# Patient Record
Sex: Female | Born: 1980
Health system: Southern US, Community
[De-identification: ages and names within clinical notes are randomized; demographics above are authoritative.]

## PROBLEM LIST (undated history)

## (undated) DIAGNOSIS — R200 Anesthesia of skin: Secondary | ICD-10-CM

## (undated) DIAGNOSIS — F32A Depression, unspecified: Secondary | ICD-10-CM

## (undated) DIAGNOSIS — D219 Benign neoplasm of connective and other soft tissue, unspecified: Secondary | ICD-10-CM

## (undated) DIAGNOSIS — G35D Multiple sclerosis, unspecified: Secondary | ICD-10-CM

## (undated) DIAGNOSIS — K219 Gastro-esophageal reflux disease without esophagitis: Secondary | ICD-10-CM

## (undated) DIAGNOSIS — R5381 Other malaise: Secondary | ICD-10-CM

## (undated) DIAGNOSIS — F329 Major depressive disorder, single episode, unspecified: Secondary | ICD-10-CM

## (undated) DIAGNOSIS — F419 Anxiety disorder, unspecified: Secondary | ICD-10-CM

## (undated) DIAGNOSIS — N83209 Unspecified ovarian cyst, unspecified side: Secondary | ICD-10-CM

## (undated) DIAGNOSIS — I517 Cardiomegaly: Secondary | ICD-10-CM

## (undated) DIAGNOSIS — N921 Excessive and frequent menstruation with irregular cycle: Secondary | ICD-10-CM

## (undated) DIAGNOSIS — G35 Multiple sclerosis: Secondary | ICD-10-CM

## (undated) DIAGNOSIS — E669 Obesity, unspecified: Secondary | ICD-10-CM

## (undated) DIAGNOSIS — IMO0001 Reserved for inherently not codable concepts without codable children: Secondary | ICD-10-CM

## (undated) DIAGNOSIS — I38 Endocarditis, valve unspecified: Secondary | ICD-10-CM

## (undated) DIAGNOSIS — R7989 Other specified abnormal findings of blood chemistry: Secondary | ICD-10-CM

## (undated) DIAGNOSIS — R5383 Other fatigue: Secondary | ICD-10-CM

## (undated) DIAGNOSIS — R1110 Vomiting, unspecified: Secondary | ICD-10-CM

## (undated) HISTORY — DX: Excessive and frequent menstruation with irregular cycle: N92.1

## (undated) HISTORY — PX: LUMBAR DISC SURGERY: SHX700

## (undated) HISTORY — DX: Reserved for inherently not codable concepts without codable children: IMO0001

## (undated) HISTORY — DX: Other specified abnormal findings of blood chemistry: R79.89

## (undated) HISTORY — DX: Benign neoplasm of connective and other soft tissue, unspecified: D21.9

## (undated) HISTORY — DX: Anesthesia of skin: R20.0

## (undated) HISTORY — DX: Multiple sclerosis, unspecified: G35.D

## (undated) HISTORY — DX: Other malaise: R53.81

## (undated) HISTORY — DX: Multiple sclerosis: G35

## (undated) HISTORY — DX: Vomiting, unspecified: R11.10

## (undated) HISTORY — DX: Gastro-esophageal reflux disease without esophagitis: K21.9

## (undated) HISTORY — DX: Obesity, unspecified: E66.9

## (undated) HISTORY — DX: Unspecified ovarian cyst, unspecified side: N83.209

## (undated) HISTORY — DX: Other fatigue: R53.83

---

## 2001-10-01 ENCOUNTER — Emergency Department (HOSPITAL_COMMUNITY): Admission: EM | Admit: 2001-10-01 | Discharge: 2001-10-01 | Payer: Self-pay | Admitting: *Deleted

## 2002-03-12 ENCOUNTER — Encounter: Payer: Self-pay | Admitting: Emergency Medicine

## 2002-03-12 ENCOUNTER — Emergency Department (HOSPITAL_COMMUNITY): Admission: EM | Admit: 2002-03-12 | Discharge: 2002-03-12 | Payer: Self-pay | Admitting: Emergency Medicine

## 2002-03-27 ENCOUNTER — Encounter: Payer: Self-pay | Admitting: Obstetrics & Gynecology

## 2002-03-27 ENCOUNTER — Inpatient Hospital Stay (HOSPITAL_COMMUNITY): Admission: EM | Admit: 2002-03-27 | Discharge: 2002-03-28 | Payer: Self-pay | Admitting: *Deleted

## 2002-05-01 ENCOUNTER — Ambulatory Visit (HOSPITAL_COMMUNITY): Admission: RE | Admit: 2002-05-01 | Discharge: 2002-05-01 | Payer: Self-pay | Admitting: Neurology

## 2002-05-14 ENCOUNTER — Ambulatory Visit (HOSPITAL_COMMUNITY): Admission: RE | Admit: 2002-05-14 | Discharge: 2002-05-14 | Payer: Self-pay | Admitting: Neurology

## 2002-05-26 ENCOUNTER — Encounter: Admission: RE | Admit: 2002-05-26 | Discharge: 2002-08-24 | Payer: Self-pay | Admitting: Neurology

## 2002-06-16 ENCOUNTER — Emergency Department (HOSPITAL_COMMUNITY): Admission: EM | Admit: 2002-06-16 | Discharge: 2002-06-16 | Payer: Self-pay | Admitting: Emergency Medicine

## 2002-07-11 ENCOUNTER — Emergency Department (HOSPITAL_COMMUNITY): Admission: EM | Admit: 2002-07-11 | Discharge: 2002-07-12 | Payer: Self-pay | Admitting: Emergency Medicine

## 2002-08-25 ENCOUNTER — Encounter: Admission: RE | Admit: 2002-08-25 | Discharge: 2002-10-09 | Payer: Self-pay | Admitting: Neurology

## 2003-02-16 ENCOUNTER — Emergency Department (HOSPITAL_COMMUNITY): Admission: EM | Admit: 2003-02-16 | Discharge: 2003-02-16 | Payer: Self-pay | Admitting: Emergency Medicine

## 2003-02-16 ENCOUNTER — Encounter: Payer: Self-pay | Admitting: Emergency Medicine

## 2003-02-18 ENCOUNTER — Emergency Department (HOSPITAL_COMMUNITY): Admission: EM | Admit: 2003-02-18 | Discharge: 2003-02-18 | Payer: Self-pay | Admitting: *Deleted

## 2003-04-27 ENCOUNTER — Encounter: Payer: Self-pay | Admitting: Neurology

## 2003-04-27 ENCOUNTER — Ambulatory Visit: Admission: RE | Admit: 2003-04-27 | Discharge: 2003-04-27 | Payer: Self-pay | Admitting: Neurology

## 2004-10-23 HISTORY — PX: BACK SURGERY: SHX140

## 2004-10-23 HISTORY — PX: CYST EXCISION: SHX5701

## 2005-09-19 ENCOUNTER — Emergency Department (HOSPITAL_COMMUNITY): Admission: EM | Admit: 2005-09-19 | Discharge: 2005-09-19 | Payer: Self-pay | Admitting: Emergency Medicine

## 2005-10-10 ENCOUNTER — Ambulatory Visit (HOSPITAL_COMMUNITY): Admission: RE | Admit: 2005-10-10 | Discharge: 2005-10-10 | Payer: Self-pay | Admitting: Neurosurgery

## 2005-11-07 ENCOUNTER — Encounter (HOSPITAL_COMMUNITY): Admission: RE | Admit: 2005-11-07 | Discharge: 2005-12-07 | Payer: Self-pay | Admitting: Neurosurgery

## 2006-07-20 ENCOUNTER — Emergency Department (HOSPITAL_COMMUNITY): Admission: EM | Admit: 2006-07-20 | Discharge: 2006-07-21 | Payer: Self-pay | Admitting: Emergency Medicine

## 2007-12-26 ENCOUNTER — Ambulatory Visit (HOSPITAL_COMMUNITY): Admission: RE | Admit: 2007-12-26 | Discharge: 2007-12-26 | Payer: Self-pay | Admitting: Family Medicine

## 2008-02-25 ENCOUNTER — Ambulatory Visit (HOSPITAL_COMMUNITY): Admission: RE | Admit: 2008-02-25 | Discharge: 2008-02-25 | Payer: Self-pay | Admitting: Neurology

## 2008-07-21 ENCOUNTER — Encounter (HOSPITAL_COMMUNITY): Admission: RE | Admit: 2008-07-21 | Discharge: 2008-08-20 | Payer: Self-pay | Admitting: Family Medicine

## 2008-07-28 ENCOUNTER — Ambulatory Visit: Payer: Self-pay | Admitting: Gastroenterology

## 2008-08-05 ENCOUNTER — Ambulatory Visit (HOSPITAL_COMMUNITY): Admission: RE | Admit: 2008-08-05 | Discharge: 2008-08-05 | Payer: Self-pay | Admitting: Gastroenterology

## 2008-08-05 ENCOUNTER — Encounter: Payer: Self-pay | Admitting: Gastroenterology

## 2008-08-05 ENCOUNTER — Ambulatory Visit: Payer: Self-pay | Admitting: Gastroenterology

## 2008-08-05 HISTORY — PX: ESOPHAGOGASTRODUODENOSCOPY: SHX1529

## 2008-09-21 ENCOUNTER — Ambulatory Visit (HOSPITAL_COMMUNITY): Admission: RE | Admit: 2008-09-21 | Discharge: 2008-09-21 | Payer: Self-pay | Admitting: General Surgery

## 2008-09-21 ENCOUNTER — Encounter (INDEPENDENT_AMBULATORY_CARE_PROVIDER_SITE_OTHER): Payer: Self-pay | Admitting: General Surgery

## 2008-09-23 ENCOUNTER — Ambulatory Visit: Payer: Self-pay | Admitting: Gastroenterology

## 2008-10-23 HISTORY — PX: CHOLECYSTECTOMY: SHX55

## 2010-09-01 ENCOUNTER — Ambulatory Visit: Payer: Self-pay | Admitting: Gastroenterology

## 2010-09-01 ENCOUNTER — Encounter (INDEPENDENT_AMBULATORY_CARE_PROVIDER_SITE_OTHER): Payer: Self-pay

## 2010-09-01 DIAGNOSIS — K219 Gastro-esophageal reflux disease without esophagitis: Secondary | ICD-10-CM

## 2010-09-01 DIAGNOSIS — K5909 Other constipation: Secondary | ICD-10-CM | POA: Insufficient documentation

## 2010-09-07 ENCOUNTER — Encounter: Payer: Self-pay | Admitting: Gastroenterology

## 2010-09-14 ENCOUNTER — Ambulatory Visit (HOSPITAL_COMMUNITY): Admission: RE | Admit: 2010-09-14 | Discharge: 2010-09-14 | Payer: Self-pay | Admitting: Gastroenterology

## 2010-09-14 ENCOUNTER — Ambulatory Visit: Payer: Self-pay | Admitting: Gastroenterology

## 2010-09-14 HISTORY — PX: COLONOSCOPY: SHX174

## 2010-09-16 ENCOUNTER — Encounter: Admission: RE | Admit: 2010-09-16 | Discharge: 2010-09-16 | Payer: Self-pay | Admitting: Neurology

## 2010-11-22 NOTE — Letter (Signed)
Summary: TCS ORDER  TCS ORDER   Imported By: Ave Filter 09/07/2010 14:36:49  _____________________________________________________________________  External Attachment:    Type:   Image     Comment:   External Document

## 2010-11-22 NOTE — Miscellaneous (Signed)
Summary: procedure note and path  Clinical Lists Changes NAME:  [name], [name]             ACCOUNT NO.:  [id number]      MEDICAL RECORD NO.:  [id number]          PATIENT TYPE:  AMB      LOCATION:  DAY                           FACILITY:  [facility]      PHYSICIAN:  [name], M.D.      DATE OF BIRTH:  1981-10-17      DATE OF PROCEDURE:  08/05/2008   DATE OF DISCHARGE:                                  OPERATIVE REPORT     REFERRING PHYSICIAN:  Patrica Duel, [name]      PROCEDURE:  Esophagogastroduodenoscopy with cold forceps biopsy.      INDICATIONS FOR EXAM:  [name] is a 30 year old female who came in   with complaints of uncontrolled gastroesophageal reflux disease.  She   has intermittently been on a PPI and has been using over-the-counter [employer] brand medication to try and control her symptoms.  She complains of   having heartburn, so severe that she will vomit.  She denies any   problems with swallowing.  She only has problems with nausea and   vomiting when she is having a bad episode of reflux.  She denies any   constipation or diarrhea.  She does not use aspirin, BCs, Goody powders,   ibuprofen, or Aleve.  She uses ibuprofen less than once a month.      FINDINGS:   1. Three linear erosions seen in the distal esophagus extending from       the gastroesophageal junction proximally.  The erosions were less       than 3 mm. She did have an incomplete Schatzki ring.   2. Small hiatal hernia.   3. Patchy erythema in the antrum with two erosions.  Biopsies obtained       via cold forceps to evaluate for Helicobacter pylori gastritis.   4. Streaky erythema in the duodenal bulb.  No ulcerations.   5. Normal ampulla and second portion of the duodenum.      DIAGNOSES:   1. Small hiatal hernia.   2. Erosive esophagitis (mild).   3. Gastritis, mild.   4. Duodenitis, mild.      RECOMMENDATIONS:   1. She should follow the [facility] lifestyle recommendations.  She       is  given a handout and that the handout was explained prior to her       sedation.   2. She should follow a low-fat diet.  I did emphasize to her that she       needs to lose at least 10-20 pounds.   3. Follow up appointment in 6 weeks with [name] regarding her       reflux.   4. Will call her with the results of her biopsies.   5. She is to take Prilosec 20 mg 30 minutes prior to breakfast and       supper.   6. No aspirin or NSAIDs for 30 days.   7. No anticoagulation for 5 days.   8. She  is given information on hiatal hernia.      MEDICATIONS:   1. Demerol 100 mg IV.   2. Versed 6 mg IV.      PROCEDURE TECHNIQUE:  Physical exam was performed.  Informed consent was   obtained from the patient after explaining the benefits, risks, and   alternatives to the procedure.  The patient was connected to monitor and   placed in left lateral position.  Continuous oxygen was provided by   nasal cannula.  IV medicine administered through an indwelling cannula.   After administration of sedation, the patient's esophagus was intubated   and the scope was advanced under direct visualization to the second   portion of the duodenum.  The scope was removed slowly by carefully   examining the color, texture, anatomy, and integrity of mucosa on the   way out.  The patient was recovered in endoscopy and discharged to home   in satisfactory condition.      PATH:  Benign gastric mucosa.               [name], M.D.   Electronically Signed            SM/MEDQ  D:  08/05/2008  T:  08/06/2008  Job:  [id number]      cc:   [name], M.D.   Fax: [phone]     SP-Surgical Pathology - STATUS: Final  .                                         Perform Date: 14Oct09 00:01  Ordered By: [name],          Ordered Date: 14Oct09 13:20  Facility: [facility]                               Department: CPATH  Service Report Text  [facility]   [address] [place],  [place] [address]   [phone]    REPORT OF SURGICAL PATHOLOGY    Case #: [id number]   Patient Name: [name], [name]   Office Chart Number: N/A    MRN: [id number]   Pathologist: Jessica Server A. Delila Spence, [name]   DOB/Age [birth year] (Age: 30) Gender: F   Date Taken: 08/05/2008   Date Received: 08/05/2008    FINAL DIAGNOSIS    ***MICROSCOPIC EXAMINATION AND DIAGNOSIS***    STOMACH, BIOPSY:   - BENIGN GASTRIC MUCOSA WITH A FEW SUPERFICIAL HEMORRHAGES   IN LAMINA PROPRIA.   - NO ACTIVE MUCOSAL INFLAMMATION, INTESTINAL METAPLASIA,   DYSPLASIA, OR MALIGNANCY   IDENTIFIED.   - NO HELICOBACTER PYLORI ORGANISMS IDENTIFIED.    COMMENT   A Warthin-Starry stain is performed to determine the possibility   of the presence of Helicobacter pylori. The Warthin-Starry stain   is negative for organisms of Helicobacter pylori. The control(s)   stained appropriately. (EA:jy) 08/06/08    jy   Date Reported: 08/06/2008 Jessica Server A. Delila Spence, [name]   *** Electronically Signed Out By EAA ***    Clinical information   H pylori; gastritis ? (km)    specimen(s) obtained   Stomach, biopsy    Gross Description   Received in formalin are tan, soft tissue fragments that are   submitted in toto. Number: five   Size: 0.2 cm up  to 0.4 cm (SP:mw, 08/05/08)    mw/   Additional Information  HL7 RESULT STATUS : F  External IF Update Timestamp : 2008-08-05:13:22:00.000000

## 2010-11-22 NOTE — Letter (Signed)
Summary: EGD ORDER  EGD ORDER   Imported By: Ave Filter 09/01/2010 11:33:20  _____________________________________________________________________  External Attachment:    Type:   Image     Comment:   External Document

## 2010-11-24 NOTE — Assessment & Plan Note (Signed)
Summary: CONSULT FOR EGD/SS   Visit Type:  Initial Consult Referring Provider:  Phillips Odor Primary Care Provider:  Phillips Odor  Chief Complaint:  Consult for EGD.  History of Present Illness: [name] is a pleasant 30 y/o WM, patient of Dr. Phillips Odor, who presents for consideration of EGD. We last saw her in 2009. She had EGD at that time, see PMH. She has NO recollection of having prior EGD. She states she took Nexium chronically without good control of her symptoms and eventually she lost her Medicaid and RX coverage and had to stop the med. She has been off PPI for one year. She has been taking ranitidine 2-3 per day and TUMS without relief.   Lots of heartburn with meals, even with water. Bad nocturnal symptoms, has to sit up while sleeping. No dysphagia. When belch, some regurgitation. No abd pain. BM once every two weeks. No meds. Recently started on 1200 cal diet and stools somewhat better. No melena. Some brbpr with straining, on tissue. Just started omeprazole 3 days ago, slight improvement.    Current Medications (verified): 1)  Omeprazole 40 Mg Cpdr (Omeprazole) .... Take 1 Tablet By Mouth Two Times A Day 2)  Copaxone 20 Mg/ml Kit (Glatiramer Acetate) .... One Injection Daily  Allergies (verified): 1)  ! Darvocet  Past History:  Past Medical History: EGD, 10/09, Dr. Darrick Penna Small hiatal hernia.  Erosive esophagitis (mild). Incomplete Schatzki's ring. Gastritis, mild.     Duodenitis, mild.  MS, dx 2003, ongoing blurred vision, numbness in right leg, hands  Past Surgical History: Cholecystectomy, 11/09, biliary dyskinesia Back surgery, 12/06 C-section  Family History: Significant for grandmother, deceased, history of uterine, lung, and colon cancer.  Colon cancer diagnosed at age 75.  Her father, age 53, has diabetes and history of MI (5).      Social History: She is single.  She has a son.  She is unemployed/quit one year ago due to MS. Trying to get disability.  She  smokes about half-a-pack to one ppd cigarettes daily. She denies any alcohol use or drug use.      Review of Systems General:  Denies fever, chills, sweats, anorexia, fatigue, weakness, and weight loss. Eyes:  Denies vision loss. ENT:  Denies sore throat, hoarseness, and difficulty swallowing. CV:  Denies chest pains, angina, palpitations, dyspnea on exertion, and peripheral edema. Resp:  Denies dyspnea at rest, dyspnea with exercise, cough, sputum, and wheezing. GI:  See HPI. GU:  Denies urinary burning and blood in urine. MS:  Denies joint pain / LOM and low back pain. Derm:  Denies rash and itching. Neuro:  Complains of abnormal sensation; denies weakness, frequent headaches, memory loss, and confusion. Psych:  Denies depression and anxiety. Endo:  Denies unusual weight change. Heme:  Denies bruising and bleeding. Allergy:  Denies hives and rash.  Vital Signs:  Patient profile:   30 year old female Height:      69 inches Weight:      254 pounds BMI:     37.64 Temp:     98.5 degrees F oral Pulse rate:   76 / minute BP sitting:   100 / 80  (left arm) Cuff size:   large  Vitals Entered By: Cloria Spring LPN (September 01, 2010 10:43 AM)  Physical Exam  General:  Well developed, well nourished, no acute distress.obese.   Head:  Normocephalic and atraumatic. Eyes:  Conjunctivae pink, no scleral icterus.  Mouth:  Oropharyngeal mucosa moist, pink.  No lesions, erythema or  exudate.    Neck:  Supple; no masses or thyromegaly. Lungs:  Clear throughout to auscultation. Heart:  Regular rate and rhythm; no murmurs, rubs,  or bruits. Abdomen:  Obese. Positive BS. Soft. NT. No HSM or masses. No abd bruit or hernia.  Extremities:  No clubbing, cyanosis, edema or deformities noted. Neurologic:  Alert and  oriented x4;  grossly normal neurologically. Skin:  Intact without significant lesions or rashes. Cervical Nodes:  No significant cervical adenopathy. Psych:  Alert and cooperative.  Normal mood and affect.  Impression & Recommendations:  Problem # 1:  GERD (ICD-530.81)  Chronic GERD with h/o erosive reflux esophagitis on prior EGD. H.Pylori negataive on bx then and at PCP recently. Symptoms refractory to Nexium before, ranitidine, TUMS. She was not aware of previous EGD. We discussed options of ongoing PPI therapy and see where her symptoms are in next one month but given her previous failure to Nexium already, she desires repeat EGD at this time. EGD to be performed in near future.  Risks, alternatives, benefits including but not limited to risk of reaction to medications, bleeding, infection, and perforation addressed.  Patient voiced understanding and verbal consent obtained.   Orders: Consultation Level III (16109)  Problem # 2:  CONSTIPATION, CHRONIC (ICD-564.09)  Encourage increased dietary fiber, water consumption, daily excercise. She may begin fiber supplement and stool softner (colace or miralax). Patient wants to try and control with dietary measures only at this time. Suspect occasional brbpr on toilet tissue due to straining (benign anorecal source).   Orders: Consultation Level III (60454) I would like to thank Dr. Assunta Found for allowing [place] to take part in the care of this nice patient.   Appended Document: CONSULT FOR EGD/SS Recent EGD 2009. She stopped her PPI, gained weight, and continues to smoke. No need for EGD. Pt needs to modify her risk factors: lose weight, stop smoking, follow a low fat diet, and take her PPI 30 minutes prior to meals two times a day. Called pt's home to discuss & will call her cell-[phone].  Appended Document: CONSULT FOR EGD/SS call pt's alternative number-LVM. Would perform TCS for rectal bleeding.  Appended Document: CONSULT FOR EGD/SS I called pt to schedule tcs, per family member pt not home but she will be there around 10am.  Appended Document: CONSULT FOR EGD/SS Called pt. LVM w/ mother-call regarding her  procedure.  Appended Document: CONSULT FOR EGD/SS Spoke with pt. Explained pt does not need an EGD but should continue to work on lifestyle modification. Pt currently losing weight due to a 1200 calorie diet. Continues to smoke and is taking a PPI. Pt agreed to TCS and would like one prior to Thanksgiving. Requested the Osmoprep because she cannot drink the liquid. TCS for rectal bleeding on NOV 23. Contact pt at [phone].  Appended Document: CONSULT FOR EGD/SS Appt changed to tcs and instructions along with rx mailed to the pt..Pt contacted with the change.

## 2011-03-07 NOTE — Consult Note (Signed)
[name], [name]             ACCOUNT NO.:  [id number]   MEDICAL RECORD NO.:  [id number]          PATIENT TYPE:  AMB   LOCATION:  DAY                           FACILITY:  [facility]   PHYSICIAN:  Kassie Mends, M.D.      DATE OF BIRTH:  05/31/1981   DATE OF CONSULTATION:  DATE OF DISCHARGE:                                 CONSULTATION   PHYSICIAN REQUESTING CONSULTATION:  Patrica Duel, MD   REASON FOR CONSULTATION:  Indigestion and nausea.   HISTORY OF PRESENT ILLNESS:  Ms. Foulk is a very pleasant 30 year old  obese Caucasian female who presents today for a 9-year history of  indigestion, heartburn, and nausea.  She states ever since she was  pregnant with her son, she has been having daily heartburn, nocturnal  reflux, nausea, and intermittent vomiting.  This has been going on for  about 9 years.  She has used various over-the-counter agents.  She  states she took Nexium for about 4-5 days once and did not seemed to  help.  She also took another PPI given to her by Dr. Nobie Putnam, which she  can not remember the name.  She states that none of these have really  provided her with any relief.  She denies any abdominal pain, dysphagia,  or odynophagia.  Her bowel movements are irregular, usually twice per  week.  Denies any blood in the stool or melena.  Her weight has been  stable.  She recently had a gallbladder workup including abdominal  ultrasound, which was unremarkable.  She had a HIDA scan, which revealed  a mildly decreased gallbladder ejection fraction of 34.8%.  She,  however, did not experience any symptoms; however, the only symptom she  experiences after ingestion of having half was that of indigestion and  heartburn.  She tells me that they are planning on taking her  gallbladder out.   CURRENT MEDICATIONS:  Chantix daily and Copaxone injections daily for  MS.   ALLERGIES:  DARVOCET.   PAST MEDICAL HISTORY:  Multiple sclerosis diagnosed in 2003.   PAST SURGICAL  HISTORY:  She has had a back surgery, cesarean section,  and cyst removed.   FAMILY HISTORY:  Significant for grandmother who has had a history of  uterine, lung, and colon cancer.  Colon cancer diagnosed at age 39.  Her  father has diabetes and history of MI.   SOCIAL HISTORY:  She is single.  She has a son.  She is unemployed.  She  smokes about half-a-pack cigarettes daily, cutting back from 2 packs  daily recently.  She denies any alcohol use.   REVIEW OF SYSTEMS:  GI:  See HPI.  CONSTITUTIONAL:  Denies any weight  loss.  CARDIOPULMONARY:  Denies chest pain, shortness of breath,  palpitations, or cough.  GENITOURINARY:  Denies dysuria or hematuria.   PHYSICAL EXAMINATION:  VITAL SIGNS:  Weight 240, height 5 feet 9, temp  97.9, blood pressure 108/80, pulse 60.  GENERAL:  Pleasant, obese Caucasian female in no acute distress.  SKIN:  Warm and dry.  No jaundice.  HEENT:  Sclerae nonicteric.  Oropharyngeal mucosa moist and pink.  No  lesions, erythema, or exudate.  No lymphadenopathy or thyromegaly.  CHEST:  Lungs are clear to auscultation.  CARDIAC:  Regular rate and rhythm.  Normal S1 and S2.  No murmurs, rubs,  or gallops.  ABDOMEN:  Positive bowel sounds.  Abdomen soft, nontender, and  nondistended.  No organomegaly or masses.  No rebound or guarding.  No  abdominal bruits or hernias.  LOWER EXTREMITIES:  No edema.   IMPRESSION:  The patient is a very pleasant 30 year old lady with a 9-  year history of indigestion, heartburn, and nausea with intermittent  vomiting.  Symptoms are likely related to chronic gastroesophageal  reflux disease.  She also has mild constipation.  The patient tells me  that she will be having her gallbladder removed in the near future.  I  did advise her that I would recommend upper endoscopy prior to that.  It  may be that most of her symptoms are related to her reflux, and she may  be able to postpone cholecystectomy for now.   PLAN:  1. EGD in  the near future with Dr. Cira Servant.  2. MiraLax 17 g daily as needed.  3. Prilosec 20 mg p.o. daily, 14 samples provided.   I would like to thank Dr. Nobie Putnam for allowing [place] to take part in the  care of this patient.      Tana Coast, P.A.      Kassie Mends, M.D.  Electronically Signed    LL/MEDQ  D:  07/28/2008  T:  07/29/2008  Job:  [id number]   cc:   Dorita Fray., M.D.  Fax: [phone]   Patrica Duel, M.D.  Fax: [phone]

## 2011-03-07 NOTE — H&P (Signed)
NAMESUSSAN, METER             ACCOUNT NO.:  [id number]   MEDICAL RECORD NO.:  [id number]          PATIENT TYPE:  AMB   LOCATION:  DAY                           FACILITY:  [facility]   PHYSICIAN:  Tilford Pillar, MD      DATE OF BIRTH:  20-Aug-1981   DATE OF ADMISSION:  DATE OF DISCHARGE:  LH                              HISTORY & PHYSICAL   CHIEF COMPLAINT:  Right upper quadrant abdominal pain.   HISTORY OF PRESENT ILLNESS:  The patient is a 30 year old female with a  history of multiple sclerosis and a history of gastroesophageal reflux  disease, who presented to my office approximately 1 month ago with a  history of right upper quadrant abdominal pain.  Her symptoms increased  after eating, although she does have poor correlation with fatty greasy  foods.  There is no particular food that does seem to increase the pain.  The pain is intermittent and occasionally retrosternal in distribution.  She has had no history of fever or chills.  No nausea and vomiting.  No  history of jaundice.  The patient does state she has had a previous  upper endoscopy, which demonstrated hiatal hernia.  No evidence of any  ulcerations were noted at that time.  She was also started on Prilosec  at that time.  No significant family history.   PAST MEDICAL HISTORY:  History of TMJ, gastroesophageal reflux disease.  The patient has had a history of multiple sclerosis.   PAST SURGICAL HISTORY:  Cesarean section.  The patient has had previous  back surgery and the patient has had excision of a pilonidal cyst.   MEDICATION:  Prilosec.   ALLERGIES:  DARVOCET.   SOCIAL HISTORY:  She is a half a pack per day smoker.  No alcohol use.  No recreational drug use.   REVIEW OF SYSTEMS:  GENERAL:  Occasional headaches.  EYES:  Unremarkable.  EARS, NOSE, AND THROAT:  Occasional rhinorrhea and  sinusitis. PULMONARY:  Unremarkable.  CARDIOVASCULAR:  Unremarkable.  GASTROINTESTINAL:  As per HPI, otherwise  unremarkable.  GENITOURINARY:  Unremarkable.  MUSCULOSKELETAL:  Arthralgias of the back.  ENDOCRINE:  Unremarkable.  NEURO:  Unremarkable.  SKIN:  Unremarkable.   PHYSICAL EXAMINATION:  GENERAL:  The patient is an obese female.  She is  not in any acute distress.  She is alert and oriented x3.  HEENT:  Scalp; no deformities, no masses.  Pupils are equal, round, and  reactive.  Extraocular movements are intact.  No conjunctival pallor or  scleral icterus is noted.  Oral mucosa is pink.  Normal occlusion.  NECK:  Trachea is midline.  No cervical lymphadenopathy.  PULMONARY:  Unlabored respirations.  She is clear to auscultation  bilaterally.  No wheezes.  No crackles are apparent.  CARDIOVASCULAR:  Regular rate and rhythm.  No murmurs.  No gallops are apparent.  She has  2+ radial pulses bilaterally.  ABDOMEN:  Her abdomen is obese, soft.  No  masses.  She does have some mild epigastric abdominal pain.  No Eulah Pont  sign is elicited.  EXTREMITIES:  Warm and dry.  PERTINENT LABORATORY AND RADIOGRAPHIC STUDIES:  The patient did have a  previous right upper quadrant ultrasound, which was unremarkable.  She  had above-mentioned upper endoscopy, which demonstrated a hiatal hernia.  Additionally, the patient did have a HIDA scan demonstrating a biliary  ejection fraction of 34.8%.  With the scan, the patient did have some  nausea with the ingestion of the oral cream; however, no exacerbation of  her pain symptoms occurred at that time.   ASSESSMENT AND PLAN:  Biliary dyskinesia.  At this time, I had a long  discussion with the patient regarding her symptomatology.  I do feel  that the majority of her symptoms are likely biliary in nature, although  her HIDA scan was borderline from an ejection fracture standpoint and  did not exacerbate all of her symptomatology.  Based on that however, I  did discuss with the patient other etiologies of her pain.  She has  already had endoscopy, which did not  demonstrate any gastrointestinal  etiology.  She has been on a course of Prilosec with no improvement of  her symptoms.  From the standpoint of right upper quadrant pain, she has  had some resolution of her reflux with the Prilosec and I recommended  continuing on that.  I did discuss again with the patient the risks,  benefits, alternatives of laparoscopic possible open cholecystectomy  including, but not limited to risk of bleeding, infection, bile leak,  small-bowel injury, common bile duct injury as well as possibility of  intraoperative cardiac and pulmonary events.  At this time, the patient  will be scheduled at her earliest convenience.  I did discuss with the  patient that should her operation be scheduled after the Thanksgiving  holiday, to be extremely careful in her diet at the holiday as not to  exacerbate her symptoms.      Tilford Pillar, MD  Electronically Signed     BZ/MEDQ  D:  09/10/2008  T:  09/11/2008  Job:  [id number]   cc:   Patrica Duel, M.D.  Fax: [phone]

## 2011-03-07 NOTE — Op Note (Signed)
[name], [name]             ACCOUNT NO.:  [id number]   MEDICAL RECORD NO.:  [id number]          PATIENT TYPE:  AMB   LOCATION:  DAY                           FACILITY:  [facility]   PHYSICIAN:  Kassie Mends, M.D.      DATE OF BIRTH:  Jul 02, 1981   DATE OF PROCEDURE:  08/05/2008  DATE OF DISCHARGE:                               OPERATIVE REPORT   REFERRING PHYSICIAN:  Patrica Duel, MD   PROCEDURE:  Esophagogastroduodenoscopy with cold forceps biopsy.   INDICATIONS FOR EXAM:  [name] is a 30 year old female who came in  with complaints of uncontrolled gastroesophageal reflux disease.  She  has intermittently been on a PPI and has been using over-the-counter [employer] brand medication to try and control her symptoms.  She complains of  having heartburn, so severe that she will vomit.  She denies any  problems with swallowing.  She only has problems with nausea and  vomiting when she is having a bad episode of reflux.  She denies any  constipation or diarrhea.  She does not use aspirin, BCs, Goody powders,  ibuprofen, or Aleve.  She uses ibuprofen less than once a month.   FINDINGS:  1. Three linear erosions seen in the distal esophagus extending from      the gastroesophageal junction proximally.  The erosions were less      than 3 mm. She did have an incomplete Schatzki ring.  2. Small hiatal hernia.  3. Patchy erythema in the antrum with two erosions.  Biopsies obtained      via cold forceps to evaluate for Helicobacter pylori gastritis.  4. Streaky erythema in the duodenal bulb.  No ulcerations.  5. Normal ampulla and second portion of the duodenum.   DIAGNOSES:  1. Small hiatal hernia.  2. Erosive esophagitis (mild).  3. Gastritis, mild.  4. Duodenitis, mild.   RECOMMENDATIONS:  1. She should follow the [facility] lifestyle recommendations.  She      is given a handout and that the handout was explained prior to her      sedation.  2. She should follow a low-fat  diet.  I did emphasize to her that she      needs to lose at least 10-20 pounds.  3. Follow up appointment in 6 weeks with Dr. Cira Servant regarding her      reflux.  4. Will call her with the results of her biopsies.  5. She is to take Prilosec 20 mg 30 minutes prior to breakfast and      supper.  6. No aspirin or NSAIDs for 30 days.  7. No anticoagulation for 5 days.  8. She is given information on hiatal hernia.   MEDICATIONS:  1. Demerol 100 mg IV.  2. Versed 6 mg IV.   PROCEDURE TECHNIQUE:  Physical exam was performed.  Informed consent was  obtained from the patient after explaining the benefits, risks, and  alternatives to the procedure.  The patient was connected to monitor and  placed in left lateral position.  Continuous oxygen was provided by  nasal cannula.  IV  medicine administered through an indwelling cannula.  After administration of sedation, the patient's esophagus was intubated  and the scope was advanced under direct visualization to the second  portion of the duodenum.  The scope was removed slowly by carefully  examining the color, texture, anatomy, and integrity of mucosa on the  way out.  The patient was recovered in endoscopy and discharged to home  in satisfactory condition.   PATH:  Benign gastric mucosa.      Kassie Mends, M.D.  Electronically Signed     SM/MEDQ  D:  08/05/2008  T:  08/06/2008  Job:  [id number]   cc:   Patrica Duel, M.D.  Fax: [phone]

## 2011-03-07 NOTE — Assessment & Plan Note (Signed)
[name], [name]              CHART#:  [id number]   DATE:  09/23/2008                       DOB:  1981-10-10   PROBLEM LIST:  1. Uncontrolled gastroesophageal reflux disease.  2. Tobacco abuse.  3. Severe obesity.  4. Multiple sclerosis.   SUBJECTIVE:  The patient is a 30 year old female, who presents to return  patient visit.  She was initially seen in October 2009 complaining of 9-  year history of indigestion, heartburn, nausea, and intermittent  vomiting.  She was started on Prilosec once daily.  Her symptoms were  not controlled when she was seen and evaluated by upper endoscopy.  Also  of note, she was 234 pounds in September 2009.  Because of her abdominal  pain and her nausea a HIDA scan was performed in September 2009, which  showed a decreased gallbladder ejection fraction at 34.8%.  Her symptoms  were not reproduced with half-and-half.  She had an upper endoscopy in  October 2009, which showed erosive esophagitis and mild gastritis as  well as duodenitis.  She had a small hiatal hernia.  Biopsies of the  gastric mucosa showed no evidence of H. pylori infection.  The Prilosec  was increased to twice daily.  She reports that the Prilosec were for  about a week and then it stopped working.  She reports having epigastric  discomfort on a daily basis.  It usually happens in the evening.  It  does not appear to be related to eating and the pain is not worse with  food.  She did have her gallbladder taken out on Monday and the  pathology showed mild cholecystitis.  She continues to smoke a pack a  day, but is on Chantix and trying to quit.  She can definitely commit to  following a low-fat diet.  She denies any problems swallowing, nausea,  or vomiting.   MEDICATIONS:  1. Chantix.  2. Copaxone.  3. Prilosec b.i.d.   OBJECTIVE:  VITAL SIGNS:  Weight 247 pounds (up 13 pounds since  September 2009), height 5 feet 9 inches, BMI 36.5 (severely obese),  temperature 98,  blood pressure 104/72, and pulse 88.GENERAL:  She is in  no apparent distress, but moves slowly when getting on the exam table  due to her recent surgery.  She is alert and oriented x4.  HEENT:  Atraumatic and normocephalic.  Pupils equal and reactive to  light.  Mouth demonstrates a pierced tongue with a tongue ring in place.  Posterior pharynx without erythema or exudate.NECK:  Full range of  motion.  No lymphadenopathy.LUNGS:  Clear to auscultation  bilaterally.CARDIOVASCULAR:  Regular rhythm.  No murmur.  Normal S1 and  S2.ABDOMEN:  Bowel sounds are present, soft, and nondistended.  Incisions on her abdomen with Steri-Strips in place.  No evidence of  erythema.  She has mild tenderness to palpation in the right upper  quadrant and right lower quadrant without rebound or  guarding.NEUROLOGIC:  She has no focal neurologic deficits.   ASSESSMENT:  The patient is a 30 year old female with severe obesity,  tobacco abuse, and symptoms of reflux that are uncontrolled on b.i.d.  Prilosec.  She has multiple lifestyle factors that are exacerbating her  symptoms to include tobacco abuse, obesity, and likely  a high fat diet.  Thank you for allowing me to see the patient  in consultation.  My  recommendations as follow.   RECOMMENDATIONS:  1. She is given the [facility] lifestyle recommendations and handout      and asked to follow them.  She is also given a handout on low-fat      diet and asked to follow it.  I did emphasize to her that it is a      good thing that she should stop smoking and it will help her reflux      and that she should lose weight.  2. I have given her samples of Nexium #10 and a prescription for      Nexium.  She is to take 1 p.o. 30 minutes before breakfast and      supper.  She was given #60 with 5 refills.  3. Will add Carafate slurry 1 g.  She is to take 1 g p.o. every 6      hours as needed for heartburn and indigestion, #30 dose, refill x5.  4. Follow up  appointment in 2 months.       Kassie Mends, M.D.  Electronically Signed     SM/MEDQ  D:  09/23/2008  T:  09/23/2008  Job:  [id number]   cc:   Patrica Duel, M.D.

## 2011-03-07 NOTE — Op Note (Signed)
NAMEMARETA, CHESNUT             ACCOUNT NO.:  [id number]   MEDICAL RECORD NO.:  [id number]          PATIENT TYPE:  AMB   LOCATION:  DAY                           FACILITY:  [facility]   PHYSICIAN:  Tilford Pillar, MD      DATE OF BIRTH:  [birth year]   DATE OF PROCEDURE:  09/21/2008  DATE OF DISCHARGE:                               OPERATIVE REPORT   PREOPERATIVE DIAGNOSIS:  Biliary dyskinesia.   POSTOPERATIVE DIAGNOSIS:  Biliary dyskinesia.   PROCEDURE:  Laparoscopic cholecystectomy.   SURGEON:  Tilford Pillar, MD   ANESTHESIA:  General endotracheal, local anesthetic, 0.5% Sensorcaine  plain.   SPECIMEN:  Gallbladder.   ESTIMATED BLOOD LOSS:  Minimal.   INDICATIONS:  The patient is a 30 year old female who presented to my  office with a history of epigastric and right upper quadrant abdominal  pain.  Not all of her symptomatology did consist of symptoms consistent  with biliary origin.  She had been on a regimen of Prilosec with no real  improvement of all of her symptomatology as well as had upper endoscopy.  She also had a HIDA scan, which demonstrated a 34% ejection fracture,  while did not exacerbate all were symptoms, she did have a enough  symptomatology with her exam that I am suspicious that not necessarily  100% of her symptoms are related, I do feel great majority of her  symptoms are related to biliary etiology.  The risks, benefits, and  alternatives of a laparoscopic possible open cholecystectomy were  discussed at length with the patient including but not limited to risk  of bleeding, infection, bile leak, small bowel injury, bile duct injury,  as well as a possibility of intraoperative cardiac or pulmonary events.  The patient's questions and concerns were dressed.  The patient was  consented for planned procedure.   OPERATION:  The patient was taken to the operating room and was placed  in the supine position on the operating room table, at which time, the  general anesthetic was administered.  Once the patient was asleep, she  was endotracheally intubated by the Anesthesia.  At this point, her  abdomen was prepped with DuraPrep solution and draped in standard  fashion.  A stab incision was created supraumbilically with an 11-blade  scalpel.  Additional dissection down through the subcuticular tissue was  carried out using a Kocher clamp.  It was utilized to grasp the anterior  abdominal wall fascia which is anteriorly.  A Veress needle was  inserted.  Saline drop test was utilized to confirm intraperitoneal  placement and then pneumoperitoneum was initiated.  Once sufficient  pneumoperitoneum was obtained, an 11-mm trocar was inserted over the  laparoscope allowing visualization of the trocar entering into the  peritoneal cavity.  At this point, the inner cannula was removed.  The  laparoscope was reinserted.  There was no evidence of any trocar or  Veress needle placement injury.  At this time, the patient was placed in  a reverse Trendelenburg left lateral decubitus position.  The remaining  trocars were placed in a similar fashion using a stab incision  and  placement of the trocars under visualization.  An 11-mm trocar was  placed in the epigastrium.  A 5-mm trocar was placed in the midline  between two 5-mm trocars, and a 5-mm trocar was placed in the right  lateral abdominal wall.  At this point, the fundus of the gallbladder  was lifted up and over the right lobe of the liver.  The peritoneal  reflection onto the infundibulum was bluntly stripped using a Recruitment consultant.  This exposed the cystic duct.  The cystic duct could clearly  be seen entering into the infundibulum.  A window was created behind the  cystic duct.  Similarly, a window was created behind the cystic artery  which was also visualized at this time.  The remainder of the clips were  placed proximally on the cystic duct, 1 distally and the cystic duct was  divided  between 2 most distal clips.  Similarly, the cystic artery had 2  clips placed proximally and 1 distally and the cystic artery was divided  between 2 most distal clips.  At this time, electrocautery was utilized  to dissect the gallbladder free from the gallbladder fossa.  When it was  free, it was placed in EndoCatch bag and it was placed up and over the  right lobe of the liver.  Hemostasis was obtained in the gallbladder  fossa using the electrocautery.  At this time, a piece of Surgicel was  placed into the gallbladder fossa and the EndoClips were evaluated with  no evidence of any bleeding or bile leak from any of the EndoClips.   At this time, attention was turned to closure.  Using an Endoclose  suture passing device, a 2-0 Vicryl suture was passed above the 11-mm  trocar sites with these Vicryl sutures in place.  The gallbladder was  grasped and was removed through the epigastric trocar site intact in the  EndoCatch bag.  This was placed on the back-table and was sent as a  permanent specimen to pathology.  At this time, pneumoperitoneum was  evacuated.  The trocars were removed.  The Vicryl sutures were secured.  The local anesthetic was instilled.  A 4-0 Monocryl was utilized to  reapproximate the skin edges at all 4 trocar sites.  The skin was washed  and dried with moist and dry towel.  Benzoin was applied around the  incision.  Half-inch Steri-Strips were placed.  The drapes were removed.  The patient was allowed to come out of general anesthetic and was  transferred back to the regular hospital bed.  She was transferred to  the postanesthetic care unit in stable condition.  At the conclusion of  the procedure, all instrument, sponge, and needle counts were correct.  The patient tolerated the procedure well.      Tilford Pillar, MD  Electronically Signed     BZ/MEDQ  D:  09/21/2008  T:  09/22/2008  Job:  [id number]   cc:   Patrica Duel, M.D.  Fax: [phone]

## 2011-03-10 NOTE — Op Note (Signed)
NAMEJERYL, [name]             ACCOUNT NO.:  [id number]   MEDICAL RECORD NO.:  [id number]          PATIENT TYPE:  OIB   LOCATION:  3020                         FACILITY:  [facility]   PHYSICIAN:  Reinaldo Meeker, M.D. DATE OF BIRTH:  Oct 27, 1980   DATE OF PROCEDURE:  10/10/2005  DATE OF DISCHARGE:  10/10/2005                                 OPERATIVE REPORT   PREOPERATIVE DIAGNOSIS:  Herniated disk, L4-5 right.   POSTOPERATIVE DIAGNOSIS:  Herniated disk, L4-5 right.   PROCEDURES:  1.  Right L4-5 intralaminar laminotomy for excision of herniated disk with      the operating microscope.  2.  Microdissection of L4-5 disk and L5 nerve root.   SURGEON:  Reinaldo Meeker, M.D.   ASSISTANT:  Kathaleen Maser. Pool, M.D.   PROCEDURE IN DETAIL:  After being placed in the prone position, the  patient's back was prepped and draped in the usual sterile fashion.  A  localizing x-ray was taken prior to incision to identify the appropriate  level.  A midline incision was made above the spinous processes of L4 and  L5.  Using the Bovie cutting current, the incision was carried down to the  status spinous processes.  Subperiosteal dissection was then carried out on  the right-sided spinous processes and laminae.  A self-retaining retractor  was placed for exposure.  X-ray showed approach to the appropriate level.  Using the high-speed drill, the inferior one-half of the L4 lamina and the  medial one-third of the facet joint were removed.  A drill was then used to  remove the superior one-half of the L5 lamina.  Residual bone and ligamentum  flavum were removed in a piecemeal fashion.  The microscope was draped,  brought into the field, and used for the remainder of the case.  Using  microdissection technique, the lateral aspect of the thecal sac and L5 nerve  root were identified.  Further coagulation was carried down to the floor of  the canal to identify the L4-5 disk, which was found to be markedly  herniated.  After coagulating down the annulus, the annulus was incised with  a 15 blade.  Using pituitary rongeurs and curettes, a thorough disk space  clean-out was carried out, while at the same time great care was taken to  avoid injury to the neural elements, which was successfully done.  At this  point inspection was carried out in all directions for any evidence of  residual compression, and none could be identified.  Large amounts of  irrigation were carried out.  Any bleeding was controlled with bipolar  coagulation and Gelfoam.  The wound was then closed in multiple layers of  Vicryl on the muscle, fascia, subcutaneous and subcuticular tissues and  staples on the skin.  A sterile dressing was then applied.  The patient was  then extubated and taken to the recovery room in stable condition.           ______________________________  Reinaldo Meeker, M.D.     ROK/MEDQ  D:  10/10/2005  T:  10/12/2005  Job:  [id number]

## 2011-03-10 NOTE — Discharge Summary (Signed)
[facility]  Patient:    [name], TALAMANTE Visit Number: [id number] MRN: [id number]          Service Type: MED Location: [id number] Attending Physician:  Lazaro Arms Dictated by:   Duane Lope, M.D. Admit Date:  03/26/2002 Discharge Date: 03/28/2002                             Discharge Summary  DISCHARGE DIAGNOSES: 1. Vestibular neuritis. 2. Nausea and vomiting. 3. Ruptured hemorrhagic corpus luteum cyst of right ovary.  PROCEDURES: 1. Admission management on March 27, 2002. 2. Discharge management on March 28, 2002.  HISTORY OF PRESENT ILLNESS:  Please ______ history and physical for details of admission to the hospital.  HOSPITAL COURSE:  The patient was admitted and placed on Levaquin IV for possible PID picture.  She was ruled out for appendicitis.  I obtained an ultrasound, which revealed a probable ruptured hemorrhagic corpus luteum on the right ovary.  She had some free fluid in the pelvis and a small complex cyst on that side.  Her main complaints were dizziness and nausea and vomiting.  She was given Antivert 25 mg q.6h. and prednisone 80 mg p.o. as well as the Levaquin.  By the morning of March 28, 2002, the patient was feeling much better, much less dizziness, no nausea and vomiting, and able to keep her supper and breakfast down.  As a result, she was discharged to home.  MEDICATIONS: 1. Antivert as needed. 2. Prednisone Dosepak 12-day, 5-mg taper. 3. Phenergan tablets as needed. 4. Levaquin 500 mg a day for 10 days.  FOLLOW-UP:  I will see her back in the office in one week.  DISCHARGE INSTRUCTIONS:  She was insured that it will take about a week, at least, for all of her dizziness to go away.  She was also given a note to excuse her from work for the past three days.  If she has any other questions or complaints, she will give me a call prior to being seen next week. Dictated by:   Duane Lope, M.D. Attending Physician:   Lazaro Arms DD:  03/28/02 TD:  03/31/02 Job: [id number]

## 2011-03-10 NOTE — H&P (Signed)
[facility]  Patient:    [name], [name] Visit Number: [id number] MRN: [id number]          Service Type: MED Location: 3A [phone] Attending Physician:  Corlis Leak. Dictated by:   Duane Lope, M.D. Admit Date:  03/26/2002                           History and Physical  HISTORY OF PRESENT ILLNESS:  The patient is a 30 year old, white female, G1, P1 with last menstrual period last week and no form of birth control with a negative HCG who was seen in the ER complaining about a 12-hour history of nausea, vomiting and head spinning.  The patient had been in normal good health and has not run a fever.  She states at about 12:30 p.m. on June 4, in she started having head spinning, nausea and vomiting.  She has thrown up several times.  The patient states she has never had pelvic inflammatory disease before and has no urinary symptoms.  She denies any type of vaginal discharge.  She was worked up through the ER and had a white count of 17,000 with a relatively unremarkable shift.  She had 73 segs which was within normal limits.  She had a CT scan that showed a right ovarian cyst and some free fluid in the pelvis, but no evidence of an appendicitis.  As a result, she is admitted with presumptive diagnoses of PID, possible pelvic abscess, vestibular neuritis.  PAST MEDICAL HISTORY:  Negative.  PAST SURGICAL HISTORY: 1. Pilonidal cyst. 2. C-section. 3. Wisdom teeth.  PAST OBSTETRIC HISTORY:  Cesarean section two years ago.  ALLERGY:  DARVOCET.  MEDICATIONS:  None.  PHYSICAL EXAMINATION:  HEENT:  Unremarkable.  Thyroid normal.  LUNGS:  Clear.  HEART:  Regular rate and rhythm without murmurs, rubs or gallops.  BREASTS:  Deferred.  ABDOMEN:  Relatively benign, minimal tenderness in the lower quadrants, no rebound.  PELVIC:  Not performed, although in the ER, she was stated to have positive cervical motion tenderness.  EXTREMITIES:   Warm with no edema.  NEUROLOGIC:  Grossly intact.  IMPRESSION: 1. Vestibular neuritis. 2. Possible pelvic inflammatory disease versus a right ovarian cyst.  PLAN: 1. The patient will be admitted. 2. She will be given IV antibiotics and clear liquids. 3. Will probably give her some Antivert and prednisone as well for vestibular    neuritis. 4. Will do serial exams and observation. Dictated by:   Duane Lope, M.D. Attending Physician:  Corlis Leak DD:  03/27/02 TD:  03/27/02 Job: [id number]

## 2011-03-10 NOTE — Consult Note (Signed)
NAME:  [name], [name]                       ACCOUNT NO.:  [id number]   MEDICAL RECORD NO.:  [id number]                   PATIENT TYPE:  EMS   LOCATION:  [facility]                                 FACILITY:  [facility]   PHYSICIAN:  Marlan Palau, M.D.               DATE OF BIRTH:  [birth year]   DATE OF CONSULTATION:  DATE OF DISCHARGE:  06/16/2002                              NEUROLOGY CONSULTATION   HISTORY OF PRESENT ILLNESS:  The patient is a 30 year old right-handed white  female born on [birth year] with a history of multiple sclerosis. This  patient was diagnosed by Dr. Vickey Huger in July 2003, after a two month  history of symptomatology of numbness on the right face, right hand and leg.  This patient had a normal MRI scan of the brain. She has had lumbar puncture  with positive oligoclonal bandings confirming the diagnosis of  MS. The  patient was  just recently started on Rebif and has been on this  for about  three weeks. The patient has had good resolution of right facial numbness  symptoms. Dr. Morrie Sheldon had recrudescence of her symptoms involving the right  face, unassociated with any other new findings such as weakness, speech  changes, swallowing problems, visual changes, gait disturbance. The patient  was  told to go to the emergency room tonight for an evaluation.   PAST MEDICAL HISTORY:  1. History of multiple sclerosis with recrudescence of symptoms.  2. Status post pilonidal cyst resection.  3. History of C-section.  4. History of wisdom teeth resection.  5. History of obesity.   MEDICATIONS:  1. Rebif injection subcu q.o.d.  2. Ambien if need for sleep.  3. Lexapro 10 mg q.d.  4. Prevacid 30 mg q.d.   SOCIAL HISTORY:  The patient smokes a pack of cigarettes  a day. Does not  drink alcohol. Denies any use of drugs.  The patient lives in the  [place] area. She works at [employer]. She is divorced. She has one child who is alive and well.   REVIEW OF  SYMPTOMS:  Notable for no fever or chills. She does note some  headache today. Denies visual field changes, swallowing problems, shortness  of breath, chest pain. Has had some slight  nausea and vomiting early  yesterday and the patient noted right facial numbness shortly after a bout  of nausea and vomiting. The patient denies any problems controlling the  bowels or bladder.   PHYSICAL EXAMINATION:  VITAL SIGNS:  Blood pressure 102/68, heart rate 80,  respirations 20, temperature afebrile.  GENERAL:  This patient is a moderately obese white female who is alert and  cooperative at the time of examination.  HEENT: Head is atraumatic. Eyes pupils equal, round and reactive to light.  Disks are flat bilaterally.  NECK: Supple, no carotid bruits.  RESPIRATORY:  Examination is clear to auscultation and percussion.  CARDIOVASCULAR:  Examination  revealed regular rate and rhythm without  obvious murmurs or rubs noted.  EXTREMITIES:  Without significant edema.  NEUROLOGIC:  Examination cranial nerves revealed facial symmetry is present.  The patient had some slight decrease in pinprick, especially on the right.  The face appeared left splits midline __________  sensation. The patient has  full extraocular movements, visual fields are full. Speech is well  enunciated. She had a motor test which revealed 5/5 strength in all fours.  Good symmetric motor tone is noted throughout. Sensory testing is notable  for some slight decrease in pinprick  sensation on the right leg compared to  the  left, otherwise Doppler sensation slightly depressed on the right foot  as  compared to the left and symmetric in the upper extremities.  Pinprick  sensation is symmetric in the upper extremities. The patient has good finger-  nose-finger, heel-to-shin, gait normal, tandem gait normal, Romberg negative  and no evidence of pronator drift is seen. Deep tendon reflexes remain  symmetric and normal with the exception of  4 to 5 beats of ankle clonus  noted on the right, not present on the left. Toes neutral  bilaterally.   IMPRESSION:  History of multiple sclerosis with recent exacerbation. This  patient really has subjective symptoms at this point. We will discharge the  patient home with prednisone dose pack 5 mg, 6 day pack.  The patient will  followup with Dr. Vickey Huger on an as needed basis. The patient will continue  the Rebif.                                               Marlan Palau, M.D.    CKW/MEDQ  D:  06/16/2002  T:  06/18/2002  Job:  [id number]   cc:   [facility]  [address]

## 2011-05-04 ENCOUNTER — Emergency Department (HOSPITAL_COMMUNITY): Payer: Self-pay

## 2011-05-04 ENCOUNTER — Encounter: Payer: Self-pay | Admitting: *Deleted

## 2011-05-04 ENCOUNTER — Emergency Department (HOSPITAL_COMMUNITY)
Admission: EM | Admit: 2011-05-04 | Discharge: 2011-05-04 | Disposition: A | Discharge status: Home / Self Care | Payer: Self-pay | Attending: Emergency Medicine | Admitting: Emergency Medicine

## 2011-05-04 ENCOUNTER — Other Ambulatory Visit: Payer: Self-pay

## 2011-05-04 DIAGNOSIS — R071 Chest pain on breathing: Secondary | ICD-10-CM | POA: Insufficient documentation

## 2011-05-04 DIAGNOSIS — F17200 Nicotine dependence, unspecified, uncomplicated: Secondary | ICD-10-CM | POA: Insufficient documentation

## 2011-05-04 DIAGNOSIS — R0789 Other chest pain: Secondary | ICD-10-CM

## 2011-05-04 DIAGNOSIS — G35 Multiple sclerosis: Secondary | ICD-10-CM | POA: Insufficient documentation

## 2011-05-04 DIAGNOSIS — I498 Other specified cardiac arrhythmias: Secondary | ICD-10-CM | POA: Insufficient documentation

## 2011-05-04 DIAGNOSIS — I10 Essential (primary) hypertension: Secondary | ICD-10-CM | POA: Insufficient documentation

## 2011-05-04 HISTORY — DX: Multiple sclerosis, unspecified: G35.D

## 2011-05-04 HISTORY — DX: Multiple sclerosis: G35

## 2011-05-04 LAB — DIFFERENTIAL
Basophils Absolute: 0 10*3/uL (ref 0.0–0.1)
Basophils Relative: 0 % (ref 0–1)
Eosinophils Relative: 5 % (ref 0–5)
Monocytes Absolute: 0.7 10*3/uL (ref 0.1–1.0)
Neutro Abs: 9.3 10*3/uL — ABNORMAL HIGH (ref 1.7–7.7)

## 2011-05-04 LAB — CBC
HCT: 36.2 % (ref 36.0–46.0)
MCHC: 33.4 g/dL (ref 30.0–36.0)
MCV: 76.5 fL — ABNORMAL LOW (ref 78.0–100.0)
RDW: 15.8 % — ABNORMAL HIGH (ref 11.5–15.5)

## 2011-05-04 LAB — BASIC METABOLIC PANEL
Calcium: 9.7 mg/dL (ref 8.4–10.5)
Creatinine, Ser: 0.63 mg/dL (ref 0.50–1.10)
GFR calc Af Amer: >60 mL/min (ref >60)

## 2011-05-04 LAB — CARDIAC PANEL(CRET KIN+CKTOT+MB+TROPI): Total CK: 68 U/L (ref 7–177)

## 2011-05-04 MED ORDER — OXYCODONE-ACETAMINOPHEN 5-325 MG PO TABS
1.0000 | ORAL_TABLET | ORAL | Status: AC | PRN
Start: 2011-05-04 — End: 2011-05-14

## 2011-05-04 MED ORDER — KETOROLAC TROMETHAMINE 30 MG/ML IJ SOLN
30.0000 mg | Freq: Once | INTRAMUSCULAR | Status: AC
  Administered 2011-05-04: 30 mg via INTRAVENOUS
  Filled 2011-05-04: qty 1

## 2011-05-04 MED ORDER — OXYCODONE-ACETAMINOPHEN 5-325 MG PO TABS
2.0000 | ORAL_TABLET | Freq: Once | ORAL | Status: AC
  Administered 2011-05-04: 2 via ORAL
  Filled 2011-05-04: qty 2

## 2011-05-04 MED ORDER — KETOROLAC TROMETHAMINE 30 MG/ML IJ SOLN
INTRAMUSCULAR | Status: AC
  Filled 2011-05-04: qty 1

## 2011-05-04 MED ORDER — IBUPROFEN 800 MG PO TABS: 800.0000 mg | ORAL_TABLET | Freq: Three times a day (TID) | ORAL | Status: AC

## 2011-05-04 NOTE — ED Notes (Signed)
Into room to see pt. Pain level is unchanged. Remains at 8/10. Denies any needs. Family at bs. Call bell at bs. MD made aware.

## 2011-05-04 NOTE — ED Notes (Signed)
Pt to radiology via stretcher.  

## 2011-05-04 NOTE — ED Notes (Signed)
MD at bs to evaluate.

## 2011-05-04 NOTE — ED Provider Notes (Addendum)
History     Chief Complaint  Patient presents with  . Chest Pain   HPI Comments: Pt complains of sharp CP that started at 9:30 while she was sitting on the couch - it radiates through to the back and is constant, wosre with deep breahting and not associated with fever or coughing.  She does have some nasuea and SOB and noted that it was difficulty to walk up stairs.  She has hx of MS but no DVT, OCP's, trauma, immobilization.    Patient is a 30 y.o. female presenting with chest pain.  Chest Pain Primary symptoms include shortness of breath. Pertinent negatives for primary symptoms include no fever, no cough, no wheezing, no palpitations and no dizziness.  Pertinent negatives for associated symptoms include no numbness and no weakness.     Past Medical History  Diagnosis Date  . Hypertension   . Multiple sclerosis     Past Surgical History  Procedure Date  . Cholecystectomy   . Back surgery     Family History  Problem Relation Age of Onset  . Heart attack Mother   . Heart attack Father     History  Substance Use Topics  . Smoking status: Current Everyday Smoker -- 1.0 packs/day  . Smokeless tobacco: Not on file  . Alcohol Use: No    OB History    Grav Para Term Preterm Abortions TAB SAB Ect Mult Living                  Review of Systems  Constitutional: Negative for fever and chills.  HENT: Negative for neck pain.   Respiratory: Positive for chest tightness and shortness of breath. Negative for cough and wheezing.   Cardiovascular: Positive for chest pain. Negative for palpitations and leg swelling.  Gastrointestinal: Negative for abdominal distention.  Genitourinary: Negative for difficulty urinating.  Musculoskeletal: Positive for back pain.  Neurological: Negative for dizziness, weakness and numbness.  Hematological: Negative for adenopathy.  All other systems reviewed and are negative.    Physical Exam  BP 124/84  Pulse 104  Temp(Src) 98.1 F (36.7  C) (Oral)  Resp 24  Ht 5\' 9"  (1.753 m)  Wt 250 lb (113.399 kg)  BMI 36.92 kg/m2  SpO2 98%  LMP 04/18/2011  Physical Exam  Constitutional: She appears well-developed and well-nourished. No distress.  HENT:  Head: Normocephalic and atraumatic.  Mouth/Throat: Oropharynx is clear and moist. No oropharyngeal exudate.  Eyes: Conjunctivae and EOM are normal. Pupils are equal, round, and reactive to light. Right eye exhibits no discharge. Left eye exhibits no discharge. No scleral icterus.  Neck: Normal range of motion. Neck supple. No JVD present. No thyromegaly present.  Cardiovascular: Normal rate, regular rhythm, normal heart sounds and intact distal pulses.  Exam reveals no gallop and no friction rub.   No murmur heard. Pulmonary/Chest: Effort normal and breath sounds normal. No respiratory distress. She has no wheezes. She has no rales.       Lungs clear, pulse 93 on my exam, has reproducible ttp in the R parasternal and L parasternal chest wall  Abdominal: Soft. Bowel sounds are normal. She exhibits no distension and no mass. There is no tenderness.  Musculoskeletal: Normal range of motion. She exhibits no edema and no tenderness.  Lymphadenopathy:    She has no cervical adenopathy.  Neurological: She is alert. Coordination normal.  Skin: Skin is warm and dry. No rash noted. She is not diaphoretic. No erythema.  Psychiatric: She has a  normal mood and affect. Her behavior is normal.    ED Course  Procedures  ED ECG REPORT   Date: 05/04/2011   Rate: 103  Rhythm: sinus tachycardia  QRS Axis: normal  Intervals: normal  ST/T Wave abnormalities: normal  Conduction Disutrbances:none  Narrative Interpretation:   Old EKG Reviewed: none available  No evidence of ischemia or pericarditis on ECG  MDM Pt has ongoing CP in the ED, VS normalized, ECG without findings and is young with only TOb use as rf for CAD.  Will get labs, dimer and toradol.  Chest x-ray reveals no focal  infiltrates pneumothoraces or soft tissue abnormalities.  I have discussed the findings with the patient including her blood work and chest x-ray. She has expressed her understanding of the results the treatment plan and the need for followup should her symptoms progress or worsen. At the time of discharge I have a very low suspicion that this is related to a cardiac source, pulmonary source, or blood clot.  Vida Roller, MD 05/04/11 [phone]  Patient tolerated Percocet in the emergency department without any side effects.  Vida Roller, MD 05/04/11 [phone]

## 2011-05-04 NOTE — ED Notes (Signed)
Started having sharp substernal chest pain that radiates up chest and through to back. Described as a constant pain. Has some shortness of breath. Breathing approx 22x\minute, regular. Placed on 2l o2 [place], ccm.

## 2011-05-04 NOTE — ED Notes (Signed)
Pt back to room from radiology

## 2011-05-04 NOTE — ED Notes (Signed)
States she has substernal chest pain that radiates up and radiates through to back. Sharp in nature and constant. Breathing in and palpation makes pain worse. Tender on palpation to middle chest. Nothing makes pain better. S1 and S2 present with no extra heart sounds. Breathing 24x\minute, regular, slightly labored. Placed on 2l o2 [place], ccm. Denies vomiting, diarrhea. States she is nauseated. No abd pain or tenderness.

## 2011-05-04 NOTE — ED Notes (Signed)
Into room to see pt. States pain level is 4/10. Denies any needs at this time. Nad. Call bell and family at bs. No sob. Equal and regular chest rise and fall; nonlabored. Will cont to monitor.

## 2011-07-25 LAB — BASIC METABOLIC PANEL
BUN: 9
Chloride: 105
Glucose, Bld: 85
Potassium: 4.3

## 2011-07-25 LAB — CBC
HCT: 37.3
MCV: 80.9
Platelets: 358
RDW: 15.6 — ABNORMAL HIGH
WBC: 11.3 — ABNORMAL HIGH

## 2011-07-25 LAB — HCG, QUANTITATIVE, PREGNANCY: hCG, Beta Chain, Quant, S: <2

## 2012-02-02 LAB — CBC WITH DIFFERENTIAL/PLATELET
Rhuematoid fact SerPl-aCnc: <10
TSH: 2.232
WBC: 12.7

## 2012-02-02 LAB — HEPATIC FUNCTION PANEL: ALT: 12 U/L (ref 7–35)

## 2012-02-02 LAB — BASIC METABOLIC PANEL
BUN: 11 mg/dL (ref 4–21)
Sodium: 139 mmol/L (ref 137–147)

## 2012-02-06 LAB — ANEMIA PANEL
%SAT: 4
Ferritin: 9 ng/mL (ref 9.0–150.0)
Folate: 3.6
Iron: 14

## 2012-02-22 ENCOUNTER — Ambulatory Visit (INDEPENDENT_AMBULATORY_CARE_PROVIDER_SITE_OTHER): Payer: Medicaid Other | Admitting: Urgent Care

## 2012-02-22 ENCOUNTER — Encounter: Payer: Self-pay | Admitting: Urgent Care

## 2012-02-22 VITALS — BP 104/68 | HR 71 | Temp 97.9°F | Ht 69.0 in | Wt 259.6 lb

## 2012-02-22 DIAGNOSIS — K5909 Other constipation: Secondary | ICD-10-CM

## 2012-02-22 DIAGNOSIS — F458 Other somatoform disorders: Secondary | ICD-10-CM

## 2012-02-22 DIAGNOSIS — K219 Gastro-esophageal reflux disease without esophagitis: Secondary | ICD-10-CM

## 2012-02-22 DIAGNOSIS — K921 Melena: Secondary | ICD-10-CM | POA: Insufficient documentation

## 2012-02-22 DIAGNOSIS — D649 Anemia, unspecified: Secondary | ICD-10-CM | POA: Insufficient documentation

## 2012-02-22 DIAGNOSIS — R0989 Other specified symptoms and signs involving the circulatory and respiratory systems: Secondary | ICD-10-CM | POA: Insufficient documentation

## 2012-02-22 DIAGNOSIS — R1310 Dysphagia, unspecified: Secondary | ICD-10-CM | POA: Insufficient documentation

## 2012-02-22 MED ORDER — ESOMEPRAZOLE MAGNESIUM 40 MG PO CPDR: 40.0000 mg | DELAYED_RELEASE_CAPSULE | Freq: Every day | ORAL | Status: DC

## 2012-02-22 MED ORDER — HYDROCORTISONE ACETATE 25 MG RE SUPP: 25.0000 mg | Freq: Two times a day (BID) | RECTAL | Status: DC

## 2012-02-22 NOTE — Assessment & Plan Note (Addendum)
[name] is a pleasant 31 y.o. female with globus sensation since recent skin rash ?possible allergic reaction to unknown source.  She also c/o GERD & solid food dysphagia.  I am not convinced that her globus sensation is secondary to her GI tract although it could be secondary to poorly controlled GERD & she may have dysphagia secondary to Schatzki's ring.  Underlying MS could be contributing factor, as could pharyngeal source.  Stop Zantac Begin Nexium 40mg  daily EGD w/ possible esophageal dilation Schatzki's ring by Dr Darrick Penna.  I have discussed risks & benefits which include, but are not limited to, bleeding, infection, perforation & drug reaction.  The patient agrees with this plan & written consent will be obtained.   Consider ENT evaluation if nothing to explain symptoms found on EGD

## 2012-02-22 NOTE — Progress Notes (Signed)
Referring Provider: Colette Ribas, MD Primary Care Physician:  Colette Ribas, MD, MD Primary Gastroenterologist:  Dr. Darrick Penna  Chief Complaint  Patient presents with  . lump in throat  . Rectal Bleeding    HPI:  [name] is a 31 y.o. female here for follow up for  "lump in her throat".  She tells me she was recently seen at [facility] ED for an allergic rxn of unknown source.  She broke out in maculopapular rash by her description to her anterior upper thighs & also felt like she could not swallow.  The pruritic rash was red.  She was started on prednisone, then rash spread to hands & feet.  Since that time, she has had globus sensation.  She also c/o a sensation of a "lump in her upper esophagus" when she tries to swallow.  C/o daily heartburn & indigestion unless she takes OTC medication.  She has not been taking prilosec as ordered by [employer], [facility].  Instead, she has been taking ranitidine OTC BID.  This seems to help.  She feels like solid foods get stuck in her upper esophagus.  No problems w/ liquids.  No new medications.  Has been on copaxone for 4 years for MS.  She tells me she started iron Saturday after being diagnosed w/ anemia.  C/o 2 wk hx bright red blood in stools & w/ wiping. Now dark since she started iron.  C/o menstrual cramps as she started period yesterday.  Last EGD by Dr Darrick Penna 07/2008 w/ incomplete Schatzki's ring and mild erosive esophagitis.  She has noticed scant amounts of bright red rectal bleeding on toilet tissue.  Takes stool softeners for constipation which seems to help.  After her office visit, I received labs from 02/02/12:  Hgb 11.9, Iron 14 & Ferritin 9, B12 211(low normal), folate 3.6 (indeterminate), TIBC & UIBC normal.  WBC 12.7.  CMP normal, ESR normal, ANA neg, RF normal, TSH normal, uric acid normal. Past Medical History  Diagnosis Date  . Hypertension   . Multiple sclerosis     Dr Wynonia Hazard  . GERD (gastroesophageal reflux  disease)     Past Surgical History  Procedure Date  . Cholecystectomy 2009  . Back surgery   . Cesarean section 2000  . Esophagogastroduodenoscopy 08/05/2008    Fields-incomplete Schatzki's ring, distal esophageal erosions/esophagitis(mild), small HH, NEGATIVE h pylori  . Colonoscopy 09/14/2010    Fields-hyperplastic polyps, int hemorrhoids    Current Outpatient Prescriptions  Medication Sig Dispense Refill  . docusate sodium (COLACE) 100 MG capsule Take 100 mg by mouth 2 (two) times daily.      . ferrous sulfate 325 (65 FE) MG tablet Take 325 mg by mouth daily with breakfast.      . glatiramer (COPAXONE) 20 MG/ML injection Inject 20 mg into the skin daily.        . ranitidine (ZANTAC) 75 MG tablet Take 75 mg by mouth 2 (two) times daily.      . varenicline (CHANTIX) 1 MG tablet Take 1 mg by mouth 2 (two) times daily.      Marland Kitchen esomeprazole (NEXIUM) 40 MG capsule Take 1 capsule (40 mg total) by mouth daily before breakfast.  31 capsule  5  . hydrocortisone (ANUSOL-HC) 25 MG suppository Place 1 suppository (25 mg total) rectally every 12 (twelve) hours.  20 suppository  0    Allergies as of 02/22/2012 - Review Complete 02/22/2012  Allergen Reaction Noted  . Propoxyphene-acetaminophen  Family History  Problem Relation Age of Onset  . Heart attack Mother   . Heart attack Father   . Colon cancer Paternal Uncle 61  . Colon cancer Paternal Grandmother 26  . Coronary artery disease Father    History   Social History  . Marital Status: Divorced    Spouse Name: N/A    Number of Children: 1  . Years of Education: N/A   Occupational History  . disabled    Social History Main Topics  . Smoking status: Former Smoker -- 1.0 packs/day for 20 years    Types: Cigarettes    Quit date: 02/15/2012  . Smokeless tobacco: Not on file  . Alcohol Use: No  . Drug Use: No  . Sexually Active: No   Other Topics Concern  . Not on file   Social History Narrative   Lives w/grandparents &  son  Review of Systems: Gen:c/o chronic fatigue & malaise CV: Denies chest pain, angina, palpitations, syncope, orthopnea, PND, peripheral edema, and claudication. Resp: Denies dyspnea at rest, dyspnea with exercise, cough, sputum, wheezing, coughing up blood, and pleurisy. GI: Denies vomiting blood, jaundice, and fecal incontinence.  Derm: Denies rash, itching, dry skin, hives, moles, warts, or unhealing ulcers.  Psych: Denies depression, anxiety, memory loss, suicidal ideation, hallucinations, paranoia, and confusion. Heme: Denies bruising, bleeding, and enlarged lymph nodes.  Physical Exam: BP 104/68  Pulse 71  Temp(Src) 97.9 F (36.6 C) (Temporal)  Ht 5\' 9"  (1.753 m)  Wt 259 lb 9.6 oz (117.754 kg)  BMI 38.34 kg/m2  LMP 02/21/2012 General:   Alert,  Well-developed, obese, pleasant and cooperative in NAD Eyes:  Sclera clear, no icterus.   Conjunctiva pink. Mouth:  No deformity or lesions, oropharynx pink and moist. Neck:  Supple; no masses or thyromegaly. Heart:  Regular rate and rhythm; no murmurs, clicks, rubs,  or gallops. Lungs:  CTA bilaterally.  NAD.  Abdomen:  Normal bowel sounds.  No bruits.  Soft, non-tender and non-distended without masses, hepatosplenomegaly or hernias noted.  No guarding or rebound tenderness.   Rectal:  Small external hemorrhoid 6 o'clock, non-bleeding.   Msk:  Symmetrical without gross deformities.  Pulses:  Normal pulses noted. Extremities:  No clubbing or edema. Neurologic:  Alert and oriented x4;  grossly normal neurologically. Skin:  +hirsute.  Intact without significant lesions or rashes.

## 2012-02-22 NOTE — Patient Instructions (Addendum)
We have requested your recent labwork from [place] Once I have reviewed your blood work, I will make further recommendations regarding your anemia Begin Nexium 40 mg daily Begin Anusol-HC suppositories 1 twice daily for 10 days

## 2012-02-22 NOTE — Assessment & Plan Note (Addendum)
Recently diagnosed Iron Deficiency anemia & started on po iron.  Possibly related to copaxone.  EGD pending.  Colonoscopy 2011 internal hemorrhoids.

## 2012-02-23 NOTE — Assessment & Plan Note (Addendum)
Small external hemorrhoid may be culprit of small volume hematochezia, however I am not convinced that this is source of her IDA.  Colonoscopy up to date November 2011.   Anusol HC supp 1 PR BID x 10 days

## 2012-02-23 NOTE — Assessment & Plan Note (Signed)
Begin PPI See Globus

## 2012-02-23 NOTE — Assessment & Plan Note (Signed)
See globus

## 2012-02-23 NOTE — Assessment & Plan Note (Addendum)
Continue colace since this seems to work well.  No evidence of hypothyroidism on TSH.  MS could be contributing to this.

## 2012-02-26 NOTE — Progress Notes (Signed)
Faxed to PCP

## 2012-02-28 LAB — COMPREHENSIVE METABOLIC PANEL
ALT: 12 U/L (ref 7–35)
AST: 14 U/L
Albumin: 3.9
CO2: 23 mmol/L
Calcium: 9.5 mg/dL
Chloride: 107 mmol/L
Creat: 0.73
Potassium: 4.2 mmol/L

## 2012-03-06 ENCOUNTER — Telehealth: Payer: Self-pay | Admitting: Urgent Care

## 2012-03-06 MED ORDER — PANTOPRAZOLE SODIUM 40 MG PO TBEC: 40.0000 mg | DELAYED_RELEASE_TABLET | Freq: Every day | ORAL | Status: DC

## 2012-03-06 NOTE — Telephone Encounter (Signed)
LMOM to call.

## 2012-03-06 NOTE — Telephone Encounter (Signed)
Per Medicaid, patient must try lansoprazole or pantoprazole prior to Nexium. Rx sent for pantoprazole to her pharmacy. Please let patient know to stop Nexium and start this. thanks

## 2012-03-06 NOTE — Telephone Encounter (Signed)
Pt returned call and was informed.  

## 2012-03-19 HISTORY — PX: US ECHOCARDIOGRAPHY: HXRAD669

## 2012-05-01 NOTE — Progress Notes (Signed)
REVIEWED.  LOW FERRITIN ? DUE TO RECTAL BLEEDING/?HEAVY MENSTRUAL BLEEDING.   CONTACT PT TO SCHEDULE EGD/?DIL FOR DYSPEPSIA/DYSPHAGIA.

## 2012-05-06 ENCOUNTER — Telehealth: Payer: Self-pay | Admitting: Urgent Care

## 2012-05-06 NOTE — Telephone Encounter (Signed)
Patient has an OPV w/AS for Consult EGD/ED w/[facility] on Monday 07/22 at 8:30 and patient is aware

## 2012-05-06 NOTE — Telephone Encounter (Signed)
LMOM for return call to set up EGD/ED w/ Dr Darrick Penna re: dysphagia, globus & dyspepsia.

## 2012-05-13 ENCOUNTER — Ambulatory Visit: Payer: Medicaid Other | Admitting: Gastroenterology

## 2012-05-16 ENCOUNTER — Encounter: Payer: Self-pay | Admitting: Gastroenterology

## 2012-05-16 ENCOUNTER — Ambulatory Visit (INDEPENDENT_AMBULATORY_CARE_PROVIDER_SITE_OTHER): Payer: Medicaid Other | Admitting: Gastroenterology

## 2012-05-16 ENCOUNTER — Other Ambulatory Visit: Payer: Self-pay | Admitting: Gastroenterology

## 2012-05-16 VITALS — BP 104/72 | HR 66 | Temp 98.3°F | Ht 69.0 in | Wt 261.0 lb

## 2012-05-16 DIAGNOSIS — R1310 Dysphagia, unspecified: Secondary | ICD-10-CM

## 2012-05-16 DIAGNOSIS — K5909 Other constipation: Secondary | ICD-10-CM

## 2012-05-16 DIAGNOSIS — K921 Melena: Secondary | ICD-10-CM

## 2012-05-16 DIAGNOSIS — K219 Gastro-esophageal reflux disease without esophagitis: Secondary | ICD-10-CM

## 2012-05-16 DIAGNOSIS — D649 Anemia, unspecified: Secondary | ICD-10-CM

## 2012-05-16 NOTE — Progress Notes (Signed)
Referring Provider: Colette Ribas, MD Primary Care Physician:  Colette Ribas, MD Primary Gastroenterologist: Dr. Darrick Penna   Chief Complaint  Patient presents with  . EGD  . Dysphagia    HPI:   Presents today to set up EGD/ED with Dr. Darrick Penna. States globus sensation since April. No further GERD since smoking cessation. Quit smoking April 25th. No PPI. Notes solid food dysphagia, no pill dysphagia. Not taking iron. No further bright red blood per rectum, has known hemorrhoids. Has occasional heavy menses. Last EGD by Dr Darrick Penna 07/2008 w/ incomplete Schatzki's ring and mild erosive esophagitis. No issues with constipation currently since moving out of grandparents' home, which was stressful. Anemia noted, low iron and ferritin. No abdominal pain.   02/02/12: Hgb 11.9, Iron 14 & Ferritin 9, B12 211(low normal), folate 3.6 (indeterminate), TIBC & UIBC normal.    Past Medical History  Diagnosis Date  . Hypertension   . Multiple sclerosis     Dr Wynonia Hazard  . GERD (gastroesophageal reflux disease)     Past Surgical History  Procedure Date  . Cholecystectomy 2009  . Back surgery   . Cesarean section 2000  . Esophagogastroduodenoscopy 08/05/2008    Fields-incomplete Schatzki's ring, distal esophageal erosions/esophagitis(mild), small HH, NEGATIVE h pylori  . Colonoscopy 09/14/2010    Fields-hyperplastic polyps, int hemorrhoids    Current Outpatient Prescriptions  Medication Sig Dispense Refill  . ALPRAZolam (XANAX) 0.5 MG tablet Take 0.5 mg by mouth 2 (two) times daily.        Allergies as of 05/16/2012 - Review Complete 05/16/2012  Allergen Reaction Noted  . Propoxyphene-acetaminophen      Family History  Problem Relation Age of Onset  . Heart attack Mother   . Heart attack Father   . Colon cancer Paternal Uncle 57  . Colon cancer Paternal Grandmother 61  . Coronary artery disease Father     History   Social History  . Marital Status: Divorced    Spouse  Name: N/A    Number of Children: 1  . Years of Education: N/A   Occupational History  . disabled    Social History Main Topics  . Smoking status: Former Smoker -- 1.0 packs/day for 20 years    Types: Cigarettes    Quit date: 02/15/2012  . Smokeless tobacco: None  . Alcohol Use: No  . Drug Use: No  . Sexually Active: No   Other Topics Concern  . None   Social History Narrative   Lives w/grandparents & son    Review of Systems: Gen: Denies fever, chills, anorexia. Denies fatigue, weakness, weight loss.  CV: Denies chest pain, palpitations, syncope, peripheral edema, and claudication. Resp: Denies dyspnea at rest, cough, wheezing, coughing up blood, and pleurisy. GI: SEE HPI Derm: Denies rash, itching, dry skin Psych: Denies depression, anxiety, memory loss, confusion. No homicidal or suicidal ideation.  Heme: Denies bruising, bleeding, and enlarged lymph nodes.  Physical Exam: BP 104/72  Pulse 66  Temp 98.3 F (36.8 C) (Temporal)  Ht 5\' 9"  (1.753 m)  Wt 261 lb (118.389 kg)  BMI 38.54 kg/m2  LMP 05/07/2012 General:   Alert and oriented. No distress noted. Pleasant and cooperative.  Head:  Normocephalic and atraumatic. Eyes:  Conjuctiva clear without scleral icterus. Mouth:  Oral mucosa pink and moist. Good dentition. No lesions. Neck:  Supple, without mass or thyromegaly. Heart:  S1, S2 present without murmurs, rubs, or gallops. Regular rate and rhythm. Abdomen:  +BS, soft, non-tender and non-distended. No rebound or  guarding. No HSM or masses noted. Msk:  Symmetrical without gross deformities. Normal posture. Extremities:  Without edema. Neurologic:  Alert and  oriented x4;  grossly normal neurologically. Skin:  Intact without significant lesions or rashes. Cervical Nodes:  No significant cervical adenopathy. Psych:  Alert and cooperative. Normal mood and affect.

## 2012-05-16 NOTE — Patient Instructions (Addendum)
We have set you up for an upper endoscopy and possible dilation with Dr. Darrick Penna in the near future.  Further recommendations to follow.

## 2012-05-21 NOTE — Assessment & Plan Note (Signed)
31 year old with recurrent dysphagia, globus sensation. +solid food dysphagia. 07/2008 w/ incomplete Schatzki's ring and mild erosive esophagitis. GERD resolved since smoking cessation. Currently no PPI.  Proceed with upper endoscopy/dilation in the near future with Dr. Darrick Penna. The risks, benefits, and alternatives have been discussed in detail with patient. They have stated understanding and desire to proceed.

## 2012-05-21 NOTE — Assessment & Plan Note (Signed)
Resolved

## 2012-05-21 NOTE — Assessment & Plan Note (Signed)
Resolved since smoking cessation. EGD as scheduled.

## 2012-05-21 NOTE — Progress Notes (Signed)
Faxed to PCP

## 2012-05-21 NOTE — Assessment & Plan Note (Signed)
Improved since moving out of stressful living environment.

## 2012-05-21 NOTE — Assessment & Plan Note (Signed)
IDA. Question secondary to heavy menses, recent rectal bleeding. However, rectal bleeding resolved, known hemorrhoids. TCS up-to-date. EGD as planned.

## 2012-05-24 ENCOUNTER — Encounter (HOSPITAL_COMMUNITY): Payer: Self-pay | Admitting: Pharmacy Technician

## 2012-06-07 MED ORDER — SODIUM CHLORIDE 0.45 % IV SOLN
Freq: Once | INTRAVENOUS | Status: AC
  Administered 2012-06-10: 10:00:00 via INTRAVENOUS

## 2012-06-10 ENCOUNTER — Ambulatory Visit (HOSPITAL_COMMUNITY)
Admission: RE | Admit: 2012-06-10 | Discharge: 2012-06-10 | Disposition: A | Discharge status: Home / Self Care | Payer: Medicaid Other | Source: Ambulatory Visit | Attending: Gastroenterology | Admitting: Gastroenterology

## 2012-06-10 ENCOUNTER — Encounter (HOSPITAL_COMMUNITY)
Admission: RE | Disposition: A | Discharge status: Home / Self Care | Payer: Self-pay | Source: Ambulatory Visit | Attending: Gastroenterology

## 2012-06-10 ENCOUNTER — Encounter (HOSPITAL_COMMUNITY): Payer: Self-pay | Admitting: *Deleted

## 2012-06-10 DIAGNOSIS — K449 Diaphragmatic hernia without obstruction or gangrene: Secondary | ICD-10-CM | POA: Insufficient documentation

## 2012-06-10 DIAGNOSIS — K2970 Gastritis, unspecified, without bleeding: Secondary | ICD-10-CM

## 2012-06-10 DIAGNOSIS — K222 Esophageal obstruction: Secondary | ICD-10-CM

## 2012-06-10 DIAGNOSIS — K2990 Gastroduodenitis, unspecified, without bleeding: Secondary | ICD-10-CM

## 2012-06-10 DIAGNOSIS — K209 Esophagitis, unspecified without bleeding: Secondary | ICD-10-CM | POA: Insufficient documentation

## 2012-06-10 DIAGNOSIS — K2940 Chronic atrophic gastritis without bleeding: Secondary | ICD-10-CM | POA: Insufficient documentation

## 2012-06-10 DIAGNOSIS — R1310 Dysphagia, unspecified: Secondary | ICD-10-CM

## 2012-06-10 SURGERY — ESOPHAGOGASTRODUODENOSCOPY (EGD) WITH ESOPHAGEAL DILATION
Anesthesia: Moderate Sedation

## 2012-06-10 MED ORDER — MIDAZOLAM HCL 5 MG/5ML IJ SOLN
INTRAMUSCULAR | Status: AC
  Filled 2012-06-10: qty 10

## 2012-06-10 MED ORDER — OMEPRAZOLE 20 MG PO CPDR: DELAYED_RELEASE_CAPSULE | ORAL | Status: AC

## 2012-06-10 MED ORDER — MINERAL OIL PO OIL
TOPICAL_OIL | ORAL | Status: AC
  Filled 2012-06-10: qty 30

## 2012-06-10 MED ORDER — MIDAZOLAM HCL 5 MG/5ML IJ SOLN
INTRAMUSCULAR | Status: DC | PRN
  Administered 2012-06-10 (×3): 2 mg via INTRAVENOUS

## 2012-06-10 MED ORDER — BUTAMBEN-TETRACAINE-BENZOCAINE 2-2-14 % EX AERO
INHALATION_SPRAY | CUTANEOUS | Status: DC | PRN
  Administered 2012-06-10: 2 via TOPICAL

## 2012-06-10 MED ORDER — STERILE WATER FOR IRRIGATION IR SOLN
Status: DC | PRN
  Administered 2012-06-10: 10:00:00

## 2012-06-10 MED ORDER — MEPERIDINE HCL 100 MG/ML IJ SOLN
INTRAMUSCULAR | Status: DC | PRN
  Administered 2012-06-10 (×2): 50 mg via INTRAVENOUS

## 2012-06-10 MED ORDER — MEPERIDINE HCL 100 MG/ML IJ SOLN
INTRAMUSCULAR | Status: AC
  Filled 2012-06-10: qty 1

## 2012-06-10 NOTE — Op Note (Signed)
[facility] [address] [place], [address]   ENDOSCOPY PROCEDURE REPORT  PATIENT: [name], [name]  MR#: [id number] BIRTHDATE: 1981-04-01 , 31  yrs. old GENDER: Female ENDOSCOPIST: Jonette Eva, MD REFFERED OZ:DGUY Phillips Odor, M.D. PROCEDURE DATE:  06/10/2012 PROCEDURE:   EGD with biopsy and EGD with dilatation over guidewire  ASA CLASS: INDICATIONS:1.  DYSPHAGIA, ANEMIA HB 11.2, FERRITIN 9-LAST EGD/TCS 2009/2011 ESOPHAGITIS/IH. MEDICATIONS: Demerol 100 mg IV and Versed 6 mg IV TOPICAL ANESTHETIC: Cetacaine Spray  DESCRIPTION OF PROCEDURE:   After the risks benefits and alternatives of the procedure were thoroughly explained, informed consent was obtained.  The EG-[id number] ([id number])  endoscope was introduced through the mouth and advanced to the second portion of the duodenum.  The instrument was slowly withdrawn as the mucosa was carefully examined.  Prior to withdrawal of the scope, the guidwire was placed.  The esophagus was dilated successfully.  The patient was recovered in endoscopy and discharged home in satisfactory condition.    MILD Esophagitis was found.  A SMALL hiatal hernia was found.  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   Mild gastritis was found.  A COLD FORCEPS biopsy for H.  pylori was taken.   The duodenal bulb was normal in appearance, as was the postbulbar duodenum.  Multiple biopsies were obtained and sent to pathology TO EVALUATE FOR CELIAC SPRUE.   Dilation was then performed at the distal esophagus  Dilator: Savary guidewire 14-16 MM  COMPLICATIONS: NONE   ENDOSCOPIC IMPRESSION: 1.   MILD Esophagitis was found 2.   A SMALL hiatal hernia was found 3.   A stricture was found in the distal esophagus NARROWING THE LUMEN TO 12-13 MM. 4.   Mild gastritis   RECOMMENDATIONS: 1.  Prilosec 20 mg po bid 30 MINUTES PRIO TO MEALS BID THEN ONCE DAILY FOREVER 2.  follow-up: office 3 month(s) 3. LOW FAT DIET. 4. LOSE  WEIGHT 5. CONTINUE SMOKING CESSSATION      _______________________________ Rosalie DoctorJonette Eva, MD 06/10/2012 11:29 AM      PATIENT NAME:  [name], [name] MR#: [id number]

## 2012-06-10 NOTE — OR Nursing (Signed)
Vital signs stable throughout procedure. Device cut off before VS printed to be recorded.

## 2012-06-10 NOTE — H&P (Signed)
  Primary Care Physician:  Colette Ribas, MD Primary Gastroenterologist:  Dr. Darrick Penna  Pre-Procedure History & Physical: HPI:  [name] is a 31 y.o. female here for DYSPHAGIA.   Past Medical History  Diagnosis Date  . Hypertension   . Multiple sclerosis     Dr Wynonia Hazard  . GERD (gastroesophageal reflux disease)     Past Surgical History  Procedure Date  . Cholecystectomy 2009  . Back surgery   . Cesarean section 2000  . Esophagogastroduodenoscopy 08/05/2008    Win Guajardo-incomplete Schatzki's ring, distal esophageal erosions/esophagitis(mild), small HH, NEGATIVE h pylori  . Colonoscopy 09/14/2010    Dymon Summerhill-hyperplastic polyps, int hemorrhoids    Prior to Admission medications   Medication Sig Start Date End Date Taking? Authorizing Provider  ALPRAZolam Prudy Feeler) 1 MG tablet Take 0.5 mg by mouth 2 (two) times daily.   Yes Historical Provider, MD    Allergies as of 05/16/2012 - Review Complete 05/16/2012  Allergen Reaction Noted  . Propoxyphene-acetaminophen      Family History  Problem Relation Age of Onset  . Heart attack Mother   . Heart attack Father   . Colon cancer Paternal Uncle 28  . Colon cancer Paternal Grandmother 90  . Coronary artery disease Father     History   Social History  . Marital Status: Divorced    Spouse Name: N/A    Number of Children: 1  . Years of Education: N/A   Occupational History  . disabled    Social History Main Topics  . Smoking status: Former Smoker -- 1.0 packs/day for 20 years    Types: Cigarettes    Quit date: 02/15/2012  . Smokeless tobacco: Not on file  . Alcohol Use: No  . Drug Use: No  . Sexually Active: No   Other Topics Concern  . Not on file   Social History Narrative   Lives w/grandparents & son    Review of Systems: See HPI, otherwise negative ROS   Physical Exam: BP 158/85  Pulse 74  Temp 97.9 F (36.6 C) (Oral)  Resp 24  Ht 5\' 9"  (1.753 m)  Wt 261 lb (118.389 kg)  BMI 38.54  kg/m2  SpO2 98%  LMP 05/14/2012 General:   Alert,  pleasant and cooperative in NAD Head:  Normocephalic and atraumatic. Neck:  Supple;  Lungs:  Clear throughout to auscultation.    Heart:  Regular rate and rhythm. Abdomen:  Soft, nontender and nondistended. Normal bowel sounds, without guarding, and without rebound.   Neurologic:  Alert and  oriented x4;  grossly normal neurologically.  Impression/Plan:     DYSPHAGIA  PLAN:  EGD/DIL TODAY

## 2012-06-13 ENCOUNTER — Other Ambulatory Visit: Payer: Self-pay

## 2012-06-13 ENCOUNTER — Telehealth: Payer: Self-pay | Admitting: Gastroenterology

## 2012-06-13 DIAGNOSIS — K219 Gastro-esophageal reflux disease without esophagitis: Secondary | ICD-10-CM

## 2012-06-13 NOTE — Telephone Encounter (Signed)
Recall made 

## 2012-06-13 NOTE — Progress Notes (Signed)
EGD AUG 2013 GASTRITIS  REVIEWED.

## 2012-06-13 NOTE — Telephone Encounter (Signed)
Pt is aware of results and I put in her order for Nov. Darl Pikes can you NIC for 3 months(NOV)

## 2012-06-13 NOTE — Telephone Encounter (Signed)
Please call pt. HER stomach Bx shows gastritis.    CONTINUE YOUR WEIGHT LOSS EFFORTS.  PRILOSEC 30 MINUTES PRIOR TO YOUR FIRST MEAL TWICE DAILY FOR 3 MOS THEN ONCE DAILY FOREVER UNTIL YOUR BODY MASS INDEX IS LESS THAN 30.   FOLLOW A LOW FAT DIET.   FOLLOW UP IN 3 MOS E30. YOU WILL NEED A CBC AND FERRITIN ONE WEEK PRIOR TO YOUR NEXT VISIT.

## 2012-07-10 DIAGNOSIS — G35 Multiple sclerosis: Secondary | ICD-10-CM | POA: Diagnosis not present

## 2012-07-10 DIAGNOSIS — R269 Unspecified abnormalities of gait and mobility: Secondary | ICD-10-CM | POA: Diagnosis not present

## 2012-07-10 DIAGNOSIS — R404 Transient alteration of awareness: Secondary | ICD-10-CM | POA: Diagnosis not present

## 2012-07-10 DIAGNOSIS — G3281 Cerebellar ataxia in diseases classified elsewhere: Secondary | ICD-10-CM | POA: Diagnosis not present

## 2012-08-04 DIAGNOSIS — IMO0002 Reserved for concepts with insufficient information to code with codable children: Secondary | ICD-10-CM | POA: Diagnosis not present

## 2012-08-04 DIAGNOSIS — G4710 Hypersomnia, unspecified: Secondary | ICD-10-CM | POA: Diagnosis not present

## 2012-08-07 ENCOUNTER — Other Ambulatory Visit: Payer: Self-pay

## 2012-08-07 DIAGNOSIS — K219 Gastro-esophageal reflux disease without esophagitis: Secondary | ICD-10-CM

## 2012-08-09 DIAGNOSIS — G35 Multiple sclerosis: Secondary | ICD-10-CM | POA: Diagnosis not present

## 2012-08-09 DIAGNOSIS — F411 Generalized anxiety disorder: Secondary | ICD-10-CM | POA: Diagnosis not present

## 2012-08-09 DIAGNOSIS — I428 Other cardiomyopathies: Secondary | ICD-10-CM | POA: Diagnosis not present

## 2012-08-23 ENCOUNTER — Encounter: Payer: Self-pay | Admitting: Gastroenterology

## 2012-09-05 ENCOUNTER — Other Ambulatory Visit (HOSPITAL_COMMUNITY): Payer: Self-pay

## 2012-09-05 DIAGNOSIS — I059 Rheumatic mitral valve disease, unspecified: Secondary | ICD-10-CM

## 2013-01-08 ENCOUNTER — Telehealth: Payer: Self-pay | Admitting: *Deleted

## 2013-01-08 NOTE — Telephone Encounter (Signed)
Called patient to reschedule appointment from 02/12/13, no answer, lt voicemail message to call back.

## 2013-01-23 ENCOUNTER — Encounter: Payer: Self-pay | Admitting: Neurology

## 2013-01-23 ENCOUNTER — Other Ambulatory Visit: Payer: Self-pay | Admitting: Neurology

## 2013-01-23 ENCOUNTER — Ambulatory Visit (INDEPENDENT_AMBULATORY_CARE_PROVIDER_SITE_OTHER): Payer: Medicare Other | Admitting: Neurology

## 2013-01-23 VITALS — BP 129/87 | HR 69 | Temp 97.9°F | Ht 68.5 in | Wt 279.0 lb

## 2013-01-23 DIAGNOSIS — G35 Multiple sclerosis: Secondary | ICD-10-CM

## 2013-01-23 DIAGNOSIS — F418 Other specified anxiety disorders: Secondary | ICD-10-CM

## 2013-01-23 DIAGNOSIS — R269 Unspecified abnormalities of gait and mobility: Secondary | ICD-10-CM | POA: Diagnosis not present

## 2013-01-23 DIAGNOSIS — F341 Dysthymic disorder: Secondary | ICD-10-CM

## 2013-01-23 DIAGNOSIS — IMO0001 Reserved for inherently not codable concepts without codable children: Secondary | ICD-10-CM

## 2013-01-23 MED ORDER — PREDNISOLONE 5 MG PO KIT: 5.0000 mg | PACK | Freq: Every morning | ORAL | Status: DC

## 2013-01-23 NOTE — Patient Instructions (Signed)
Multiple Sclerosis Multiple sclerosis (MS) is a disease of the central nervous system. Its cause is unknown. It is more common in the [place] states than in the [place] states. There is a higher incidence of MS in women. There is a wide variation in the symptoms (problems) of MS. This is because of the many different ways it affects the central nervous system. It often comes on in episodes or attacks. These attacks may last weeks to months. There may be long periods of nearly no problems between attacks. The main symptoms include visual problems (associated with eye pain), numbness, weakness, and paralysis in extremities (arms/hands and legs/feet). There may also be tremors and problems with balance and walking. The age when MS starts is variable. Advances in medicine continue to improve the treatment of this illness. There is no known cure for MS but there are medications that help. MS is not an inherited illness, although your risk of getting this disease is higher if you have a relative with MS. The best radiologic (x-ray) study for MS is an MRI (magnetic resonance imaging). There are medications available to decrease the number and frequency of attacks. SYMPTOMS  The symptoms of MS are caused by loss of insulation (myelin) of the nerves of the brain. When this happens, brain signals do not get transmitted properly or may not get transmitted at all. Some of the problems caused by this include:   Numbness.  Weakness.  Paralysis in extremities.  Visual problems, eye pain.  Balance problems.  Tremors. DIAGNOSIS  Your caregiver can do studies on you to make this diagnosis. This may include specialized X-rays and spinal fluid studies. HOME CARE INSTRUCTIONS   Take medications as directed by your caregiver. Baclofen is a drug commonly used to reduce muscle spasticity. Steroids are often used for short term relief.  Exercise as directed.  Use physical and occupational therapy as directed by  your caregiver. Careful attention to this medical care can help avoid depression.  See your caregiver if you begin to have problems with depression. This is a common problem in MS. Patients often continue to work many years after the diagnosis of MS. Document Released: 10/06/2000 Document Revised: 01/01/2012 Document Reviewed: 05/15/2007 [facility] Patient Information 2013 [place], [place]. Exercise to Lose Weight Exercise and a healthy diet may help you lose weight. Your doctor may suggest specific exercises. EXERCISE IDEAS AND TIPS  Choose low-cost things you enjoy doing, such as walking, bicycling, or exercising to workout videos.  Take stairs instead of the elevator.  Walk during your lunch break.  Park your car further away from work or school.  Go to a gym or an exercise class.  Start with 5 to 10 minutes of exercise each day. Build up to 30 minutes of exercise 4 to 6 days a week.  Wear shoes with good support and comfortable clothes.  Stretch before and after working out.  Work out until you breathe harder and your heart beats faster.  Drink extra water when you exercise.  Do not do so much that you hurt yourself, feel dizzy, or get very short of breath. Exercises that burn about 150 calories:  Running 1  miles in 15 minutes.  Playing volleyball for 45 to 60 minutes.  Washing and waxing a car for 45 to 60 minutes.  Playing touch football for 45 minutes.  Walking 1  miles in 35 minutes.  Pushing a stroller 1  miles in 30 minutes.  Playing basketball for 30 minutes.  Raking  leaves for 30 minutes.  Bicycling 5 miles in 30 minutes.  Walking 2 miles in 30 minutes.  Dancing for 30 minutes.  Shoveling snow for 15 minutes.  Swimming laps for 20 minutes.  Walking up stairs for 15 minutes.  Bicycling 4 miles in 15 minutes.  Gardening for 30 to 45 minutes.  Jumping rope for 15 minutes.  Washing windows or floors for 45 to 60 minutes. Document Released:  11/11/2010 Document Revised: 01/01/2012 Document Reviewed: 11/11/2010 [facility] Patient Information 2013 [place], [place]. 1200 Calorie Diabetic Diet The 1200 calorie diabetic diet limits calories to 1200 each day. Following this diet and making healthy meal choices can help improve overall health. It controls blood glucose (sugar) levels and can also help lower blood pressure and cholesterol.  SERVING SIZES Measuring foods and serving sizes helps to make sure you are getting the right amount of food. The list below tells how big or small some common serving sizes are.   1 oz.........4 stacked dice.  3 oz........Marland KitchenDeck of cards.  1 tsp.......Marland KitchenTip of little finger.  1 tbs......Marland KitchenMarland KitchenThumb.  2 tbs.......Marland KitchenGolf ball.   cup......Marland KitchenHalf of a fist.  1 cup.......Marland KitchenA fist. GUIDELINES FOR CHOOSING FOODS The goal of this diet is to eat a variety of foods and limit calories to 1200 each day. This can be done by choosing foods that are low in calories and fat. The diet also suggests eating small amounts of food frequently. Doing this helps control your blood glucose levels, so they do not get too high or too low. Each meal or snack may include a protein food source to help you feel more satisfied. Try to eat about the same amount of food around the same time each day. This includes weekend days, travel days, and days off work. Space your meals about 4 to 5 hours apart, and add a snack between them, if you wish.  For example, a daily food plan could include breakfast, a morning snack, lunch, dinner, and an evening snack. Healthy meals and snacks have different types of foods, including whole grains, vegetables, fruits, lean meats, poultry, fish, and dairy products. As you plan your meals, select a variety of foods. Choose from the bread and starch, vegetable, fruit, dairy, and meat/protein groups. Examples of foods from each group are listed below, with their suggested serving sizes. Use measuring cups and spoons to  become familiar with what a healthy portion looks like. Bread and Starch Each serving equals 15 grams of carbohydrate.  1 slice bread.   bagel.   cup cold cereal (unsweetened).   cup hot cereal or mashed potatoes.  1 small potato (size of a computer mouse).   cup cooked pasta or rice.   English muffin.  1 cup broth-based soup.  3 cups of popcorn.  4 to 6 whole-wheat crackers.   cup cooked beans, peas, or corn. Vegetables Each serving equals 5 grams of carbohydrate.   cup cooked vegetables.  1 cup raw vegetables.   cup tomato or vegetable juice. Fruit Each serving equals 15 grams of carbohydrate.  1 small apple or orange.  1  cup watermelon or strawberries.   cup applesauce (no sugar added).  2 tbs raisins.   banana.   cup canned fruit, packed in water or in its own juice.   cup unsweetened fruit juice. Dairy Each serving equals 12 to 15 grams of carbohydrate.  1 cup fat-free milk.  6 oz artificially sweetened yogurt or plain yogurt.  1 cup low-fat buttermilk.  1 cup  soy milk.  1 cup almond milk. Meat/Protein  1 large egg.  2 to 3 oz meat, poultry, or fish.   cup low-fat cottage cheese.  1 tbs peanut butter.  1 oz low-fat cheese.   cup tuna, packed in water.   cup tofu. Fat  1 tsp oil.  1 tsp trans-fat-free margarine.  1 tsp butter.  1 tsp mayonnaise.  2 tbs avocado.  1 tbs salad dressing.  1 tbs cream cheese.  2 tbs sour cream. SAMPLE 1200 CALORIE DIET PLAN Breakfast  1 cup fat-free milk (1 carb serving).  1 small orange (1 carb serving).  1 scrambled egg.  1 slice whole-wheat toast (1 carb serving). Lunch  Sandwich  2 slices whole-wheat bread (2 carb servings).  2 oz lean meat.  2 tsp reduced fat mayonnaise.  1 lettuce leaf.  2 slices tomato.  1 cup carrot sticks. Afternoon Snack  1 small apple (1 carb serving).  1 string cheese. Dinner  2 oz meat.  1 small baked potato (1  carb serving).  1 tsp trans-fat-free margarine.  1 cup steamed broccoli.  1 cup fat-free milk (1 carb serving). Evening Snack   small banana (1 carb serving).  6 vanilla wafers (1 carb serving). MEAL PLAN You can use this worksheet to help you make a daily meal plan based on the 1200 calorie diabetic diet suggestions. If you are using this plan to help you control your blood glucose, you may interchange carbohydrate containing foods (dairy, starches, and fruits). Select a variety of fresh foods of varying colors and flavors. The total amount of carbohydrate in your meals or snacks is more important than making sure you include all of the food groups every time you eat. You can choose from approximately this many of the following foods to build your day's meals:  6 Starches.  3 Vegetables.  2 Fruits.  2 Dairy.  4 to 6 oz Meat/Protein.  Up to 3 Fats. Your dietician can use this worksheet to help you decide how many servings and which types of foods are right for you. BREAKFAST Food Group and Servings / Food Choice Starch _________________________________________________________ Dairy __________________________________________________________ Fruit ___________________________________________________________ Meat/Protein____________________________________________________ Fat ____________________________________________________________ LUNCH Food Group and Servings / Food Choice  Starch _________________________________________________________ Meat/Protein ___________________________________________________ Vegetables _____________________________________________________ Fruit __________________________________________________________ Dairy __________________________________________________________ Fat ____________________________________________________________ Aura Fey Food Group and Servings / Food Choice Dairy  __________________________________________________________ Fruit ___________________________________________________________ Starch __________________________________________________________ Meat/Protein____________________________________________________ Laural Golden Food Group and Servings / Food Choice Starch _________________________________________________________ Meat/Protein ___________________________________________________ Dairy __________________________________________________________ Vegetables _____________________________________________________ Fruit __________________________________________________________ Fat ____________________________________________________________ Lollie Sails Food Group and Servings / Food Choice Fruit ___________________________________________________________ Meat/Protein ____________________________________________________ Dairy __________________________________________________________ Starch __________________________________________________________ DAILY TOTALS Starches _________________________ Vegetables _______________________ Fruits ____________________________ Dairy ____________________________ Meat/Protein______________________ Fats _____________________________ Document Released: 05/01/2005 Document Revised: 01/01/2012 Document Reviewed: 08/26/2009 [employer] Patient Information 2013 [place], [place].

## 2013-01-23 NOTE — Progress Notes (Signed)
[facility]  Provider:  Dr Aislin [name] Referring Provider: Corrie Mckusick, MD Primary Care Physician:  Jessica Ribas, MD  Chief Complaint  [name] presents with  . Follow-up    multiple [name]    HPI:  Jessica [name] is a 32 y.o. female here seen in follow up was originally seen  as a referral from Jessica. Phillips Odor . Jessica [name] was diagnosed in 2003 Jessica [name] Jessica [name] is a right-handed Caucasian female single mother and she is treated with Copaxone injections for chemotherapy. Jessica [name] developed progressive gait impairment over Jessica last 3 years has a history of vision impairment as optic neuritis and has complained about memory difficulties and concentration difficulties in Jessica past an MRI obtained in 2013 of Jessica brain short neural frontal demyelinating lesions which Jessica [name] Jessica [name] was on Copaxone. CBC and metabolic panels at that time were normal Jessica [name] on considered a change good Tysabri origin apnea she was also followed up by a physical therapist for gait and ROM improvement.  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 She is now accepted as disabled.  Jessica [name] has over Jessica last 4 years progressively developed a slowing and her ambulation and was prone to  Falling. Fall risk remains elevated  at 9 points-    She had undergone JC virus testing when I am initially intended to place her on Tysabri origin apnea this returned negative she also had in September 2030 and a first dorsal evaluation for possible change Jessica [name] and was found to have a cardiac valve insufficiency Jessica. L[name]. [facility] mentioned after his evaluation that Jessica [name] had a high degree of daytime sleepiness. In response to this rehabilitation center for a split study showed a normal PSG no significant AHI no PLM  arousals nor RDI and obvious spontaneous arousals.  She has considered taking stimulant medications but couldn't use these with her heart history. Her  Use of Seroquel allowed for deep sleep att a 25 mg  daily dose and she feels better.    Until than she still  took naps 2 hours in Jessica morning after her son leaves for school , slept from 23.00 hours to 6.30.  Worth sleepiness score is not endorsed at 6 point.  Review of Systems: Out of a complete 14 system review, Jessica [name] complains of only Jessica following symptoms, and all other reviewed systems are negative. Daytime fatigue and sleepiness, high fall risk, difficulties concentrating and memorizing. Numbness in Jessica left hand as Jessica most recent development attributed to MS.  History   Social History  . Marital Status: Divorced    Spouse Name: N/A    Number of Children: 1  . Years of Education: N/A   Occupational History  . disabled    Social History Main Topics  . Smoking status: Former Smoker -- 1.00 packs/day for 20 years    Types: Cigarettes    Quit date: 02/15/2012  . Smokeless tobacco: Not on file  . Alcohol Use: No  . Drug Use: No  . Sexually Active: No   Other Topics Concern  . Not on file   Social History Narrative   Lives w/grandparents & son    Family History  Problem Relation Age of Onset  . Heart attack Mother   . Heart attack Father   .  Coronary artery disease Father   . Cancer Father     lung  . Diabetes Father   . Colon cancer Paternal Uncle 52  . Colon cancer Paternal Grandmother 50  . Diabetes Paternal Grandmother   . Multiple [name] Maternal Aunt   . Cancer Maternal Grandmother 72    kind unknown  . Myasthenia gravis Maternal Grandfather     Past Medical History  Diagnosis Date  . Hypertension   . Multiple [name]     Jessica Wynonia Hazard  . GERD (gastroesophageal reflux disease)   . Regurgitation     cardiac valve. echo 2013 for first eval GILENYA  . Numbness in right leg 02/13,12/12  .  Multiple [name] exacerbation   . Obesity (BMI 35.0-39.9 without comorbidity)     Past Surgical History  Procedure Laterality Date  . Cholecystectomy  2010  . Back surgery  2006  . Cesarean section  2000  . Esophagogastroduodenoscopy  08/05/2008    Fields-incomplete Schatzki's ring, distal esophageal erosions/esophagitis(mild), small HH, NEGATIVE h pylori  . Colonoscopy  09/14/2010    Fields-hyperplastic polyps, int hemorrhoids  . Cyst excision  2006    pilonoidal    Current Outpatient Prescriptions  Medication Sig Dispense Refill  . ALPRAZolam (XANAX) 1 MG tablet Take 0.5 mg by mouth 2 (two) times daily. 1/2 tablet twice daily      . glatiramer (COPAXONE) 20 MG/ML injection Inject 20 mg into Jessica skin daily. Take as directed      . omeprazole (PRILOSEC) 20 MG capsule 1 po bid 30 minutes before meals for 3 mos then once daily FOREVER  62 capsule  11  . PREDNISOLONE PO Take by mouth. 10 mg, 7 day dose pack      . QUEtiapine (SEROQUEL) 25 MG tablet Take 25 mg by mouth at bedtime. If too sedated, take 1/2 tablet       No current facility-administered medications for this visit.    Allergies as of 01/23/2013 - Review Complete 01/23/2013  Allergen Reaction Noted  . Propoxyphene-acetaminophen Rash     Vitals: BP 129/87  Pulse 69  Temp(Src) 97.9 F (36.6 C)  Ht 5' 8.5" (1.74 m)  Wt 279 lb (126.554 kg)  BMI 41.8 kg/m2 Last Weight:  Wt Readings from Last 1 Encounters:  01/23/13 279 lb (126.554 kg)   Last Height:   Ht Readings from Last 1 Encounters:  01/23/13 5' 8.5" (1.74 m)    Physical Exam: General:   [name], alert & oriented to person, place & time.  Head:  Normocephalic. Ears, Nose, Throat:  [place]:3 Neck:  Circumference : 17 inches , thick . Can breath through either nasion.  Respiratory:  Lungs clear throughout to auscultation.    Cardiovascular:  No carotid artery bruits.  Heart is regular rate and rhythm with no murmurs.  Skin:  No bruising, no  rash.  Neurologic Exam: Mental Status: Alert, oriented, thought content appropriate.  Speech fluent without evidence of aphasia. Able to follow 3 step commands without difficulty. Cranial Nerves: Vision with Jessica right eye remains alert since above as optic neuritis. Unclear disc margins. It left eye disc clear of full visual fields no facial asymmetry tongue and uvula in midline.  motor examination: shoulder right hand weakness  With loss of grip strength as well as  dysmetria  , but no drift.   Sensory :  new onset of left hand numbness in Jessica past numbness in Jessica right hand affecting all 5 fingers and Jessica palm  it had been reported Jessica loss in Jessica left hand is new but Jessica [name] also has a history of right leg loss of proprioception right-sided weakness with limp and Jessica achiness is myalgia in both legs she has describes soreness more than cramping. Coordination finger-to-nose dysmetria was noted on Jessica right heel-to-shin on Jessica right was not able to perform gait and station [name] walks with a cane and was of assistive device equal deep tendon reflexes bilaterally reflexes were attenuated left poor of cooling on exam. V/VII-Smile symmetricVIII-grossly intact IX/X-normal gag XI-bilateral shoulder shrug XII-midline tongue extension Deep Tendon Reflexes: 2+ and symmetric throughout Plantars: upgoing on Jessica left  Cerebellar: slow  finger-to-nose dysmetria on Jessica righ and left-,unable to perform rapid alternating movements and normal heel-to-shin test.   Gait and station. Jessica [name] has a wider based gait, she circumduct Jessica right leg and everts.  She extends her arms slightly to gain better balance - Her turns are fragmented in four steps.

## 2013-01-23 NOTE — Assessment & Plan Note (Signed)
#  1 multiple sclerosis patient will continue on Copaxone injection 20 mg daily. #2 myalgia of and increased muscle tone responding to oral steroids. #3 gastroesophageal reflux treated with Prilosec. #4 insomnia treated with Seroquel anxiety treated with Xanax. Continue current therapies for sleepiness score has decreased from 15 points to 6 points since the patient is able to achieve deep sleep at night.

## 2013-04-16 ENCOUNTER — Other Ambulatory Visit: Payer: Self-pay

## 2013-04-16 MED ORDER — ALPRAZOLAM 1 MG PO TABS: 0.5000 mg | ORAL_TABLET | Freq: Two times a day (BID) | ORAL | Status: DC

## 2013-04-18 ENCOUNTER — Other Ambulatory Visit: Payer: Self-pay

## 2013-04-18 DIAGNOSIS — IMO0001 Reserved for inherently not codable concepts without codable children: Secondary | ICD-10-CM

## 2013-04-18 DIAGNOSIS — G35 Multiple sclerosis: Secondary | ICD-10-CM

## 2013-04-18 DIAGNOSIS — R269 Unspecified abnormalities of gait and mobility: Secondary | ICD-10-CM

## 2013-04-18 MED ORDER — PREDNISOLONE 5 MG PO KIT: 5.0000 mg | PACK | ORAL | Status: DC

## 2013-04-20 ENCOUNTER — Encounter: Payer: Self-pay | Admitting: *Deleted

## 2013-04-23 ENCOUNTER — Ambulatory Visit (HOSPITAL_COMMUNITY)
Admission: RE | Admit: 2013-04-23 | Discharge: 2013-04-23 | Disposition: A | Discharge status: Home / Self Care | Payer: Medicare Other | Source: Ambulatory Visit | Attending: Internal Medicine | Admitting: Internal Medicine

## 2013-04-23 DIAGNOSIS — R011 Cardiac murmur, unspecified: Secondary | ICD-10-CM | POA: Diagnosis not present

## 2013-04-23 DIAGNOSIS — I079 Rheumatic tricuspid valve disease, unspecified: Secondary | ICD-10-CM | POA: Diagnosis not present

## 2013-04-23 DIAGNOSIS — I359 Nonrheumatic aortic valve disorder, unspecified: Secondary | ICD-10-CM

## 2013-04-23 DIAGNOSIS — K219 Gastro-esophageal reflux disease without esophagitis: Secondary | ICD-10-CM | POA: Diagnosis not present

## 2013-04-23 DIAGNOSIS — Z87891 Personal history of nicotine dependence: Secondary | ICD-10-CM | POA: Insufficient documentation

## 2013-04-23 DIAGNOSIS — I517 Cardiomegaly: Secondary | ICD-10-CM | POA: Diagnosis not present

## 2013-04-23 DIAGNOSIS — I369 Nonrheumatic tricuspid valve disorder, unspecified: Secondary | ICD-10-CM

## 2013-04-23 DIAGNOSIS — I10 Essential (primary) hypertension: Secondary | ICD-10-CM | POA: Diagnosis not present

## 2013-04-23 DIAGNOSIS — I059 Rheumatic mitral valve disease, unspecified: Secondary | ICD-10-CM | POA: Diagnosis not present

## 2013-04-23 NOTE — Progress Notes (Signed)
2D Echo Performed 04/23/2013    Ziyanna Tolin, RCS  

## 2013-05-05 ENCOUNTER — Telehealth: Payer: Self-pay | Admitting: *Deleted

## 2013-05-05 NOTE — Telephone Encounter (Signed)
Spoke to patient. Results given. Voiced understanding. 

## 2013-05-05 NOTE — Telephone Encounter (Signed)
Message copied by Tobin Chad on Mon May 05, 2013 10:00 AM ------      Message from: Thurmon Fair      Created: Sun May 04, 2013  6:51 PM       No serious abnormalities on echo, moderate TR explains murmur, but otherwise no need for further evaluation ------

## 2013-05-08 ENCOUNTER — Encounter: Payer: Self-pay | Admitting: Cardiovascular Disease

## 2013-05-09 ENCOUNTER — Ambulatory Visit (INDEPENDENT_AMBULATORY_CARE_PROVIDER_SITE_OTHER): Payer: Medicare Other | Admitting: Cardiovascular Disease

## 2013-05-09 VITALS — BP 118/78 | HR 62 | Resp 16 | Ht 68.0 in | Wt 275.4 lb

## 2013-05-09 DIAGNOSIS — R011 Cardiac murmur, unspecified: Secondary | ICD-10-CM

## 2013-05-09 DIAGNOSIS — I079 Rheumatic tricuspid valve disease, unspecified: Secondary | ICD-10-CM | POA: Diagnosis not present

## 2013-05-09 DIAGNOSIS — I059 Rheumatic mitral valve disease, unspecified: Secondary | ICD-10-CM

## 2013-05-09 DIAGNOSIS — I340 Nonrheumatic mitral (valve) insufficiency: Secondary | ICD-10-CM

## 2013-05-09 DIAGNOSIS — I071 Rheumatic tricuspid insufficiency: Secondary | ICD-10-CM

## 2013-05-09 NOTE — Patient Instructions (Signed)
Your physician recommends that you schedule a follow-up appointment in: 1 year Avoid excessive sodium in your diet. Congratulations on quitting smoking. This is excellent!  Your physician encouraged you to lose weight for better health.

## 2013-05-14 ENCOUNTER — Encounter: Payer: Self-pay | Admitting: Cardiovascular Disease

## 2013-05-17 ENCOUNTER — Encounter: Payer: Self-pay | Admitting: Cardiovascular Disease

## 2013-05-17 DIAGNOSIS — I071 Rheumatic tricuspid insufficiency: Secondary | ICD-10-CM | POA: Insufficient documentation

## 2013-05-17 DIAGNOSIS — I340 Nonrheumatic mitral (valve) insufficiency: Secondary | ICD-10-CM | POA: Insufficient documentation

## 2013-05-17 NOTE — Assessment & Plan Note (Signed)
This was described as moderate, on the most recent echocardiogram it appears to be only mild. There is no evidence of left heart dilatation and no symptoms of congestive heart failure. This is unlikely to be clinically relevant problem at this time. I don't think it's possible to say whether or not is related to use of anorexia and drugs in the past.

## 2013-05-17 NOTE — Assessment & Plan Note (Signed)
There is moderate tricuspid insufficiency, which is likely part of the same clinical syndrome as her right atrial enlargement and right ventricular enlargement. Reportedly she does not have sleep apnea. She does not appear to fulfill criteria for obesity hypoventilation either. By echocardiographic criteria there is no evidence of pulmonary artery hypertension. She does have an atrial septal aneurysm but there is no visible shunt across the atrial septum. Right now she does not have any signs or symptoms of congestive heart failure and the inferior vena cava is small by echocardiography and her jugular veins are not dilated. Although serial followup is indicated I am not sure that further aggressive evaluation at this time is warranted. If she should develop shortness of breath or if there are clinical or echo signs of right heart failure, I will recommend a transesophageal echocardiogram to make sure we are not missing an atrial septal defect or anomalous pulmonary vein return with left-to-right shunt. Still finds it likely that her obesity is playing a role in her right heart disease. If this does not render an explanation for right heart disease, right heart catheterization may be warranted.

## 2013-05-17 NOTE — Progress Notes (Signed)
Patient ID: [name], female   DOB: January 04, 1981, 32 y.o.   MRN: [id number]     Reason for office visit Followup for echocardiography and suspicion of obstructive sleep apnea  The patient returns in followup for valvular heart disease. During evaluation for treatment with a new drug for multiple sclerosis it became necessary to dilate for structural heart disease. Echocardiography was performed and shows mild dilatation right heart chambers as well as tricuspid insufficiency. An echo in the past showed moderate mitral regurgitation, by the most recent evaluation this appears to be only mild. She has preserved right and left ventricular systolic function and has no symptoms of cardiac illness. The caveat is that she is quite sedentary because of severe obesity and multiple sclerosis. Per the patient's report the evaluation has shown no findings to confirm obstructive sleep apnea.    Allergies  Allergen Reactions  . Propoxyphene-Acetaminophen Rash    Current Outpatient Prescriptions  Medication Sig Dispense Refill  . ALPRAZolam (XANAX) 1 MG tablet Take 0.5 tablets (0.5 mg total) by mouth 2 (two) times daily.  60 tablet  5  . glatiramer (COPAXONE) 20 MG/ML injection Inject 20 mg into the skin daily. Take as directed      . PrednisoLONE 5 MG KIT Take 1 kit (5 mg total) by mouth as directed.  21 each  1  . QUEtiapine (SEROQUEL) 25 MG tablet Take 25 mg by mouth at bedtime. If too sedated, take 1/2 tablet      . omeprazole (PRILOSEC) 20 MG capsule 1 po bid 30 minutes before meals for 3 mos then once daily FOREVER  62 capsule  11   No current facility-administered medications for this visit.    Past Medical History  Diagnosis Date  . Hypertension   . Multiple sclerosis     Dr Wynonia Hazard  . GERD (gastroesophageal reflux disease)   . Regurgitation     cardiac valve. echo 2013 for first eval GILENYA,mod MR,TR  . Numbness in right leg 02/13,12/12  . Multiple sclerosis exacerbation     . Obesity (BMI 35.0-39.9 without comorbidity)     Past Surgical History  Procedure Laterality Date  . Cholecystectomy  2010  . Back surgery  2006  . Cesarean section  2000  . Esophagogastroduodenoscopy  08/05/2008    Fields-incomplete Schatzki's ring, distal esophageal erosions/esophagitis(mild), small HH, NEGATIVE h pylori  . Colonoscopy  09/14/2010    Fields-hyperplastic polyps, int hemorrhoids  . Cyst excision  2006    pilonoidal  . US echocardiography  03/19/2012    RV mildly dilated,RA mod. dilated,strial septum aneurysmal,Mod. MR & TR    Family History  Problem Relation Age of Onset  . Heart attack Mother   . Heart attack Father   . Coronary artery disease Father   . Cancer Father     lung  . Diabetes Father   . Colon cancer Paternal Uncle 87  . Colon cancer Paternal Grandmother 52  . Diabetes Paternal Grandmother   . Multiple sclerosis Maternal Aunt   . Cancer Maternal Grandmother 61    kind unknown  . Myasthenia gravis Maternal Grandfather     History   Social History  . Marital Status: Divorced    Spouse Name: N/A    Number of Children: 1  . Years of Education: N/A   Occupational History  . disabled    Social History Main Topics  . Smoking status: Former Smoker -- 1.00 packs/day for 20 years    Types: Cigarettes  Quit date: 02/15/2012  . Smokeless tobacco: Not on file  . Alcohol Use: No  . Drug Use: No  . Sexually Active: No   Other Topics Concern  . Not on file   Social History Narrative   Lives w/grandparents & son    Review of systems: The patient specifically denies any chest pain at rest or with exertion, dyspnea at rest or with exertion, orthopnea, paroxysmal nocturnal dyspnea, syncope, palpitations, intermittent claudication, lower extremity edema, unexplained weight gain, cough, hemoptysis or wheezing.  Denies any new focal neurological deficits but describes chronic hemianesthesia on the right side of her body attributable to  multiple sclerosis. Complains of fatigue and unsteady gait  The patient also denies abdominal pain, nausea, vomiting, dysphagia, diarrhea, constipation, polyuria, polydipsia, dysuria, hematuria, frequency, urgency, abnormal bleeding or bruising, fever, chills, unexpected weight changes, mood swings, change in skin or hair texture, change in voice quality, auditory or visual problems, allergic reactions or rashes, new musculoskeletal complaints other than usual "aches and pains".   PHYSICAL EXAM BP 118/78  Pulse 62  Resp 16  Ht 5\' 8"  (1.727 m)  Wt 275 lb 6.4 oz (124.921 kg)  BMI 41.88 kg/m2  General: Alert, oriented x3, no distress Head: no evidence of trauma, PERRL, EOMI, no exophtalmos or lid lag, no myxedema, no xanthelasma; normal ears, nose and oropharynx Neck: normal jugular venous pulsations and no hepatojugular reflux; brisk carotid pulses without delay and no carotid bruits Chest: clear to auscultation, no signs of consolidation by percussion or palpation, normal fremitus, symmetrical and full respiratory excursions Cardiovascular: normal position and quality of the apical impulse, regular rhythm, normal first and second heart sounds, no  rubs or gallops, very faint pulses felt murmur at the left lower sternal border Abdomen: no tenderness or distention, no masses by palpation, no abnormal pulsatility or arterial bruits, normal bowel sounds, no hepatosplenomegaly Extremities: no clubbing, cyanosis or edema; 2+ radial, ulnar and brachial pulses bilaterally; 2+ right femoral, posterior tibial and dorsalis pedis pulses; 2+ left femoral, posterior tibial and dorsalis pedis pulses; no subclavian or femoral bruits Neurological: grossly nonfocal   EKG: Sinus rhythm, normal tracing  Lipid Panel  No results found for this basename: chol, trig, hdl, cholhdl, vldl, ldlcalc    BMET    Component Value Date/Time   NA 139 02/28/2012 1113   NA 137 05/04/2011 0039   K 4.2 02/28/2012 1113   K  3.3* 05/04/2011 0039   CL 107 02/28/2012 1113   CL 102 05/04/2011 0039   CO2 23 02/28/2012 1113   CO2 25 05/04/2011 0039   GLUCOSE 114* 05/04/2011 0039   BUN 11 02/28/2012 1113   BUN 13 05/04/2011 0039   CREATININE 0.73 02/28/2012 1113   CREATININE 0.63 05/04/2011 0039   CALCIUM 9.5 02/28/2012 1113   CALCIUM 9.7 05/04/2011 0039   GFRNONAA >60 05/04/2011 0039   GFRAA >60 05/04/2011 0039     ASSESSMENT AND PLAN Mitral insufficiency This was described as moderate, on the most recent echocardiogram it appears to be only mild. There is no evidence of left heart dilatation and no symptoms of congestive heart failure. This is unlikely to be clinically relevant problem at this time. I don't think it's possible to say whether or not is related to use of anorexia and drugs in the past.  Severe obesity (BMI >= 40)    Tricuspid insufficiency There is moderate tricuspid insufficiency, which is likely part of the same clinical syndrome as her right atrial enlargement and right  ventricular enlargement. Reportedly she does not have sleep apnea. She does not appear to fulfill criteria for obesity hypoventilation either. By echocardiographic criteria there is no evidence of pulmonary artery hypertension. She does have an atrial septal aneurysm but there is no visible shunt across the atrial septum. Right now she does not have any signs or symptoms of congestive heart failure and the inferior vena cava is small by echocardiography and her jugular veins are not dilated. Although serial followup is indicated I am not sure that further aggressive evaluation at this time is warranted. If she should develop shortness of breath or if there are clinical or echo signs of right heart failure, I will recommend a transesophageal echocardiogram to make sure we are not missing an atrial septal defect or anomalous pulmonary vein return with left-to-right shunt. Still finds it likely that her obesity is playing a role in her right heart  disease. If this does not render an explanation for right heart disease, right heart catheterization may be warranted.   Orders Placed This Encounter  Procedures  . EKG 12-Lead   No orders of the defined types were placed in this encounter.    Junious Silk, MD, [facility] [phone] office [phone] pager

## 2013-07-17 ENCOUNTER — Other Ambulatory Visit: Payer: Self-pay | Admitting: Neurology

## 2013-08-06 ENCOUNTER — Encounter (INDEPENDENT_AMBULATORY_CARE_PROVIDER_SITE_OTHER): Payer: Self-pay

## 2013-08-06 ENCOUNTER — Ambulatory Visit (INDEPENDENT_AMBULATORY_CARE_PROVIDER_SITE_OTHER): Payer: Medicare Other | Admitting: Nurse Practitioner

## 2013-08-06 ENCOUNTER — Encounter: Payer: Self-pay | Admitting: Nurse Practitioner

## 2013-08-06 VITALS — BP 125/77 | HR 85 | Temp 98.3°F | Ht 69.0 in | Wt 276.0 lb

## 2013-08-06 DIAGNOSIS — R269 Unspecified abnormalities of gait and mobility: Secondary | ICD-10-CM | POA: Diagnosis not present

## 2013-08-06 DIAGNOSIS — IMO0001 Reserved for inherently not codable concepts without codable children: Secondary | ICD-10-CM

## 2013-08-06 DIAGNOSIS — G35 Multiple sclerosis: Secondary | ICD-10-CM | POA: Diagnosis not present

## 2013-08-06 DIAGNOSIS — F341 Dysthymic disorder: Secondary | ICD-10-CM

## 2013-08-06 DIAGNOSIS — R6889 Other general symptoms and signs: Secondary | ICD-10-CM

## 2013-08-06 DIAGNOSIS — F418 Other specified anxiety disorders: Secondary | ICD-10-CM

## 2013-08-06 DIAGNOSIS — G4700 Insomnia, unspecified: Secondary | ICD-10-CM | POA: Insufficient documentation

## 2013-08-06 MED ORDER — DULOXETINE HCL 30 MG PO CPEP: 30.0000 mg | ORAL_CAPSULE | Freq: Every day | ORAL | Status: DC

## 2013-08-06 MED ORDER — QUETIAPINE FUMARATE 25 MG PO TABS: 25.0000 mg | ORAL_TABLET | Freq: Every day | ORAL | Status: DC

## 2013-08-06 MED ORDER — DULOXETINE HCL 60 MG PO CPEP: 60.0000 mg | ORAL_CAPSULE | Freq: Every day | ORAL | Status: DC

## 2013-08-06 NOTE — Progress Notes (Signed)
[facility]  PATIENT: [name] DOB: September 24, 1981   REASON FOR VISIT: follow up HISTORY FROM: patient  HISTORY OF PRESENT ILLNESS: [name] is a 32 y.o. female here seen in follow up was originally seen as a referral from Dr. Phillips Odor . [name] was diagnosed in 2003 Ms. multiple sclerosis afterwards she lost her job and her health insurance and was lost to followup until 2009. [name] is a right-handed Caucasian female single mother and she is treated with Copaxone injections for therapy. The patient developed progressive gait impairment over the last 3 years has a history of vision impairment as optic neuritis and has complained about memory difficulties and concentration difficulties in the past an MRI obtained in 2013 MRI showed new frontal lesions, while on Copaxone. Labs were normal. She was concerned about a change to Tysabri or Gilenya. 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   Rv to discuss tysabri: patient has decided not to do it, concerned about PML even that her JC virus tested negative. Her metabolic panel was allowing for the use of Tysabri. She was happy to hear about the MRI showing no acute, agressive form of the disease.   07-10-12 patient has cardiac  eval for first dose Gilenya and was found to have a cardiac valve insufficiency.  Dr Phillips Odor mentioned her high degree of sleepiness. Sent for SPLIT study.   08-09-12  Patient had a normal PSG, no significant AHi, no PLMs, no RDI and only spontaneous arousals.anxiety/ depression  in the diff. diagnosis.  01/23/13  She has considered taking stimulant medications but couldn't use these with her heart history. Her Use of Seroquel allowed for deep sleep at a 25 mg daily dose and she feels better.  Until than she still took naps 2 hours in the morning after her son leaves for school, slept from 23.00 hours to 06.30.   Epworth sleepiness  score is not endorsed at 6 point.   08/06/13 (LL):  Ms. [name] comes in for revisit.  Her mood is very sad, and she speaks of memory problems.  She is upset that she keeps gaining weight and is afraid she will one day be in a wheelchair and she will be too big for anyone to lift her.  She states due to her MS she cannot exercise and she cannot work.  She asks for weight loss medication. Her movement is not worse, she is ambulating slowly without a cane.  She has been out of Copaxone for 2 weeks, it is ready for pickup at pharmacy.  Review of Systems:  Out of a complete 14 system review, the patient complains of only the following symptoms, and all other reviewed systems are negative.  Daytime fatigue and sleepiness, high fall risk, difficulties concentrating and memorizing. Numbness in the left hand as the most recent development attributed to MS.  ALLERGIES: Allergies  Allergen Reactions  . Propoxyphene-Acetaminophen Rash    HOME MEDICATIONS: Outpatient Prescriptions Prior to Visit  Medication Sig Dispense Refill  . ALPRAZolam (XANAX) 1 MG tablet Take 0.5 tablets (0.5 mg total) by mouth 2 (two) times daily.  60 tablet  5  . COPAXONE 20 MG/ML injection USE AS DIRECTED  1 kit  6  . PrednisoLONE 5 MG KIT Take 1 kit (5 mg total) by mouth as directed.  21 each  1  . QUEtiapine (SEROQUEL) 25 MG tablet Take 25 mg by mouth at  bedtime. If too sedated, take 1/2 tablet       No facility-administered medications prior to visit.    PAST MEDICAL HISTORY: Past Medical History  Diagnosis Date  . Hypertension   . Multiple sclerosis     Dr Wynonia Hazard  . GERD (gastroesophageal reflux disease)   . Regurgitation     cardiac valve. echo 2013 for first eval GILENYA,mod MR,TR  . Numbness in right leg 02/13,12/12  . Multiple sclerosis exacerbation   . Obesity (BMI 35.0-39.9 without comorbidity)     PAST SURGICAL HISTORY: Past Surgical History  Procedure Laterality Date  . Cholecystectomy  2010    . Back surgery  2006  . Cesarean section  2000  . Esophagogastroduodenoscopy  08/05/2008    Fields-incomplete Schatzki's ring, distal esophageal erosions/esophagitis(mild), small HH, NEGATIVE h pylori  . Colonoscopy  09/14/2010    Fields-hyperplastic polyps, int hemorrhoids  . Cyst excision  2006    pilonoidal  . US echocardiography  03/19/2012    RV mildly dilated,RA mod. dilated,strial septum aneurysmal,Mod. MR & TR    FAMILY HISTORY: Family History  Problem Relation Age of Onset  . Heart attack Mother   . Heart attack Father   . Coronary artery disease Father   . Cancer Father     lung  . Diabetes Father   . Colon cancer Paternal Uncle 45  . Colon cancer Paternal Grandmother 24  . Diabetes Paternal Grandmother   . Multiple sclerosis Maternal Aunt   . Cancer Maternal Grandmother 57    kind unknown  . Myasthenia gravis Maternal Grandfather     SOCIAL HISTORY: History   Social History  . Marital Status: Divorced    Spouse Name: N/A    Number of Children: 1  . Years of Education: N/A   Occupational History  . disabled    Social History Main Topics  . Smoking status: Former Smoker -- 1.00 packs/day for 20 years    Types: Cigarettes    Quit date: 02/15/2012  . Smokeless tobacco: Not on file  . Alcohol Use: No  . Drug Use: No  . Sexual Activity: No   Other Topics Concern  . Not on file   Social History Narrative   Lives w/grandparents & son     PHYSICAL EXAM  Filed Vitals:   08/06/13 0924  BP: 125/77  Pulse: 85  Temp: 98.3 F (36.8 C)  TempSrc: Oral  Height: 5\' 9"  (1.753 m)  Weight: 276 lb (125.193 kg)   Body mass index is 40.74 kg/(m^2).  Generalized: Well developed, in no acute distress, morbidly obese, sad affect, tearful  Head: normocephalic and atraumatic. Oropharynx benign  Neck: Supple, no carotid bruits  Cardiac: Regular rate rhythm, no murmur  Musculoskeletal: No deformity   Neurological examination   Mentation: Alert oriented to  time, place, history taking. Follows all commands speech and language fluent GDS 14, suggests severe depression. MOCA 23/30 with deficits in language repeating and fluency, delayed recall, and date. Cranial nerve II-XII:  Pupils were equal round reactive to light extraocular movements were full, visual field were full on confrontational test. Facial sensation and strength were normal. hearing was intact to finger rubbing bilaterally. Uvula tongue midline. head turning and shoulder shrug and were normal and symmetric.Tongue protrusion into cheek strength was normal. Motor: normal bulk and tone, full strength in the BUE, BLE, fine finger movements normal, no pronator drift. right hand weakness with loss of grip strength as well as dysmetria , but no drift.  Sensory: normal and symmetric to light touch, pinprick, and  vibration  Coordination: slow finger-to-nose, dysmetria on the right and left-,unable to perform rapid alternating movements and normal heel-to-shin test.  Reflexes: 2+ and symmetric throughout  Gait and Station: patient has a wider based gait, she circumducts the right leg and everts.  She extends her arms slightly to gain better balance - Her turns are en bloc.  DIAGNOSTIC DATA (LABS, IMAGING, TESTING) - I reviewed patient records, labs, notes, testing and imaging myself where available.  09/16/10 MRI scan of the brain with/wo This MRI scan of the brain shows multiple periventricular, subcortical, callosal white matter lesions consisitent with demyelinating disease. solitary left frontal enhancing lesion is noted. Presence of few T 1 black holes and atrophy suggest chronic disease. Compared to MRI 02/25/08 improvement in number of  enhancing lesions but overall no significant changes in burden of white matter lesions or atrophy.  ASSESSMENT AND PLAN Ms. Ananda Caya is a 32 year old Caucasian female with diagnosis of relapsing remitting multiple sclerosis since 2003.  Over the last 11  years her disease has progressed although it slowly and she is meanwhile developed a neurologic gait disorder.  She does have remaining vision impairment. Her sleep diagnosis is insomnia she tested negative for sleep apnea. She has mild subjectively reported mild memory difficulties- however she is clearly depressed, GDS is 14.  Mild memory difficulty likely due to severe depression. Patient denies suicidal ideation.  Resisted suggestion to seek therapist. Deferred weight loss medication to primary care.  PLAN: 1. multiple sclerosis patient will continue on Copaxone injection 20 mg daily.  2. Insomnia treated with Seroquel  3. Anxiety treated with Xanax prn.  4. Depression - Start Cymbalta, 30 mg daily for 4 weeks.  If tolerated, increase after 4 weeks to 60 mg.  Samples given for 6 weeks total. 5. Follow up in office in 6 weeks to reassess depression.   Meds ordered this encounter  Medications  . QUEtiapine (SEROQUEL) 25 MG tablet    Sig: Take 1 tablet (25 mg total) by mouth at bedtime. If too sedated, take 1/2 tablet    Dispense:  30 tablet    Refill:  5    Order Specific Question:  Supervising Provider    Answer:  DOHMEIER, CARMEN [[id number]]  . DULoxetine (CYMBALTA) 30 MG capsule    Sig: Take 1 capsule (30 mg total) by mouth daily.    Order Specific Question:  Supervising Provider    Answer:  Vickey Huger, CARMEN [[id number]]  . DULoxetine (CYMBALTA) 60 MG capsule    Sig: Take 1 capsule (60 mg total) by mouth daily.    Dispense:  30 capsule    Refill:  5    Order Specific Question:  Supervising Provider    Answer:  Vickey Huger, CARMEN [[id number]]    Tawny Asal Korri Ask, MSN, NP-C 08/06/2013, 1:16 PM [facility] [address] [place], [place] [address] [phone]

## 2013-08-13 DIAGNOSIS — Z23 Encounter for immunization: Secondary | ICD-10-CM | POA: Diagnosis not present

## 2013-09-17 ENCOUNTER — Ambulatory Visit: Payer: Medicare Other | Admitting: Nurse Practitioner

## 2013-10-19 ENCOUNTER — Other Ambulatory Visit: Payer: Self-pay | Admitting: Neurology

## 2013-10-19 DIAGNOSIS — M818 Other osteoporosis without current pathological fracture: Secondary | ICD-10-CM | POA: Diagnosis not present

## 2013-10-19 DIAGNOSIS — G35 Multiple sclerosis: Secondary | ICD-10-CM | POA: Diagnosis not present

## 2013-10-19 DIAGNOSIS — R5381 Other malaise: Secondary | ICD-10-CM | POA: Diagnosis not present

## 2013-10-20 ENCOUNTER — Encounter (INDEPENDENT_AMBULATORY_CARE_PROVIDER_SITE_OTHER): Payer: Self-pay

## 2013-10-20 ENCOUNTER — Encounter: Payer: Self-pay | Admitting: Neurology

## 2013-10-20 ENCOUNTER — Ambulatory Visit (INDEPENDENT_AMBULATORY_CARE_PROVIDER_SITE_OTHER): Payer: Medicare Other | Admitting: Neurology

## 2013-10-20 VITALS — BP 115/67 | HR 73 | Resp 18 | Ht 68.0 in | Wt 280.0 lb

## 2013-10-20 DIAGNOSIS — R5381 Other malaise: Secondary | ICD-10-CM | POA: Diagnosis not present

## 2013-10-20 DIAGNOSIS — G35 Multiple sclerosis: Secondary | ICD-10-CM | POA: Diagnosis not present

## 2013-10-20 DIAGNOSIS — R5383 Other fatigue: Secondary | ICD-10-CM

## 2013-10-20 HISTORY — DX: Other malaise: R53.81

## 2013-10-20 HISTORY — DX: Other fatigue: R53.83

## 2013-10-20 MED ORDER — DULOXETINE HCL 60 MG PO CPEP: 60.0000 mg | ORAL_CAPSULE | Freq: Every day | ORAL | Status: DC

## 2013-10-20 MED ORDER — ALPRAZOLAM 1 MG PO TABS: 0.5000 mg | ORAL_TABLET | Freq: Two times a day (BID) | ORAL | Status: DC

## 2013-10-20 MED ORDER — NUVIGIL 250 MG PO TABS: 250.0000 mg | ORAL_TABLET | Freq: Every day | ORAL | Status: DC

## 2013-10-20 MED ORDER — QUETIAPINE FUMARATE 25 MG PO TABS: 25.0000 mg | ORAL_TABLET | Freq: Every day | ORAL | Status: DC

## 2013-10-20 NOTE — Addendum Note (Signed)
Addended by: Melvyn Novas on: 10/20/2013 11:45 AM   Modules accepted: Orders

## 2013-10-20 NOTE — Progress Notes (Signed)
[facility]  Primary physician is Dr.  Assunta Found.   PATIENT: [name] DOB: 10/26/1980   REASON FOR VISIT: follow up for a patient with remitting relapsing MS,  Major depression and obesity.  HISTORY FROM: patient  HISTORY OF PRESENT ILLNESS:   Interval history:    [name] is a 32 y.o. female here seen in follow up was originally seen as a referral from Dr. Phillips Odor . [name] was diagnosed in 2003 Ms. multiple sclerosis afterwards she lost her job and her health insurance and was lost to followup until 2009. [name] is a right-handed Caucasian female single mother and she is treated with Copaxone injections for therapy. The patient developed progressive gait impairment over the last 3 years has a history of vision impairment as optic neuritis and has complained about memory difficulties and concentration difficulties in the past an MRI obtained in 2013 MRI showed new frontal lesions, while on Copaxone. Labs were normal. She was concerned about a change to Tysabri or Gilenya.  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  Dr Phillips Odor mentioned her high degree of sleepiness. Sent for SPLIT study. Patient had a normal PSG, no significant AHi, no PLMs, no RDI and only spontaneous arousals.anxiety/ depression  in the diff. diagnosis. She has considered taking stimulant medications but couldn't use these with her heart history. Her Use of Seroquel allowed for deep sleep at a 25 mg daily dose and she feels better.  Until than she still took naps 2 hours in the morning after her son leaves for school, slept from 23.00 hours to 06.30.   Epworth sleepiness score no endorsed at 6 points and her fatigue severity score at 57 pints per liter very high. [name] in her last visit performed a MOCA test , on which the patient scored 23/30 points indicating cognitive impairment or depression  related to cognitive decline. She could not repeat full sentences, generate more than 7 bruits beginning was a certain left, and all he recalled 2/5 recall ports. Today  This test was repeated,  the patient scored 26/30.today Recall 3/5 for this MOCA, she was  Again not able to repeat word for word correctly a total of 2 sentences, again generated  only 7 words  But she has no difficulties with visual  spatial formation, naming or digit span. Based on her very high level of fatigue and her inability to take stimulants to a heart history I would like for this patient to remain on antidepressant, continue with the half tablet of Seroquel at night and I will order today an MRI of the brain and cervical spine. I also will check a vitamin D level,  thyroid, metabolic panel, I recall that in the past Lyme disease test and diabetes  tests were negative.  She has a strong family history of MG in her grandfather, father had multiple MI s and is still smoking.       Last visit note : 08/06/13 (LL):  [name] comes in for revisit.  Her mood is very sad, and she speaks of memory problems.  She is upset that she keeps gaining weight and is afraid she will one day be in a wheelchair and she will be too big for anyone to lift her.  She states due to her MS she cannot exercise and she cannot work.  She asks for weight loss medication. Her  movement is not worse, she is ambulating slowly without a cane.  She has been out of Copaxone for 2 weeks, it is ready for pickup at pharmacy.  Review of Systems:  Out of a complete 14 system review, the patient complains of only the following symptoms, and all other reviewed systems are negative.  Daytime fatigue and sleepiness, high fall risk, difficulties concentrating and memorizing. Numbness in the left hand as the most recent development attributed to MS.  ALLERGIES: Allergies  Allergen Reactions  . Propoxyphene N-Acetaminophen Rash    HOME MEDICATIONS: Outpatient  Prescriptions Prior to Visit  Medication Sig Dispense Refill  . ALPRAZolam (XANAX) 1 MG tablet Take 0.5 tablets (0.5 mg total) by mouth 2 (two) times daily.  60 tablet  5  . COPAXONE 20 MG/ML injection USE AS DIRECTED  1 kit  6  . DULoxetine (CYMBALTA) 30 MG capsule Take 1 capsule (30 mg total) by mouth daily.      . DULoxetine (CYMBALTA) 60 MG capsule Take 1 capsule (60 mg total) by mouth daily.  30 capsule  5  . PrednisoLONE 5 MG KIT Take 1 kit (5 mg total) by mouth as directed.  21 each  1  . QUEtiapine (SEROQUEL) 25 MG tablet Take 1 tablet (25 mg total) by mouth at bedtime. If too sedated, take 1/2 tablet  30 tablet  5   No facility-administered medications prior to visit.    PAST MEDICAL HISTORY: Past Medical History  Diagnosis Date  . Hypertension   . Multiple sclerosis     Dr Wynonia Hazard  . GERD (gastroesophageal reflux disease)   . Regurgitation     cardiac valve. echo 2013 for first eval GILENYA,mod MR,TR  . Numbness in right leg 02/13,12/12  . Multiple sclerosis exacerbation   . Obesity (BMI 35.0-39.9 without comorbidity)     PAST SURGICAL HISTORY: Past Surgical History  Procedure Laterality Date  . Cholecystectomy  2010  . Back surgery  2006  . Cesarean section  2000  . Esophagogastroduodenoscopy  08/05/2008    Fields-incomplete Schatzki's ring, distal esophageal erosions/esophagitis(mild), small HH, NEGATIVE h pylori  . Colonoscopy  09/14/2010    Fields-hyperplastic polyps, int hemorrhoids  . Cyst excision  2006    pilonoidal  . US echocardiography  03/19/2012    RV mildly dilated,RA mod. dilated,strial septum aneurysmal,Mod. MR & TR    FAMILY HISTORY: Family History  Problem Relation Age of Onset  . Heart attack Mother   . Heart attack Father   . Coronary artery disease Father   . Cancer Father     lung  . Diabetes Father   . Colon cancer Paternal Uncle 36  . Colon cancer Paternal Grandmother 76  . Diabetes Paternal Grandmother   . Multiple sclerosis  Maternal Aunt   . Cancer Maternal Grandmother 29    kind unknown  . Myasthenia gravis Maternal Grandfather     SOCIAL HISTORY: History   Social History  . Marital Status: Divorced    Spouse Name: N/A    Number of Children: 1  . Years of Education: college   Occupational History  . disabled    Social History Main Topics  . Smoking status: Former Smoker -- 1.00 packs/day for 20 years    Types: Cigarettes    Quit date: 02/15/2012  . Smokeless tobacco: Not on file  . Alcohol Use: No  . Drug Use: No  . Sexual Activity: No   Other Topics Concern  . Not on file  Social History Narrative   Lives w/grandparents & son     PHYSICAL EXAM  Filed Vitals:   10/20/13 1031  BP: 115/67  Pulse: 73  Resp: 18  Height: 5\' 8"  (1.727 m)  Weight: 280 lb (127.007 kg)   Body mass index is 42.58 kg/(m^2).  Generalized: Well developed, in no acute distress, morbidly obese, sad affect, tearful  Head: normocephalic and atraumatic. Oropharynx benign  Neck: Supple, no carotid bruits  Cardiac: Regular rate rhythm, no murmur  Musculoskeletal: No deformity   Neurological examination   Mentation: Alert oriented to time, place, history taking. Follows all commands speech and language fluent GDS 14, suggests severe depression. MOCA 23/30 with deficits in language repeating and fluency, delayed recall, and date. Cranial nerve II-XII:  Pupils were equal round reactive to light extraocular movements were full, visual field were full on confrontational test. Facial sensation and strength were normal. hearing was intact to finger rubbing bilaterally. Uvula tongue midline. head turning and shoulder shrug and were normal and symmetric.Tongue protrusion into cheek strength was normal. Motor: normal bulk and tone, full strength in the BUE, BLE, fine finger movements normal, no pronator drift.  Her right hand weakness with loss of grip strength is again  Seen, as well as dysmetria , but no drift.   Sensory: normal and symmetric to light touch, pinprick, and  vibration  Coordination: slow finger-to-nose, dysmetria on the right and left-,unable to perform rapid alternating movements and normal heel-to-shin test.  Reflexes: 2+ and symmetric throughout  Gait and Station: patient has a wider based gait, she circumducts the right leg and everts.  She extends her arms slightly to gain better balance - Her turns are en bloc.  DIAGNOSTIC DATA (LABS, IMAGING, TESTING) - I reviewed patient records, labs, notes, testing and imaging myself where available.  09/16/10 MRI scan of the brain with/wo This MRI scan of the brain shows multiple periventricular, subcortical, callosal white matter lesions consisitent with demyelinating disease. solitary left frontal enhancing lesion is noted. Presence of few T 1 black holes and atrophy suggest chronic disease. Compared to MRI 02/25/08 improvement in number of  enhancing lesions but overall no significant changes in burden of white matter lesions or atrophy.  ASSESSMENT AND PLAN [name] is a 32 year old Caucasian female with diagnosis of relapsing remitting multiple sclerosis since 2003.  Over the last 11 years her disease has progressed although it slowly and she is meanwhile developed a neurologic gait disorder.  She does have remaining vision impairment.  Her sleep diagnosis is insomnia  As she tested negative for sleep apnea. She has mild subjectively reported mild memory difficulties- however she is clearly depressed,  and again her GDS is 14.  Mild memory difficulty likely due to severe depression.  Patient denies suicidal ideation.  Resisted suggestion to seek therapist. Deferred weight loss medication to primary care. Dr Phillips Odor.    PLAN: 1. multiple sclerosis patient will continue on Copaxone injection 20 mg daily. Cognitive deficit . 2. Insomnia treated with Seroquel , 12.5 mg at night.  3. Anxiety treated with Xanax prn. 4. Depression -  continue Cymbalta,  60 mg.  5.  MS : MRI ordered brain and c spine. Vit D and CMET, and  CK.   Melvyn Novas, MD  [address], [address] [place], [place] [address] [phone]

## 2013-10-20 NOTE — Patient Instructions (Signed)
Increase your day light exposure , Vit D OTC can be used to bolster your energy.  exercises in water will protect your joint .  Weight loss by dietary changes and moderate exercises.

## 2013-10-24 ENCOUNTER — Ambulatory Visit: Payer: Medicare Other | Admitting: Neurology

## 2013-10-27 LAB — CBC WITH DIFFERENTIAL/PLATELET
Basophils Absolute: 0 10*3/uL (ref 0.0–0.2)
Basos: 0 %
Eos: 5 %
Eosinophils Absolute: 0.5 10*3/uL — ABNORMAL HIGH (ref 0.0–0.4)
HCT: 34.7 % (ref 34.0–46.6)
Hemoglobin: 11.1 g/dL (ref 11.1–15.9)
Immature Grans (Abs): 0 10*3/uL (ref 0.0–0.1)
Immature Granulocytes: 0 %
Lymphocytes Absolute: 2.6 10*3/uL (ref 0.7–3.1)
Lymphs: 27 %
MCH: 23.2 pg — ABNORMAL LOW (ref 26.6–33.0)
MCHC: 32 g/dL (ref 31.5–35.7)
MCV: 73 fL — ABNORMAL LOW (ref 79–97)
Monocytes Absolute: 0.5 10*3/uL (ref 0.1–0.9)
Monocytes: 5 %
Neutrophils Absolute: 6.1 10*3/uL (ref 1.4–7.0)
Neutrophils Relative %: 63 %
RBC: 4.78 x10E6/uL (ref 3.77–5.28)
RDW: 15.9 % — ABNORMAL HIGH (ref 12.3–15.4)
WBC: 9.7 10*3/uL (ref 3.4–10.8)

## 2013-10-27 LAB — COMPREHENSIVE METABOLIC PANEL
ALT: 13 IU/L (ref 0–32)
AST: 18 IU/L (ref 0–40)
Albumin/Globulin Ratio: 1.7 (ref 1.1–2.5)
Albumin: 4.2 g/dL (ref 3.5–5.5)
Alkaline Phosphatase: 61 IU/L (ref 39–117)
BUN/Creatinine Ratio: 15 (ref 8–20)
BUN: 10 mg/dL (ref 6–20)
CO2: 25 mmol/L (ref 18–29)
Calcium: 9.5 mg/dL (ref 8.7–10.2)
Chloride: 100 mmol/L (ref 97–108)
Creatinine, Ser: 0.67 mg/dL (ref 0.57–1.00)
GFR calc Af Amer: 133 mL/min/{1.73_m2} (ref >59)
GFR calc non Af Amer: 115 mL/min/{1.73_m2} (ref >59)
Globulin, Total: 2.5 g/dL (ref 1.5–4.5)
Glucose: 88 mg/dL (ref 65–99)
Potassium: 4.1 mmol/L (ref 3.5–5.2)
Sodium: 139 mmol/L (ref 134–144)
Total Bilirubin: 0.2 mg/dL (ref 0.0–1.2)
Total Protein: 6.7 g/dL (ref 6.0–8.5)

## 2013-10-27 LAB — VITAMIN D, 1,25 + 25-HYDROXY
Vit D, 1,25-Dihydroxy: 49.3 pg/mL (ref 10.0–75.0)
Vit D, 25-Hydroxy: 22.5 ng/mL — ABNORMAL LOW (ref 30.0–100.0)

## 2013-11-06 ENCOUNTER — Ambulatory Visit
Admission: RE | Admit: 2013-11-06 | Discharge: 2013-11-06 | Disposition: A | Discharge status: Home / Self Care | Payer: Medicare Other | Source: Ambulatory Visit | Attending: Neurology | Admitting: Neurology

## 2013-11-06 DIAGNOSIS — G35 Multiple sclerosis: Secondary | ICD-10-CM

## 2013-11-06 DIAGNOSIS — R5383 Other fatigue: Secondary | ICD-10-CM

## 2013-11-06 MED ORDER — GADOBENATE DIMEGLUMINE 529 MG/ML IV SOLN: 20.0000 mL | Freq: Once | INTRAVENOUS | Status: DC | PRN

## 2013-11-11 ENCOUNTER — Telehealth: Payer: Self-pay | Admitting: Neurology

## 2013-11-13 NOTE — Telephone Encounter (Signed)
Spoke to patient and relayed new enhancing lesion on MRI brain, per Dr. Brett Fairy.  The doctor has discussed changing to another therapy with the patient.  The patient expressed agreement and will come to see Jeani Hawking on 11-28-13 to discuss options and get information.  The patient said she is not comfortable with Tysabri, explained Gilenya start up with her, and Tecfidera, which is what the doctor said she should consider.

## 2013-11-19 ENCOUNTER — Other Ambulatory Visit: Payer: Self-pay

## 2013-11-19 MED ORDER — DULOXETINE HCL 60 MG PO CPEP: 60.0000 mg | ORAL_CAPSULE | Freq: Every day | ORAL | Status: DC

## 2013-11-28 ENCOUNTER — Ambulatory Visit (INDEPENDENT_AMBULATORY_CARE_PROVIDER_SITE_OTHER): Payer: Medicare Other | Admitting: Nurse Practitioner

## 2013-11-28 ENCOUNTER — Encounter: Payer: Self-pay | Admitting: Nurse Practitioner

## 2013-11-28 VITALS — BP 127/79 | HR 62 | Ht 66.5 in | Wt 280.0 lb

## 2013-11-28 DIAGNOSIS — G35 Multiple sclerosis: Secondary | ICD-10-CM | POA: Diagnosis not present

## 2013-11-28 DIAGNOSIS — I059 Rheumatic mitral valve disease, unspecified: Secondary | ICD-10-CM | POA: Diagnosis not present

## 2013-11-28 DIAGNOSIS — G35D Multiple sclerosis, unspecified: Secondary | ICD-10-CM | POA: Diagnosis not present

## 2013-11-28 DIAGNOSIS — F341 Dysthymic disorder: Secondary | ICD-10-CM | POA: Diagnosis not present

## 2013-11-28 NOTE — Patient Instructions (Signed)
We will send in application for Tecfidera.  Someone from [facility] will be contacting you to send a starter pack.  Follow up in 3 months, sooner as needed.

## 2013-11-28 NOTE — Progress Notes (Signed)
PATIENT: [name] DOB: Sep 27, 1981   REASON FOR VISIT: follow up for MS HISTORY FROM: patient  HISTORY OF PRESENT ILLNESS: [name] is a 33 y.o. female here seen in follow up was originally seen as a referral from Dr. Hilma Favors . [name] was diagnosed in 2003 Ms. multiple sclerosis afterwards she lost her job and her health insurance and was lost to followup until 2009. [name] is a right-handed Caucasian female single mother and she is treated with Copaxone injections for therapy. The patient developed progressive gait impairment over the last 3 years has a history of vision impairment as optic neuritis and has complained about memory difficulties and concentration difficulties in the past an MRI obtained in 2013 MRI showed new frontal lesions, while on Copaxone. Labs were normal. She was concerned about a change to Tysabri or Gilenya.  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  Dr Benn Moulder mentioned her high degree of sleepiness. Sent for SPLIT study. Patient had a normal PSG, no significant AHi, no PLMs, no RDI and only spontaneous arousals.anxiety/ depression in the diff. diagnosis. She has considered taking stimulant medications but couldn't use these with her heart history. Her Use of Seroquel allowed for deep sleep at a 25 mg daily dose and she feels better.  Until than she still took naps 2 hours in the morning after her son leaves for school, slept from 23.00 hours to 06.30. Epworth sleepiness score no endorsed at 6 points and her fatigue severity score at 57 pints per liter very high.  [name] in her last visit performed a MOCA test , on which the patient scored 23/30 points indicating cognitive impairment or depression related to cognitive decline. She could not repeat full sentences, generate more than 7 words beginning was a certain letter, and all she recalled 2/5. Today this test was  repeated, the patient scored 26/30 today.  Recall 3/5 for this MOCA, she was again not able to repeat word for word correctly a total of 2 sentences, again generated only 7 words but she has no difficulties with visual spatial formation, naming or digit span.  Based on her very high level of fatigue and her inability to take stimulants to a heart history I would like for this patient to remain on antidepressant, continue with the half tablet of Seroquel at night and I will order today an MRI of the brain and cervical spine. I also will check a vitamin D level, thyroid, metabolic panel, I recall that in the past Lyme disease test and diabetes tests were negative.  She has a strong family history of MG in her grandfather, father had multiple MI s and is still smoking.   UPDATE 11/28/13 (LL): [name] returns for MS followup.  Since last visit, her MRI showed new enhancing lesion. She is urged to changed treatments.  She was found to have microcytic anemia and low Vitamin D.  She has started a multivitamin with iron.  She has researched other treatments and wants to start on Tecfidera.  Her mood is much better on Cymbalta.  Nuvigil has helped her daytime sleepiness but she is out of samples and insurance is requiring PA, [facility] has faxed our office the request.  She plans to join a fitness facility with her friend.  REVIEW OF SYSTEMS: Full 14 system review of systems performed and notable only for:  Memory loss, numbness, walking  difficulty.  ALLERGIES: Allergies  Allergen Reactions  . Propoxyphene N-Acetaminophen Rash    HOME MEDICATIONS: Outpatient Prescriptions Prior to Visit  Medication Sig Dispense Refill  . ALPRAZolam (XANAX) 1 MG tablet Take 0.5 tablets (0.5 mg total) by mouth 2 (two) times daily.  60 tablet  5  . COPAXONE 20 MG/ML injection USE AS DIRECTED  1 kit  6  . DULoxetine (CYMBALTA) 30 MG capsule Take 1 capsule (30 mg total) by mouth daily.      . DULoxetine (CYMBALTA) 60 MG capsule  Take 1 capsule (60 mg total) by mouth daily.  30 capsule  5  . NUVIGIL 250 MG tablet Take 1 tablet (250 mg total) by mouth daily.  30 tablet  4  . PrednisoLONE 5 MG KIT Take 1 kit (5 mg total) by mouth as directed.  21 each  1  . QUEtiapine (SEROQUEL) 25 MG tablet Take 1 tablet (25 mg total) by mouth at bedtime. If too sedated, take 1/2 tablet  30 tablet  5   No facility-administered medications prior to visit.    PAST MEDICAL HISTORY: Past Medical History  Diagnosis Date  . Hypertension   . Multiple sclerosis     Dr Lonzo Cloud  . GERD (gastroesophageal reflux disease)   . Regurgitation     cardiac valve. echo 2013 for first eval GILENYA,mod MR,TR  . Numbness in right leg 02/13,12/12  . Multiple sclerosis exacerbation   . Obesity (BMI 35.0-39.9 without comorbidity)   . Other malaise and fatigue 10/20/2013    PAST SURGICAL HISTORY: Past Surgical History  Procedure Laterality Date  . Cholecystectomy  2010  . Back surgery  2006  . Cesarean section  2000  . Esophagogastroduodenoscopy  08/05/2008    Fields-incomplete Schatzki's ring, distal esophageal erosions/esophagitis(mild), small HH, NEGATIVE h pylori  . Colonoscopy  09/14/2010    Fields-hyperplastic polyps, int hemorrhoids  . Cyst excision  2006    pilonoidal  . US echocardiography  03/19/2012    RV mildly dilated,RA mod. dilated,strial septum aneurysmal,Mod. MR & TR    FAMILY HISTORY: Family History  Problem Relation Age of Onset  . Heart attack Mother   . Heart attack Father   . Coronary artery disease Father   . Cancer Father     lung  . Diabetes Father   . Colon cancer Paternal Uncle 71  . Colon cancer Paternal Grandmother 45  . Diabetes Paternal Grandmother   . Multiple sclerosis Maternal Aunt   . Cancer Maternal Grandmother 76    kind unknown  . Myasthenia gravis Maternal Grandfather     SOCIAL HISTORY: History   Social History  . Marital Status: Married    Spouse Name: Divorced     Number of  Children: 1  . Years of Education: college   Occupational History  . disabled    Social History Main Topics  . Smoking status: Former Smoker -- 1.00 packs/day for 20 years    Types: Cigarettes    Quit date: 02/15/2012  . Smokeless tobacco: Not on file  . Alcohol Use: No  . Drug Use: No  . Sexual Activity: No   Other Topics Concern  . Not on file   Social History Narrative   Lives w/grandparents & son     PHYSICAL [name] Vitals:   11/28/13 1055  Height: 5' 6.5" (1.689 m)  Weight: 280 lb (127.007 kg)   Body mass index is 44.52 kg/(m^2).  Generalized: Well developed, in no acute distress, morbidly  obese Head: normocephalic and atraumatic. Oropharynx benign  Neck: Supple, no carotid bruits  Cardiac: Regular rate rhythm, no murmur  Musculoskeletal: No deformity   Neurological examination  Mentation: Alert oriented to time, place, history taking. Follows all commands speech and language fluent   MOCA 23/30 with deficits in language repeating and fluency, delayed recall, and date.  Cranial nerve II-XII: Pupils were equal round reactive to light extraocular movements were full, visual field were full on confrontational test. Facial sensation and strength were normal. hearing was intact to finger rubbing bilaterally. Uvula tongue midline. head turning and shoulder shrug and were normal and symmetric.Tongue protrusion into cheek strength was normal.  Motor: normal bulk and tone, full strength in the BUE, BLE, fine finger movements normal, no pronator drift.  Her right hand weakness with loss of grip strength is again Seen, as well as dysmetria , but no drift.  Sensory: normal and symmetric to light touch, pinprick, and vibration  Coordination: slow finger-to-nose, dysmetria on the right and left-,unable to perform rapid alternating movements and normal heel-to-shin test.  Reflexes: 2+ and symmetric throughout  Gait and Station: patient has a wider based gait, she circumducts  the right leg and everts.  She extends her arms slightly to gain better balance - Her turns are en bloc.   DIAGNOSTIC DATA (LABS, IMAGING, TESTING) - I reviewed patient records, labs, notes, testing and imaging myself where available.  Lab Results  Component Value Date   WBC 9.7 10/19/2013   HGB 11.1 10/19/2013   HCT 34.7 10/19/2013   MCV 73* 10/19/2013   PLT 370 05/04/2011      Component Value Date/Time   NA 139 10/19/2013 0000   K 4.1 10/19/2013 0000   CL 100 10/19/2013 0000   CO2 25 10/19/2013 0000   GLUCOSE 88 10/19/2013 0000   BUN 10 10/19/2013 0000   CREATININE 0.67 10/19/2013 0000   CALCIUM 9.5 10/19/2013 0000   PROT 6.7 10/19/2013 0000   AST 18 10/19/2013 0000   ALT 13 10/19/2013 0000   ALKPHOS 61 10/19/2013 0000   BILITOT 0.2 10/19/2013 0000   GFRNONAA 115 10/19/2013 0000   GFRAA 133 10/19/2013 0000   11/06/13 MRI scan of the cervical spine with/wo Abnormal MRI cervical spine showing illdefined spinal cord hyperintensities at C 2 and C 5 likely remote age demyelinating plaques. No enhancing lesions are noted. 11/06/13 MRI scan of the brain with/wo Abnormal MRI brain showing scatetred bilateral supratentorial white matter hyper-intensities compatible with multiple sclerosis. Bilateral frontal and left periventricular enhancing lesions indicate active inflammation. Presence of T 1 black holes and atrophy of corpus callosum indicates chronic disease.  Compared with MRI brain 09/16/10 left frontal enhancing lesion is new while white matter changes appear unchanged.11/06/13 MRI scan of the cervical spine with/wo  ASSESSMENT AND PLAN Ms. Justine [name] is a 33 year old Caucasian female with diagnosis of relapsing remitting multiple sclerosis since 2003. Over the last 11 years her disease has progressed although it slowly and she is meanwhile developed a neurologic gait disorder. She does have remaining vision impairment. Her sleep diagnosis is insomnia as she tested negative  for sleep apnea. She has mild subjectively reported mild memory difficulties- however she is clearly depressed. Mild memory difficulty likely due to severe depression. Patient denies suicidal ideation. Resisted suggestion to seek therapist. Deferred weight loss medication to primary care. Dr Hilma Favors.   New enhancing brain lesion, here to discuss changing treatments.  She has decided on Tecfidera, application was signed.  PLAN: 1. Start Tecfidera.  Application was signed and given to Butch Penny to fax. 2. Email Janett Billow about PA for [employer]. 3. Insomnia treated with Seroquel , 12.5 mg at night.  4. Anxiety treated with Xanax prn.  5. Depression - continue Cymbalta, 60 mg. 6. Keep next follow up appointment.  Philmore Pali, MSN, NP-C 11/28/2013, 10:56 AM [facility] [address] [place], [place] [address] [phone]  Note: This document was prepared with digital dictation and possible smart phrase technology. Any transcriptional errors that result from this process are unintentional.

## 2013-12-05 ENCOUNTER — Telehealth: Payer: Self-pay | Admitting: *Deleted

## 2013-12-05 NOTE — Telephone Encounter (Signed)
I called [employer] to follow up on the PA Request.  They indicate they have denied the request (they mailed denial letter, but we have not gotten it yet) stating it is being prescribed for non indicated use.  There is no other drug in this class (i.e. Provigil or Modafanil) that would be covered.  The patient must have a documented diagnosis of OSA with use of CPAP, Narcolepsy (both diagnosis' require a copy of the sleep study) or SWSD with proof of work schedule.  I called the patient back to advise of ins outcome.  She said she just got the denial letter out of the mailbox.  She would like a message sent to the provider to see if there is something else she can try.  Please advise.  Thank you.

## 2013-12-05 NOTE — Telephone Encounter (Signed)
I will forward to Dr. Brett Fairy.

## 2013-12-09 NOTE — Telephone Encounter (Signed)
i can try adderall for excessive fatigue and sleepiness, not my favorite medication- modafinil is much gentler on BP and heart rate . I will be happy to get her samples for nuvigil , that she or her partner can pick up , 1/2 of 250 mg nuvigil likely lasts for a day.

## 2013-12-11 ENCOUNTER — Telehealth: Payer: Self-pay | Admitting: Nurse Practitioner

## 2013-12-11 NOTE — Telephone Encounter (Signed)
Left message for [name] stating that she can continue to pick up samples monthly for Nuvigil, taking 1/2 of a 250 mg daily or we can call in an Rx for Adderall, per Dr. Edwena Felty suggestions.  She is to call us back with her choice.

## 2013-12-12 ENCOUNTER — Telehealth: Payer: Self-pay | Admitting: Neurology

## 2013-12-12 NOTE — Telephone Encounter (Signed)
Cammie from [place] calling to check on the status of prior authorization for patient's Tecfidera.

## 2013-12-12 NOTE — Telephone Encounter (Signed)
I contacted ins and outcome is pending at this time.  They will notify both [place] and the patient once a decision has been made.

## 2013-12-16 ENCOUNTER — Encounter: Payer: Self-pay | Admitting: Nurse Practitioner

## 2013-12-22 ENCOUNTER — Telehealth: Payer: Self-pay | Admitting: Neurology

## 2013-12-22 NOTE — Telephone Encounter (Signed)
Spoke to patient and relayed Vitamin D results, per Dr. Brett Fairy.

## 2013-12-25 ENCOUNTER — Telehealth: Payer: Self-pay | Admitting: Neurology

## 2013-12-25 NOTE — Telephone Encounter (Signed)
I called back.  Spoke with Quita Skye.  Advised her we are trying to get both the starter and continuing maintenance dose of 240mg  approved.  She will forward this info to the medical review board for determination.

## 2013-12-25 NOTE — Telephone Encounter (Signed)
Quita Skye with [employer] was calling regarding the appeal for Tecfidera.  She states that the denial was for the starter pak, however the letter that was submitted was for the 240 mg capsules which has not been denied.  They would like clarification on this.  Please call Quita Skye directly at [phone].

## 2014-01-14 ENCOUNTER — Encounter (HOSPITAL_COMMUNITY): Payer: Self-pay | Admitting: Emergency Medicine

## 2014-01-14 ENCOUNTER — Emergency Department (HOSPITAL_COMMUNITY)
Admission: EM | Admit: 2014-01-14 | Discharge: 2014-01-14 | Disposition: A | Discharge status: Home / Self Care | Payer: Medicare Other | Attending: Emergency Medicine | Admitting: Emergency Medicine

## 2014-01-14 ENCOUNTER — Telehealth: Payer: Self-pay | Admitting: Neurology

## 2014-01-14 DIAGNOSIS — K219 Gastro-esophageal reflux disease without esophagitis: Secondary | ICD-10-CM | POA: Diagnosis not present

## 2014-01-14 DIAGNOSIS — I10 Essential (primary) hypertension: Secondary | ICD-10-CM | POA: Insufficient documentation

## 2014-01-14 DIAGNOSIS — E663 Overweight: Secondary | ICD-10-CM | POA: Insufficient documentation

## 2014-01-14 DIAGNOSIS — R112 Nausea with vomiting, unspecified: Secondary | ICD-10-CM

## 2014-01-14 DIAGNOSIS — Z87891 Personal history of nicotine dependence: Secondary | ICD-10-CM | POA: Diagnosis not present

## 2014-01-14 DIAGNOSIS — Z3202 Encounter for pregnancy test, result negative: Secondary | ICD-10-CM | POA: Insufficient documentation

## 2014-01-14 DIAGNOSIS — Z79899 Other long term (current) drug therapy: Secondary | ICD-10-CM | POA: Insufficient documentation

## 2014-01-14 DIAGNOSIS — G35 Multiple sclerosis: Secondary | ICD-10-CM | POA: Diagnosis not present

## 2014-01-14 DIAGNOSIS — R197 Diarrhea, unspecified: Secondary | ICD-10-CM | POA: Insufficient documentation

## 2014-01-14 LAB — CBC WITH DIFFERENTIAL/PLATELET
Basophils Absolute: 0 10*3/uL (ref 0.0–0.1)
Basophils Relative: 0 % (ref 0–1)
Eosinophils Absolute: 0.4 10*3/uL (ref 0.0–0.7)
Eosinophils Relative: 5 % (ref 0–5)
HCT: 34.7 % — ABNORMAL LOW (ref 36.0–46.0)
Hemoglobin: 11.1 g/dL — ABNORMAL LOW (ref 12.0–15.0)
LYMPHS ABS: 0.9 10*3/uL (ref 0.7–4.0)
Lymphocytes Relative: 10 % — ABNORMAL LOW (ref 12–46)
MCH: 23 pg — AB (ref 26.0–34.0)
MCHC: 32 g/dL (ref 30.0–36.0)
MCV: 71.8 fL — ABNORMAL LOW (ref 78.0–100.0)
Monocytes Absolute: 0.5 10*3/uL (ref 0.1–1.0)
Monocytes Relative: 5 % (ref 3–12)
NEUTROS PCT: 80 % — AB (ref 43–77)
Neutro Abs: 6.8 10*3/uL (ref 1.7–7.7)
PLATELETS: 306 10*3/uL (ref 150–400)
RBC: 4.83 MIL/uL (ref 3.87–5.11)
RDW: 16.2 % — AB (ref 11.5–15.5)
WBC: 8.6 10*3/uL (ref 4.0–10.5)

## 2014-01-14 LAB — URINALYSIS, ROUTINE W REFLEX MICROSCOPIC
BILIRUBIN URINE: NEGATIVE
Glucose, UA: NEGATIVE mg/dL
Hgb urine dipstick: NEGATIVE
KETONES UR: NEGATIVE mg/dL
LEUKOCYTES UA: NEGATIVE
NITRITE: NEGATIVE
PH: 5.5 (ref 5.0–8.0)
Protein, ur: NEGATIVE mg/dL
Specific Gravity, Urine: >1.03 — ABNORMAL HIGH (ref 1.005–1.030)
UROBILINOGEN UA: 0.2 mg/dL (ref 0.0–1.0)

## 2014-01-14 LAB — COMPREHENSIVE METABOLIC PANEL
ALBUMIN: 3.6 g/dL (ref 3.5–5.2)
ALK PHOS: 57 U/L (ref 39–117)
ALT: 21 U/L (ref 0–35)
AST: 27 U/L (ref 0–37)
BUN: 8 mg/dL (ref 6–23)
CO2: 26 mEq/L (ref 19–32)
Calcium: 9.2 mg/dL (ref 8.4–10.5)
Chloride: 103 mEq/L (ref 96–112)
Creatinine, Ser: 0.75 mg/dL (ref 0.50–1.10)
GFR calc Af Amer: >90 mL/min (ref >90)
GFR calc non Af Amer: >90 mL/min (ref >90)
GLUCOSE: 104 mg/dL — AB (ref 70–99)
POTASSIUM: 4 meq/L (ref 3.7–5.3)
SODIUM: 139 meq/L (ref 137–147)
TOTAL PROTEIN: 7.3 g/dL (ref 6.0–8.3)
Total Bilirubin: <0.2 mg/dL — ABNORMAL LOW (ref 0.3–1.2)

## 2014-01-14 LAB — PREGNANCY, URINE: Preg Test, Ur: NEGATIVE

## 2014-01-14 MED ORDER — ONDANSETRON HCL 4 MG PO TABS
4.0000 mg | ORAL_TABLET | Freq: Once | ORAL | Status: AC
  Administered 2014-01-14: 4 mg via ORAL
  Filled 2014-01-14: qty 1

## 2014-01-14 MED ORDER — LOPERAMIDE HCL 2 MG PO CAPS: 2.0000 mg | ORAL_CAPSULE | ORAL | Status: DC | PRN

## 2014-01-14 MED ORDER — LOPERAMIDE HCL 2 MG PO CAPS
4.0000 mg | ORAL_CAPSULE | Freq: Once | ORAL | Status: AC
  Administered 2014-01-14: 4 mg via ORAL
  Filled 2014-01-14: qty 2

## 2014-01-14 MED ORDER — ONDANSETRON HCL 4 MG PO TABS: 4.0000 mg | ORAL_TABLET | Freq: Three times a day (TID) | ORAL | Status: DC | PRN

## 2014-01-14 NOTE — Discharge Instructions (Signed)
Viral Gastroenteritis Viral gastroenteritis is also known as stomach flu. This condition affects the stomach and intestinal tract. It can cause sudden diarrhea and vomiting. The illness typically lasts 3 to 8 days. Most people develop an immune response that eventually gets rid of the virus. While this natural response develops, the virus can make you quite ill. CAUSES  Many different viruses can cause gastroenteritis, such as rotavirus or noroviruses. You can catch one of these viruses by consuming contaminated food or water. You may also catch a virus by sharing utensils or other personal items with an infected person or by touching a contaminated surface. SYMPTOMS  The most common symptoms are diarrhea and vomiting. These problems can cause a severe loss of body fluids (dehydration) and a body salt (electrolyte) imbalance. Other symptoms may include:  Fever.  Headache.  Fatigue.  Abdominal pain. DIAGNOSIS  Your caregiver can usually diagnose viral gastroenteritis based on your symptoms and a physical exam. A stool sample may also be taken to test for the presence of viruses or other infections. TREATMENT  This illness typically goes away on its own. Treatments are aimed at rehydration. The most serious cases of viral gastroenteritis involve vomiting so severely that you are not able to keep fluids down. In these cases, fluids must be given through an intravenous line (IV). HOME CARE INSTRUCTIONS   Drink enough fluids to keep your urine clear or pale yellow. Drink small amounts of fluids frequently and increase the amounts as tolerated.  Ask your caregiver for specific rehydration instructions.  Avoid:  Foods high in sugar.  Alcohol.  Carbonated drinks.  Tobacco.  Juice.  Caffeine drinks.  Extremely hot or cold fluids.  Fatty, greasy foods.  Too much intake of anything at one time.  Dairy products until 24 to 48 hours after diarrhea stops.  You may consume probiotics.  Probiotics are active cultures of beneficial bacteria. They may lessen the amount and number of diarrheal stools in adults. Probiotics can be found in yogurt with active cultures and in supplements.  Wash your hands well to avoid spreading the virus.  Only take over-the-counter or prescription medicines for pain, discomfort, or fever as directed by your caregiver. Do not give aspirin to children. Antidiarrheal medicines are not recommended.  Ask your caregiver if you should continue to take your regular prescribed and over-the-counter medicines.  Keep all follow-up appointments as directed by your caregiver. SEEK IMMEDIATE MEDICAL CARE IF:   You are unable to keep fluids down.  You do not urinate at least once every 6 to 8 hours.  You develop shortness of breath.  You notice blood in your stool or vomit. This may look like coffee grounds.  You have abdominal pain that increases or is concentrated in one small area (localized).  You have persistent vomiting or diarrhea.  You have a fever.  The patient is a child younger than 3 months, and he or she has a fever.  The patient is a child older than 3 months, and he or she has a fever and persistent symptoms.  The patient is a child older than 3 months, and he or she has a fever and symptoms suddenly get worse.  The patient is a baby, and he or she has no tears when crying. MAKE SURE YOU:   Understand these instructions.  Will watch your condition.  Will get help right away if you are not doing well or get worse. Document Released: 10/09/2005 Document Revised: 01/01/2012 Document Reviewed: 07/26/2011   [employer] Patient Information 2014 [employer].  

## 2014-01-14 NOTE — ED Notes (Signed)
I've had diarrhea for the past 3 days. A while ago I vomited at home once per pt. Been nauseated per pt.

## 2014-01-14 NOTE — Telephone Encounter (Signed)
Spoke with patient and she has been having diarrhea, nausea since Monday, started the increase dosage of the tecfidera-240mg  , would like something to slow down the diarrhea/nausea

## 2014-01-14 NOTE — ED Provider Notes (Signed)
CSN: [id number]     Arrival date & time 01/14/14  1846 History   First MD Initiated Contact with Patient 01/14/14 1944     Chief Complaint  Patient presents with  . Diarrhea     (Consider location/radiation/quality/duration/timing/severity/associated sxs/prior Treatment) HPI  This is a 33 year old with history of hypertension, MS who presents with diarrhea. Patient reports 3 days of diarrhea. She reports nonbloody diarrhea. She recently had an increase in one of her MS medications and feels that the diarrhea may be a side effect. Patient does state that she also had one episode of nonbilious, nonbloody emesis at home. She denies any abdominal pain. She tried to call her neurologist but was unable to get in to see him. She's not taken anything for fear he may drag with her medications. She denies any urinary symptoms.  Past Medical History  Diagnosis Date  . Hypertension   . Multiple sclerosis     Dr Lonzo Cloud  . GERD (gastroesophageal reflux disease)   . Regurgitation     cardiac valve. echo 2013 for first eval GILENYA,mod MR,TR  . Numbness in right leg 02/13,12/12  . Multiple sclerosis exacerbation   . Obesity (BMI 35.0-39.9 without comorbidity)   . Other malaise and fatigue 10/20/2013   Past Surgical History  Procedure Laterality Date  . Cholecystectomy  2010  . Back surgery  2006  . Cesarean section  2000  . Esophagogastroduodenoscopy  08/05/2008    Fields-incomplete Schatzki's ring, distal esophageal erosions/esophagitis(mild), small HH, NEGATIVE h pylori  . Colonoscopy  09/14/2010    Fields-hyperplastic polyps, int hemorrhoids  . Cyst excision  2006    pilonoidal  . US echocardiography  03/19/2012    RV mildly dilated,RA mod. dilated,strial septum aneurysmal,Mod. MR & TR   Family History  Problem Relation Age of Onset  . Heart attack Mother   . Heart attack Father   . Coronary artery disease Father   . Cancer Father     lung  . Diabetes Father   . Colon cancer  Paternal Uncle 24  . Colon cancer Paternal Grandmother 34  . Diabetes Paternal Grandmother   . Multiple sclerosis Maternal Aunt   . Cancer Maternal Grandmother 79    kind unknown  . Myasthenia gravis Maternal Grandfather    History  Substance Use Topics  . Smoking status: Former Smoker -- 1.00 packs/day for 20 years    Types: Cigarettes    Quit date: 02/15/2012  . Smokeless tobacco: Not on file  . Alcohol Use: No   OB History   Grav Para Term Preterm Abortions TAB SAB Ect Mult Living                 Review of Systems  Constitutional: Negative for fever.  Eyes: Negative for visual disturbance.  Respiratory: Negative for chest tightness and shortness of breath.   Cardiovascular: Negative for chest pain.  Gastrointestinal: Positive for nausea, vomiting and diarrhea. Negative for abdominal pain.  Genitourinary: Negative for dysuria.  Musculoskeletal: Negative for back pain.  Neurological: Negative for dizziness, weakness and headaches.  All other systems reviewed and are negative.      Allergies  Propoxyphene n-acetaminophen  Home Medications   Current Outpatient Rx  Name  Route  Sig  Dispense  Refill  . ALPRAZolam (XANAX) 1 MG tablet   Oral   Take 0.5 tablets (0.5 mg total) by mouth 2 (two) times daily.   60 tablet   5     Pharmacy Fax [phone]   .  Dimethyl Fumarate (TECFIDERA) 240 MG CPDR   Oral   Take 240 mg by mouth daily.         . DULoxetine (CYMBALTA) 60 MG capsule   Oral   Take 1 capsule (60 mg total) by mouth daily.   30 capsule   5   . NUVIGIL 250 MG tablet   Oral   Take 1 tablet (250 mg total) by mouth daily.   30 tablet   4     Dispense as written.   . ranitidine (ZANTAC) 75 MG tablet   Oral   Take 75 mg by mouth 2 (two) times daily.         Marland Kitchen loperamide (IMODIUM) 2 MG capsule   Oral   Take 1 capsule (2 mg total) by mouth as needed for diarrhea or loose stools.   10 capsule   0   . ondansetron (ZOFRAN) 4 MG tablet   Oral    Take 1 tablet (4 mg total) by mouth every 8 (eight) hours as needed for nausea or vomiting.   12 tablet   0    BP 134/86  Pulse 92  Temp(Src) 99.2 F (37.3 C) (Oral)  Resp 20  Ht 5\' 8"  (1.727 m)  Wt 275 lb (124.739 kg)  BMI 41.82 kg/m2  SpO2 98%  LMP 01/04/2014 Physical Exam  Nursing note and vitals reviewed. Constitutional: She is oriented to person, place, and time. She appears well-developed and well-nourished. No distress.  Overweight  HENT:  Head: Normocephalic and atraumatic.  Eyes: Pupils are equal, round, and reactive to light.  Neck: Neck supple.  Cardiovascular: Normal rate, regular rhythm and normal heart sounds.   No murmur heard. Pulmonary/Chest: Effort normal. No respiratory distress. She has no wheezes.  Abdominal: Soft. Bowel sounds are normal. She exhibits no distension. There is no tenderness. There is no rebound and no guarding.  Musculoskeletal: She exhibits no edema.  Neurological: She is alert and oriented to person, place, and time.  Skin: Skin is warm and dry.  Psychiatric: She has a normal mood and affect.    ED Course  Procedures (including critical care time) Labs Review Labs Reviewed  URINALYSIS, ROUTINE W REFLEX MICROSCOPIC - Abnormal; Notable for the following:    Specific Gravity, Urine >1.030 (*)    All other components within normal limits  CBC WITH DIFFERENTIAL - Abnormal; Notable for the following:    Hemoglobin 11.1 (*)    HCT 34.7 (*)    MCV 71.8 (*)    MCH 23.0 (*)    RDW 16.2 (*)    Neutrophils Relative % 80 (*)    Lymphocytes Relative 10 (*)    All other components within normal limits  COMPREHENSIVE METABOLIC PANEL - Abnormal; Notable for the following:    Glucose, Bld 104 (*)    Total Bilirubin <0.2 (*)    All other components within normal limits  PREGNANCY, URINE   Imaging Review No results found.   EKG Interpretation None     Medications  loperamide (IMODIUM) capsule 4 mg (4 mg Oral Given 01/14/14 2012)   ondansetron [facility]) tablet 4 mg (4 mg Oral Given 01/14/14 2011)    MDM   Final diagnoses:  Nausea vomiting and diarrhea    Patient presents with diarrhea and one episode of emesis. Vital signs are reassuring. Patient believes symptoms may be secondary to new increase in MS medication. Basic labwork was obtained. Patient was given Imodium and Zofran. She was able to orally hydrate.  Lab work is reassuring.  This is likely viral in nature versus medication related. Have encouraged patient to followup with her neurologist. Believe patient can safely take Imodium and Zofran for symptomatic relief.   After history, exam, and medical workup I feel the patient has been appropriately medically screened and is safe for discharge home. Pertinent diagnoses were discussed with the patient. Patient was given return precautions.     Merryl Hacker, MD 01/15/14 [id number]

## 2014-01-14 NOTE — Telephone Encounter (Signed)
Pt would like for Dr. Edwena Felty nurse to call her concerning her medication Tecfidera 240 mg that pt has been taking twice a day. Pt wanst to know if she can take anything for the nausea and diarrhea that she has had since Monday and the diarrhea has gotten worse and she is afraid to eat anything. Please call pt concerning this matter. Thanks

## 2014-01-15 NOTE — Telephone Encounter (Signed)
PT called.  She is still having problems. Went to the ER last night and was told that it was side effects of the medication. Per the Pt they gave her some anti-nausea medicine and some imodium and sent her home.  She wants to know if she needs to stop taking this medicine.  She said she has lost 6 lbs in 2 days because of these effects and is not able to eat without having to run immediately to the bathroom.  Please contact as soon as possible..  Thank you

## 2014-01-15 NOTE — Telephone Encounter (Signed)
Dr. Brett Fairy, do you have any suggestion? Stay on lower dose?

## 2014-01-15 NOTE — Telephone Encounter (Signed)
TC to pt.  She has been on the tecfidera 120mg  po bid and doing well then went to 240mg  po bid on Saturday and started having diarrhea and vomiting with higher dose.  Went to ED, 01-14-14, ? SE or viral GE.  Pt stated has lost 6 lbs.  Did not take today and is better.  Did not take imodium.  What next?  Had taken betaseron, copaxone, gilenya (cannot do - has valve problem).

## 2014-01-16 NOTE — Telephone Encounter (Signed)
After consulting Dr. Brett Fairy, called Ms. Gartner about her medication.  Since going to the ER on the 25th, she did not take her Ticfidera and has not had any nausea or diarrhea.    Dr. Brett Fairy advised to take every dose of medication with banana or applesauce for 3 days.  If she is still having issues on Monday, she should call us back for further instruction.  She was in agreement.

## 2014-01-28 NOTE — Telephone Encounter (Signed)
Patient can pick up nuvigil samples.

## 2014-01-30 ENCOUNTER — Other Ambulatory Visit: Payer: Self-pay

## 2014-01-30 MED ORDER — DIMETHYL FUMARATE 240 MG PO CPDR: 240.0000 mg | DELAYED_RELEASE_CAPSULE | Freq: Two times a day (BID) | ORAL | Status: DC

## 2014-02-18 ENCOUNTER — Ambulatory Visit: Payer: Medicare Other | Admitting: Nurse Practitioner

## 2014-02-20 ENCOUNTER — Telehealth: Payer: Self-pay | Admitting: Neurology

## 2014-02-20 MED ORDER — DIMETHYL FUMARATE 120 MG PO CPDR: 120.0000 mg | DELAYED_RELEASE_CAPSULE | Freq: Two times a day (BID) | ORAL | Status: DC

## 2014-02-20 NOTE — Telephone Encounter (Signed)
Yes, Dr. Brett Fairy was ok with it. Please change it. Thx/

## 2014-02-20 NOTE — Telephone Encounter (Signed)
Patient calling to state that the 240 mg of Tecfidera was too strong for her and it was making her sick. Patient states her dosage was lowered per Lynn's instructions, but her pharmacy needs proof of the decrease of dosage. Please call and advis patient.

## 2014-02-20 NOTE — Telephone Encounter (Signed)
Rx updated and resent.  I called the patient back.  She is aware.

## 2014-02-20 NOTE — Telephone Encounter (Signed)
[name], I will be happy to contact the pharmacy, but just wanted to clarify if the dose should be 120mg  twice daily or 240mg  twice daily.  Last note I see says to take doses with a banana or apple sauce.  Just want to be certain I am sending the Rx correctly.  Thank you.

## 2014-03-03 ENCOUNTER — Telehealth: Payer: Self-pay | Admitting: Neurology

## 2014-03-03 NOTE — Telephone Encounter (Signed)
I called the number patient left as requested.  The number given is not for [facility], it is for [place].  I spoke with Tonya.  She said there is nothing they can do there.  She said I can try calling [facility] [employer] at [phone] to see if they can assist with this patient.  I called them.  Spoke with Butch Penny.  She said they would not be able to assist [place] and asked that I call [employer] back again.  I called [employer] again.  Spoke with [place].  She said they would not be able to take order for this Rx, as I would need to talk to [employer].  I explained I have already spoken to them and they said I should call [employer].  She called [employer] and said they told her they will not accept a verbal order,  She said she sent a request to them for 7 day auth on meds and they take 24 hours to process the request.  Once they process the request, they will send [place] a form to complete and return. Once this is done, they will then ship meds to the patient.  They will not allow anything to be auth via phone.  Says there is nothing further we can do at this time, we must wait for form form [employer].  I called the patient back.  Explained the entire situation.  She is aware.

## 2014-03-03 NOTE — Telephone Encounter (Signed)
Pt called states this medication Dimethyl Fumarate (TECFIDERA) 120 MG CPDR  has to be called in to [facility] at [phone]  so that pt doesn't have to paid in full for only 7 days. Please call pt concerning this matter.

## 2014-03-03 NOTE — Telephone Encounter (Signed)
I called [facility].  Spoke with [employer].  She said the package was sorted wrong for delivery and the patients package was not delivered on 05/07 as it should have been.  I tried to give her a verbal order for 7 days and she said that was not needed.  She then transferred me to the "exceptions" dept.  I spoke with Suezanne Jacquet.  He said there is no need for [place] to call in 7 day supply, as she has refills on file, and if 7 days is needed, they would just use the Rx they have and alter the quantity.  He said he will call the patient and discuss shipping options.  Says nothing further is needed on our end.  He assured me he will follow up with the patient today regarding next steps.

## 2014-03-03 NOTE — Telephone Encounter (Signed)
Pt called states they found her written prescription at [facility] and they advised pt to call us to see if Dr. Brett Fairy can call in a 7 day supply to [facility] so that they can deliver to her house. It was sent before through delivery and now it's showing in [place]. Please call pt to advise that this can be done as soon as possible. Pt is out of this prescription. Thanks

## 2014-03-10 ENCOUNTER — Telehealth: Payer: Self-pay | Admitting: Neurology

## 2014-03-10 NOTE — Telephone Encounter (Signed)
I called back.  Spoke with agent.  Patient is scheduled to get her delivery today.

## 2014-03-10 NOTE — Telephone Encounter (Signed)
Butch Penny from [employer] called back wanted the status of what is going on. Butch Penny states we need to call [employer] back to request a Re-Authorization for the medication Dimethyl Fumarate (TECFIDERA) 120 MG CPDR.  Then once that is given and approved we need to call [facility] back to get a prescription for Dimethyl Fumarate (TECFIDERA) 120 MG CPDR. If you have any questions you may contact Butch Penny at [phone] . Thanks

## 2014-06-17 ENCOUNTER — Ambulatory Visit (INDEPENDENT_AMBULATORY_CARE_PROVIDER_SITE_OTHER): Payer: Medicare Other | Admitting: Neurology

## 2014-06-17 ENCOUNTER — Encounter: Payer: Self-pay | Admitting: Neurology

## 2014-06-17 VITALS — BP 122/80 | HR 67 | Resp 16 | Ht 66.25 in | Wt 272.0 lb

## 2014-06-17 DIAGNOSIS — R269 Unspecified abnormalities of gait and mobility: Secondary | ICD-10-CM

## 2014-06-17 DIAGNOSIS — G35 Multiple sclerosis: Secondary | ICD-10-CM | POA: Insufficient documentation

## 2014-06-17 MED ORDER — DIMETHYL FUMARATE 120 MG PO CPDR: 120.0000 mg | DELAYED_RELEASE_CAPSULE | Freq: Two times a day (BID) | ORAL | Status: DC

## 2014-06-17 MED ORDER — ALPRAZOLAM 1 MG PO TABS: 0.5000 mg | ORAL_TABLET | Freq: Two times a day (BID) | ORAL | Status: DC

## 2014-06-17 MED ORDER — DULOXETINE HCL 60 MG PO CPEP: 60.0000 mg | ORAL_CAPSULE | Freq: Every day | ORAL | Status: DC

## 2014-06-17 MED ORDER — DALFAMPRIDINE ER 10 MG PO TB12: 10.0000 mg | ORAL_TABLET | Freq: Two times a day (BID) | ORAL | Status: DC

## 2014-06-17 MED ORDER — QUETIAPINE FUMARATE 25 MG PO TABS: 25.0000 mg | ORAL_TABLET | Freq: Every day | ORAL | Status: DC

## 2014-06-17 NOTE — Patient Instructions (Addendum)
Insomnia Insomnia is frequent trouble falling and/or staying asleep. Insomnia can be a long term problem or a short term problem. Both are common. Insomnia can be a short term problem when the wakefulness is related to a certain stress or worry. Long term insomnia is often related to ongoing stress during waking hours and/or poor sleeping habits. Overtime, sleep deprivation itself can make the problem worse. Every little thing feels more severe because you are overtired and your ability to cope is decreased. CAUSES   Stress, anxiety, and depression.  Poor sleeping habits.  Distractions such as TV in the bedroom.  Naps close to bedtime.  Engaging in emotionally charged conversations before bed.  Technical reading before sleep.  Alcohol and other sedatives. They may make the problem worse. They can hurt normal sleep patterns and normal dream activity.  Stimulants such as caffeine for several hours prior to bedtime.  Pain syndromes and shortness of breath can cause insomnia.  Exercise late at night.  Changing time zones may cause sleeping problems (jet lag). It is sometimes helpful to have someone observe your sleeping patterns. They should look for periods of not breathing during the night (sleep apnea). They should also look to see how long those periods last. If you live alone or observers are uncertain, you can also be observed at a sleep clinic where your sleep patterns will be professionally monitored. Sleep apnea requires a checkup and treatment. Give your caregivers your medical history. Give your caregivers observations your family has made about your sleep.  SYMPTOMS   Not feeling rested in the morning.  Anxiety and restlessness at bedtime.  Difficulty falling and staying asleep. TREATMENT   Your caregiver may prescribe treatment for an underlying medical disorders. Your caregiver can give advice or help if you are using alcohol or other drugs for self-medication. Treatment  of underlying problems will usually eliminate insomnia problems.  Medications can be prescribed for short time use. They are generally not recommended for lengthy use.  Over-the-counter sleep medicines are not recommended for lengthy use. They can be habit forming.  You can promote easier sleeping by making lifestyle changes such as:  Using relaxation techniques that help with breathing and reduce muscle tension.  Exercising earlier in the day.  Changing your diet and the time of your last meal. No night time snacks.  Establish a regular time to go to bed.  Counseling can help with stressful problems and worry.  Soothing music and white noise may be helpful if there are background noises you cannot remove.  Stop tedious detailed work at least one hour before bedtime. HOME CARE INSTRUCTIONS   Keep a diary. Inform your caregiver about your progress. This includes any medication side effects. See your caregiver regularly. Take note of:  Times when you are asleep.  Times when you are awake during the night.  The quality of your sleep.  How you feel the next day. This information will help your caregiver care for you.  Get out of bed if you are still awake after 15 minutes. Read or do some quiet activity. Keep the lights down. Wait until you feel sleepy and go back to bed.  Keep regular sleeping and waking hours. Avoid naps.  Exercise regularly.  Avoid distractions at bedtime. Distractions include watching television or engaging in any intense or detailed activity like attempting to balance the household checkbook.  Develop a bedtime ritual. Keep a familiar routine of bathing, brushing your teeth, climbing into bed at the same   time each night, listening to soothing music. Routines increase the success of falling to sleep faster.  Use relaxation techniques. This can be using breathing and muscle tension release routines. It can also include visualizing peaceful scenes. You can  also help control troubling or intruding thoughts by keeping your mind occupied with boring or repetitive thoughts like the old concept of counting sheep. You can make it more creative like imagining planting one beautiful flower after another in your backyard garden.  During your day, work to eliminate stress. When this is not possible use some of the previous suggestions to help reduce the anxiety that accompanies stressful situations. MAKE SURE YOU:   Understand these instructions.  Will watch your condition.  Will get help right away if you are not doing well or get worse. Document Released: 10/06/2000 Document Revised: 01/01/2012 Document Reviewed: 11/06/2007 [facility] Patient Information 2015 [place], [place]. This information is not intended to replace advice given to you by your health care provider. Make sure you discuss any questions you have with your health care provider.   Dalfampridine exteneded release tablets What is this medicine? DALFAMPRIDINE (dal FAM pri deen) may help improve walking in patients with multiple sclerosis. This medicine is not a cure. This medicine may be used for other purposes; ask your health care provider or pharmacist if you have questions. COMMON BRAND NAME(S): Ampyra What should I tell my health care provider before I take this medicine? They need to know if you have any of these conditions: -kidney disease -seizures -an unusual or allergic reaction to dalfampridine, other medicines, foods, dyes, or preservatives -pregnant or trying to get pregnant -breast-feeding How should I use this medicine? Take this medicine by mouth with a glass of water. Follow the directions on the prescription label. Take your medicine at regular intervals. Do not take it more often than directed. Do not cut, crush, chew, or dissolve this medicine. You can take it with or without food. A special MedGuide will be given to you by the pharmacist with each prescription and  refill. Be sure to read this information carefully each time. Talk to your pediatrician regarding the use of this medicine in children. Special care may be needed. Overdosage: If you think you've taken too much of this medicine contact a poison control center or emergency room at once. Overdosage: If you think you have taken too much of this medicine contact a poison control center or emergency room at once. NOTE: This medicine is only for you. Do not share this medicine with others. What if I miss a dose? If you miss a dose, take it as soon as you can. Do not take double or extra doses. Do not take more than 2 tablets in a 24-hour period. Separate each tablet by 12 hours. What may interact with this medicine? Other forms of dalfampridine such as 4-aminopyridine such as 4-AP or fampridine This list may not describe all possible interactions. Give your health care provider a list of all the medicines, herbs, non-prescription drugs, or dietary supplements you use. Also tell them if you smoke, drink alcohol, or use illegal drugs. Some items may interact with your medicine. What should I watch for while using this medicine? Tell your doctor or healthcare professional if your symptoms do not start to get better or if they get worse. You may get drowsy or dizzy. Do not drive, use machinery, or do anything that needs mental alertness until you know how this medicine affects you. Do not stand  or sit up quickly, especially if you are an older patient. This reduces the risk of dizzy or fainting spells. What side effects may I notice from receiving this medicine? Side effects that you should report to your doctor or health care professional as soon as possible: -allergic reactions like skin rash, itching or hives, swelling of the face, lips, or tongue -confusion -pain in the lower back or side -pain when urinating -seizures -trouble passing urine or change in the amount of urine Side effects that usually  do not require medical attention (Report these to your doctor or health care professional if they continue or are bothersome.): -constipation -nausea -pain, tingling, numbness in the hands or feet -trouble sleeping -unusually weak or tired This list may not describe all possible side effects. Call your doctor for medical advice about side effects. You may report side effects to FDA at 1-800-FDA-1088. Where should I keep my medicine? Keep out of the reach of children. Store at room temperature between 15 and 30 degrees C (59 and 86 degrees F). Throw away any unused medicine after the expiration date. NOTE: This sheet is a summary. It may not cover all possible information. If you have questions about this medicine, talk to your doctor, pharmacist, or health care provider.  2015, [employer]. (2012-04-05 16:28:16)

## 2014-06-17 NOTE — Progress Notes (Addendum)
[facility]  Primary physician is Dr.  Sharilyn Sites.   PATIENT: [name] DOB: 1981/07/28   REASON FOR VISIT: follow up for a patient with remitting relapsing MS,  Major depression and obesity.  HISTORY FROM: patient  HISTORY OF PRESENT ILLNESS:   Interval history: patient suffering from heat and humidity related weakness, fatigue. Leg pain in the right thigh. Nausea on Tacfidera, tolerated only 60 mg Bid with applesauce. Patient had a brain MRI 11-06-13 with acute enhancing lesions, left frontal. Here today to get a 60 mg bid prescription of tacfidera and to discuss Ampyra. .     Last vist - 09-2013. [name] is a 33 y.o. female here seen in follow up was originally seen as a referral from Dr. Hilma [name] . [name] was diagnosed in 2003  With . multiple sclerosis , soon afterwards she lost her job and her health insurance and was lost to followup until 2009.  [name] is a right-handed Caucasian female, a  single mother and she is treated with Copaxone injections for therapy. Her son is 37 now, she lives with a female partner.  The patient developed progressive gait impairment since 2011, she has a history of vision impairment as optic neuritis and has complained about memory difficulties and concentration difficulties in the past an MRI obtained in 2013 MRI showed new frontal lesions, while on Copaxone. Labs were normal. She was concerned about a change to Tysabri or Gilenya.  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  Dr [name] mentioned her high degree of sleepiness. Sent for SPLIT study. Patient had a normal PSG, no significant AHi, no PLMs, no RDI and only spontaneous arousals.anxiety/ depression  in the diff. diagnosis. She has considered taking stimulant medications but couldn't use these with her heart history. Her Use of Seroquel allowed for deep sleep at a 25 mg daily  dose and she feels better.  Until than she still took naps 2 hours in the morning after her son leaves for school, slept from 23.00 hours to 06.30.   Epworth sleepiness score no endorsed at 6 points and her fatigue severity score at 57 pints per liter very high. [name] in her last visit performed a MOCA test , on which the patient scored 23/30 points indicating cognitive impairment or depression related to cognitive decline. She could not repeat full sentences, generate more than 7 bruits beginning was a certain left, and all he recalled 2/5 recall ports. Today  This test was repeated,  the patient scored 26/30.today Recall 3/5 for this MOCA, she was   Again not able to repeat word for word correctly a total of 2 sentences, again generated  only 7 words  But she has no difficulties with visual  spatial formation, naming or digit span. Based on her very high level of fatigue and her inability to take stimulants to a heart history I would like for this patient to remain on antidepressant, continue with the half tablet of Seroquel at night and I will order today an MRI of the brain and cervical spine. I also will check a vitamin D level,  thyroid, metabolic panel, I recall that in the past Lyme disease test and diabetes  tests were negative.  She has a strong family history of MG in her grandfather, father had multiple MI s and is still smoking.     Last visit note :  08/06/13 (LL):  [name] comes in for revisit.  Her mood is very sad, and she speaks of memory problems.  She is upset that she keeps gaining weight and is afraid she will one day be in a wheelchair and she will be too big for anyone to lift her.  She states due to her MS she cannot exercise and she cannot work.  She asks for weight loss medication. Her movement is not worse, she is ambulating slowly without a cane.  She has been out of Copaxone for 2 weeks, it is ready for pickup at pharmacy.  Review of Systems:  Out of a complete 14  system review, the patient complains of only the following symptoms, and all other reviewed systems are negative.  Daytime fatigue and sleepiness, high fall risk, difficulties concentrating and memorizing. Numbness in the left hand as the most recent development attributed to MS.  ALLERGIES: Allergies  Allergen Reactions  . Propoxyphene N-Acetaminophen Rash    HOME MEDICATIONS: Outpatient Prescriptions Prior to Visit  Medication Sig Dispense Refill  . ALPRAZolam (XANAX) 1 MG tablet Take 0.5 tablets (0.5 mg total) by mouth 2 (two) times daily.  60 tablet  5  . Dimethyl Fumarate (TECFIDERA) 120 MG CPDR Take 120 mg by mouth 2 (two) times daily.  60 capsule  6  . DULoxetine (CYMBALTA) 60 MG capsule Take 1 capsule (60 mg total) by mouth daily.  30 capsule  5  . loperamide (IMODIUM) 2 MG capsule Take 1 capsule (2 mg total) by mouth as needed for diarrhea or loose stools.  10 capsule  0  . NUVIGIL 250 MG tablet Take 1 tablet (250 mg total) by mouth daily.  30 tablet  4  . ondansetron (ZOFRAN) 4 MG tablet Take 1 tablet (4 mg total) by mouth every 8 (eight) hours as needed for nausea or vomiting.  12 tablet  0  . ranitidine (ZANTAC) 75 MG tablet Take 75 mg by mouth 2 (two) times daily.       No facility-administered medications prior to visit.    PAST MEDICAL HISTORY: Past Medical History  Diagnosis Date  . Hypertension   . Multiple sclerosis     Dr Lonzo Cloud  . GERD (gastroesophageal reflux disease)   . Regurgitation     cardiac valve. echo 2013 for first eval GILENYA,mod MR,TR  . Numbness in right leg 02/13,12/12  . Multiple sclerosis exacerbation   . Obesity (BMI 35.0-39.9 without comorbidity)   . Other malaise and fatigue 10/20/2013    PAST SURGICAL HISTORY: Past Surgical History  Procedure Laterality Date  . Cholecystectomy  2010  . Back surgery  2006  . Cesarean section  2000  . Esophagogastroduodenoscopy  08/05/2008    Fields-incomplete Schatzki's ring, distal esophageal  erosions/esophagitis(mild), small HH, NEGATIVE h pylori  . Colonoscopy  09/14/2010    Fields-hyperplastic polyps, int hemorrhoids  . Cyst excision  2006    pilonoidal  . US echocardiography  03/19/2012    RV mildly dilated,RA mod. dilated,strial septum aneurysmal,Mod. MR & TR    FAMILY HISTORY: Family History  Problem Relation Age of Onset  . Heart attack Mother   . Heart attack Father   . Coronary artery disease Father   . Cancer Father     lung  . Diabetes Father   . Colon cancer Paternal Uncle 85  . Colon cancer Paternal Grandmother 59  . Diabetes Paternal Grandmother   . Multiple sclerosis Maternal Aunt   . Cancer Maternal Grandmother 24  kind unknown  . Myasthenia gravis Maternal Grandfather     SOCIAL HISTORY: History   Social History  . Marital Status: Married    Spouse Name: Divorced     Number of Children: 1  . Years of Education: college   Occupational History  . disabled    Social History Main Topics  . Smoking status: Former Smoker -- 1.00 packs/day for 20 years    Types: Cigarettes    Quit date: 02/15/2012  . Smokeless tobacco: Never Used  . Alcohol Use: No  . Drug Use: No  . Sexual Activity: No   Other Topics Concern  . Not on file   Social History Narrative   Patient is divorced and lives at home and he son lives with her.   Patient has one child.   Patient is disabled.   Patient has some college education.   Patient is right-handed.   Patient drinks two cups of tea daily.           PHYSICAL EXAM  Filed Vitals:   06/17/14 1459  BP: 122/80  Pulse: 67  Resp: 16  Height: 5' 6.25" (1.683 m)  Weight: 272 lb (123.378 kg)   Body mass index is 43.56 kg/(m^2).  Generalized: Well developed, in no acute distress, morbidly obese, sad affect, tearful  Head: normocephalic and atraumatic. Oropharynx benign  Neck: Supple, no carotid bruits  Cardiac: Regular rate rhythm, no murmur  Musculoskeletal: No deformity   Neurological examination    Mentation: Alert oriented to time, place, history taking. Follows all commands speech and language fluent GDS 14, suggests severe depression. [place] 23/30 with deficits in language repeating and fluency, delayed recall, and date. Cranial nerve II-XII:  Pupils were equal round reactive to light extraocular movements were full, visual field were full on confrontational test. Facial sensation and strength were normal. hearing was intact to finger rubbing bilaterally. Uvula tongue midline. head turning and shoulder shrug and were normal and symmetric.Tongue protrusion into cheek strength was normal. Motor: normal bulk and tone, full strength in the BUE, BLE, fine finger movements normal, no pronator drift.  Her right hand weakness with loss of grip strength is again  Seen, as well as dysmetria , but no drift.  Sensory: normal and symmetric to light touch, pinprick, and  vibration  Coordination: slow finger-to-nose, dysmetria on the right and left-,unable to perform rapid alternating movements and normal heel-to-shin test.  Reflexes: 2+ and symmetric throughout  Gait and Station: patient has a wider based gait, she circumducts the right leg and everts.  She extends her arms slightly to gain better balance - Her turns are en bloc.  DIAGNOSTIC DATA (LABS, IMAGING, TESTING) - I reviewed patient records, labs, notes, testing and imaging myself where available.  ASSESSMENT AND PLAN [name] is a 33 year old Caucasian female with diagnosis of relapsing remitting multiple sclerosis since 2003.  Over the last 11 years her disease has progressed although it slowly and she is meanwhile developed a neurologic gait disorder.  She does have remaining vision impairment.  Her MRI spine from January 2015 showed old lesions, but a progression of the disease. C 5 and C2 .  MRI brain 11-06-13 documented active disease with new lesions , enhancing . Left frontal .  This let to the Tacfidera consideration. Until  then on Copaxone.  ampyra walking test- 54 seconds for 6 times walk. Start now with medicine.    Her sleep diagnosis is insomnia  - she tested negative  for sleep apnea. She has mild subjectively reported mild memory difficulties- however she is clearly depressed,  and again her depression score.    Mild memory difficulty likely due to severe depression.  Patient denies suicidal ideation.  Resisted suggestion to seek therapist. Deferred weight loss medication to primary care. Dr [name].   Lost 6 pounds on tacfidera.   PLAN: 1. multiple sclerosis patient will continue on Tacfidera -  60 mg bid. Cannot tolerate 120 mg bid.  Take with apple sauce.  2. Insomnia treated with Seroquel , 12.5 mg at night.  3. Anxiety treated with Xanax prn.  4. Depression - continue Cymbalta,  60 mg.  5.  MS : MRI ordered brain and c spine. Vit D and CMET, and  CK.   Larey Seat, MD  [address], [place], [place] [address] [phone]

## 2014-06-17 NOTE — Addendum Note (Signed)
Addended by: Larey Seat on: 06/17/2014 03:34 PM   Modules accepted: Orders

## 2014-06-18 ENCOUNTER — Telehealth: Payer: Self-pay | Admitting: Neurology

## 2014-06-18 MED ORDER — QUETIAPINE FUMARATE 25 MG PO TABS: 25.0000 mg | ORAL_TABLET | Freq: Every day | ORAL | Status: DC

## 2014-06-18 MED ORDER — DULOXETINE HCL 60 MG PO CPEP: 60.0000 mg | ORAL_CAPSULE | Freq: Every day | ORAL | Status: DC

## 2014-06-18 NOTE — Telephone Encounter (Signed)
Patient stated Rx's for  DULoxetine (CYMBALTA) 60 MG capsule, QUEtiapine (SEROQUEL) 25 MG tablet, and dalfampridine 10 MG TB12 needs to be filled at [facility].  Please call anytime and advise and can leave detailed message.

## 2014-06-18 NOTE — Telephone Encounter (Signed)
It appears all meds were sent to [facility] at [place] yesterday.  I have resent Seroquel and Cymbalta to local pharmacy.  Ampyra (Dalfampridine) is a specialty med that must be filled via mail order, as local pharmacies cannot obtain this medication.  I called the patient back.  Got no answer.  Left message.

## 2014-06-18 NOTE — Telephone Encounter (Signed)
Patient returning Jessica's call

## 2014-06-18 NOTE — Telephone Encounter (Signed)
I called the patient back.  She wanted to clarify what meds were sent where.

## 2014-06-22 ENCOUNTER — Telehealth: Payer: Self-pay | Admitting: Neurology

## 2014-06-22 NOTE — Telephone Encounter (Signed)
Brandon calling to check on status of prior authorization for patient's Ampyra.

## 2014-06-22 NOTE — Telephone Encounter (Signed)
Request is pending.  Patient is currently going through the enrollment process through [place].

## 2014-06-24 ENCOUNTER — Telehealth: Payer: Self-pay | Admitting: *Deleted

## 2014-06-24 NOTE — Telephone Encounter (Signed)
Addendum : 54 seconds for 6 times 18 feet  Walking with turns .

## 2014-06-24 NOTE — Telephone Encounter (Signed)
Needing clarification about walk test, 54 sec (46ft)?   Ofv notes states 54sec (6times).  May LM [phone] x [phone].

## 2014-06-25 ENCOUNTER — Telehealth: Payer: Self-pay

## 2014-06-25 NOTE — Telephone Encounter (Signed)
LMVM for [name], of that below.  She is to call back if questions.

## 2014-06-25 NOTE — Telephone Encounter (Signed)
[employer] notified us they have approved our request for coverage on Ampyra effective until 12/21/2014 Ref ID # [id number] Fax # [id number]

## 2014-07-29 ENCOUNTER — Ambulatory Visit (INDEPENDENT_AMBULATORY_CARE_PROVIDER_SITE_OTHER): Payer: Medicare Other | Admitting: Nurse Practitioner

## 2014-07-29 ENCOUNTER — Encounter: Payer: Self-pay | Admitting: Nurse Practitioner

## 2014-07-29 VITALS — BP 118/83 | HR 70 | Wt 276.0 lb

## 2014-07-29 DIAGNOSIS — G35 Multiple sclerosis: Secondary | ICD-10-CM

## 2014-07-29 DIAGNOSIS — R269 Unspecified abnormalities of gait and mobility: Secondary | ICD-10-CM | POA: Diagnosis not present

## 2014-07-29 DIAGNOSIS — R6889 Other general symptoms and signs: Secondary | ICD-10-CM

## 2014-07-29 NOTE — Patient Instructions (Signed)
PLAN:  1. multiple sclerosis patient will continue on Tacfidera - 60 mg bid. Cannot tolerate 120 mg bid.  Take with apple sauce.  2. Insomnia treated with Seroquel , 12.5 mg at night.  3. Anxiety treated with Xanax prn.  4. Depression - continue Cymbalta, 60 mg.  5. Continue Ampyra for walking Follow up in 6 months, sooner as needed.

## 2014-07-29 NOTE — Progress Notes (Signed)
PATIENT: [name] DOB: Jul 27, 1981  REASON FOR VISIT: follow up for a patient with remitting relapsing MS, Major depression and obesity.  HISTORY FROM: patient  HISTORY OF PRESENT ILLNESS: Interval history: Started Ampyra, has been only able to tolerate once daily due to nausea. Feels like it has improved her ability to walk.  Her father who is with her states that he has seen a definite improvement in her mobility - but she still has pain in the right thigh. Last visit walking test showed: 54 seconds for 6 times 18 feet Walking with turns. Still complains of memory difficulties.  Mood is some better.  Last visit - 06/17/13: patient suffering from heat and humidity related weakness, fatigue. Leg pain in the right thigh. Nausea on Tecfidera, tolerated only 60 mg Bid with applesauce.  Patient had a brain MRI 11-06-13 with acute enhancing lesions, left frontal.  Here today to get a 60 mg bid prescription of tecfidera and to discuss Ampyra.    Last vist - 09-2013.  [name] is a 33 y.o. female here seen in follow up was originally seen as a referral from Dr. Hilma Favors. [name] was diagnosed in 2003 With . multiple sclerosis , soon afterwards she lost her job and her health insurance and was lost to followup until 2009.  [name] is a right-handed Caucasian female, a single mother and she is treated with Copaxone injections for therapy. Her son is 69 now, she lives with a female partner.  The patient developed progressive gait impairment since 2011, she has a history of vision impairment as optic neuritis and has complained about memory difficulties and concentration difficulties in the past an MRI obtained in 2013 MRI showed new frontal lesions, while on Copaxone. Labs were normal. She was concerned about a change to Tysabri or Gilenya.  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  Dr Benn Moulder mentioned her  high degree of sleepiness. Sent for SPLIT study. Patient had a normal PSG, no significant AHi, no PLMs, no RDI and only spontaneous arousals.anxiety/ depression in the diff. diagnosis. She has considered taking stimulant medications but couldn't use these with her heart history. Her Use of Seroquel allowed for deep sleep at a 25 mg daily dose and she feels better.  Until than she still took naps 2 hours in the morning after her son leaves for school, slept from 23.00 hours to 06.30. Epworth sleepiness score no endorsed at 6 points and her fatigue severity score at 57 pints per liter very high. [name] in her last visit performed a MOCA test , on which the patient scored 23/30 points indicating cognitive impairment or depression related to cognitive decline. She could not repeat full sentences, generate more than 7 bruits beginning was a certain left, and all he recalled 2/5 recall ports. Today This test was repeated, the patient scored 26/30.today  Recall 3/5 for this MOCA, she was  Again not able to repeat word for word correctly a total of 2 sentences, again generated only 7 words But she has no difficulties with visual spatial formation, naming or digit span.  Based on her very high level of fatigue and her inability to take stimulants to a heart history I would like for this patient to remain on antidepressant, continue with the half tablet of Seroquel at night and I will order today an MRI of the brain and cervical spine. I  also will check a vitamin D level, thyroid, metabolic panel, I recall that in the past Lyme disease test and diabetes tests were negative.  She has a strong family history of MG in her grandfather, father had multiple MI s and is still smoking.   REVIEW OF SYSTEMS: Full 14 system review of systems performed and notable only for: memory loss   ALLERGIES: Allergies  Allergen Reactions  . Propoxyphene N-Acetaminophen Rash    HOME MEDICATIONS: Outpatient Prescriptions Prior to  Visit  Medication Sig Dispense Refill  . ALPRAZolam (XANAX) 1 MG tablet Take 0.5 tablets (0.5 mg total) by mouth 2 (two) times daily.  60 tablet  5  . dalfampridine 10 MG TB12 Take 1 tablet (10 mg total) by mouth 2 (two) times daily.  60 tablet  5  . Dimethyl Fumarate (TECFIDERA) 120 MG CPDR Take 120 mg by mouth 2 (two) times daily.  60 capsule  5  . DULoxetine (CYMBALTA) 60 MG capsule Take 1 capsule (60 mg total) by mouth daily.  30 capsule  5  . QUEtiapine (SEROQUEL) 25 MG tablet Take 1 tablet (25 mg total) by mouth at bedtime. If too sedated, take 1/2 tablet  30 tablet  5   No facility-administered medications prior to visit.    PHYSICAL EXAM Filed Vitals:   07/29/14 1324  BP: 118/83  Pulse: 70  Weight: 276 lb (125.193 kg)   Body mass index is 44.2 kg/(m^2).  Visual Acuity Screening   Right eye Left eye Both eyes  Without correction: 20/50 20/40   With correction:      Generalized: Well developed, in no acute distress, morbidly obese, sad affect, tearful  Head: normocephalic and atraumatic. Oropharynx benign  Neck: Supple, no carotid bruits  Cardiac: Regular rate rhythm, no murmur  Musculoskeletal: No deformity   Neurological examination  Mentation: Alert oriented to time, place, history taking. Follows all commands speech and language fluent  GDS 14, suggests severe depression. [place] 23/30 with deficits in language repeating and fluency, delayed recall, and date.  Cranial nerve II-XII: Pupils were equal round reactive to light extraocular movements were full, visual field were full on confrontational test. Facial sensation and strength were normal. hearing was intact to finger rubbing bilaterally. Uvula tongue midline. head turning and shoulder shrug and were normal and symmetric.Tongue protrusion into cheek strength was normal.  Motor: normal bulk and tone, full strength in the BUE, BLE, fine finger movements normal, no pronator drift.  Her right hand weakness with loss of grip  strength is again Seen, as well as dysmetria , but no drift.  Sensory: normal and symmetric to light touch, pinprick, and vibration  Coordination: slow finger-to-nose, dysmetria on the right and left-,unable to perform rapid alternating movements and normal heel-to-shin test.  Reflexes: 2+ and symmetric throughout  Gait and Station: patient has a wider based gait, she circumducts the right leg and everts. She extends her arms slightly to gain better balance - Her turns are en bloc. 54 seconds for 6 times 18 feet Walking with turns.  DIAGNOSTIC DATA (LABS, IMAGING, TESTING) - I reviewed patient records, labs, notes, testing and imaging myself where available.  Lab Results  Component Value Date   WBC 8.6 01/14/2014   HGB 11.1* 01/14/2014   HCT 34.7* 01/14/2014   MCV 71.8* 01/14/2014   PLT 306 01/14/2014      Component Value Date/Time   NA 139 01/14/2014 2004   NA 139 10/19/2013 0000   K 4.0 01/14/2014 2004  K 4.2 02/28/2012 1113   CL 103 01/14/2014 2004   CL 107 02/28/2012 1113   CO2 26 01/14/2014 2004   CO2 23 02/28/2012 1113   GLUCOSE 104* 01/14/2014 2004   GLUCOSE 88 10/19/2013 0000   BUN 8 01/14/2014 2004   BUN 10 10/19/2013 0000   CREATININE 0.75 01/14/2014 2004   CREATININE 0.73 02/28/2012 1113   CALCIUM 9.2 01/14/2014 2004   CALCIUM 9.5 02/28/2012 1113   PROT 7.3 01/14/2014 2004   PROT 6.7 10/19/2013 0000   PROT 6.4 02/28/2012 1113   ALBUMIN 3.6 01/14/2014 2004   AST 27 01/14/2014 2004   AST 14 02/28/2012 1113   ALT 21 01/14/2014 2004   ALKPHOS 57 01/14/2014 2004   ALKPHOS 49 02/28/2012 1113   BILITOT <0.2* 01/14/2014 2004   GFRNONAA >90 01/14/2014 2004   GFRAA >90 01/14/2014 2004    ASSESSMENT: Ms. Sacora Hawbaker is a 33 year old Caucasian female with diagnosis of relapsing remitting multiple sclerosis since 2003. Over the last 11 years her disease has progressed although it slowly and she is meanwhile developed a neurologic gait disorder. She does have remaining vision impairment.   Her MRI  spine from January 2015 showed old lesions, but a progression of the disease. C 5 and C2 .  MRI brain 11-06-13 documented active disease with new lesions, enhancing. Left frontal.  This led to the Tecfidera consideration. Until then on Copaxone.  Before Ampyra walking test- 54 seconds for 6 times walk 18 feet walking with turns. Started with medicine. Not able to take twice daily due to nausea, taking once daily. After Ampyra walking test - 50 seconds for 6 times walking 18 feet with turns. Continue medicine. Her sleep diagnosis is insomnia - she tested negative for sleep apnea.  She has mild subjectively reported mild memory difficulties- however she is clearly depressed. Mild memory difficulty likely due to severe depression.  Patient denies suicidal ideation. Resisted suggestion to seek therapist.  Deferred weight loss medication to primary care. Dr Hilma Favors.  Lost 6 pounds on tecfidera.   PLAN:  1. multiple sclerosis patient will continue on Tecfidera - 60 mg bid. Cannot tolerate 120 mg bid.  Take with apple sauce.  2. Insomnia treated with Seroquel , 12.5 mg at night.  3. Anxiety treated with Xanax prn.  4. Depression - continue Cymbalta, 60 mg.  5. Continue Ampyra, try to work up to twice daily, take with applesauce. Follow up in 6 months, sooner as needed.  Rudi Rummage Shantele Reller, MSN, FNP-BC, A/GNP-C 07/29/2014, 1:29 PM [facility] [address] [place], [place] [address] [phone]  Note: This document was prepared with digital dictation and possible smart phrase technology. Any transcriptional errors that result from this process are unintentional.

## 2014-07-30 ENCOUNTER — Encounter: Payer: Self-pay | Admitting: Nurse Practitioner

## 2014-07-30 NOTE — Progress Notes (Signed)
I agree with the assessment and plan as directed by NP .The patient is known to me .   Zephan Beauchaine, MD  

## 2014-08-20 ENCOUNTER — Telehealth: Payer: Self-pay | Admitting: Neurology

## 2014-08-20 DIAGNOSIS — H532 Diplopia: Secondary | ICD-10-CM

## 2014-08-20 DIAGNOSIS — G35 Multiple sclerosis: Secondary | ICD-10-CM

## 2014-08-20 MED ORDER — PREDNISONE 20 MG PO TABS: ORAL_TABLET | ORAL | Status: DC

## 2014-08-20 NOTE — Telephone Encounter (Signed)
I called the patient back.  She is aware Rx has been sent, and will call us back if anything further is needed.

## 2014-08-20 NOTE — Telephone Encounter (Signed)
Gave 20 mg prednisone tabs on taper for the next month.

## 2014-08-20 NOTE — Telephone Encounter (Signed)
Patient calling to state that she woke up this morning with double vision and she states that's usually what happens when she's about to have an MS exacerbation, requesting that Prednisone be called into her [employer]. Please return call and advise.

## 2014-08-31 ENCOUNTER — Telehealth: Payer: Self-pay | Admitting: Neurology

## 2014-08-31 NOTE — Telephone Encounter (Signed)
Patient is calling because her vision is not any better since taking Prednisone. What should patient do?

## 2014-09-01 ENCOUNTER — Encounter: Payer: Self-pay | Admitting: Neurology

## 2014-09-01 NOTE — Telephone Encounter (Signed)
RV with me or Jeani Hawking before she leaves, please. This week.

## 2014-09-01 NOTE — Telephone Encounter (Signed)
Per Jeani Hawking, NP consult with Dr. Brett Fairy.  I called pt and she stated that the prednisone pack usually helps but not this time.   She is almost finished with this.  No pain, both eyes she states is affected, R side more.  (states that she can not move the eyeball all the way over).  No other sx.  This number the best to call her back.

## 2014-09-02 NOTE — Telephone Encounter (Signed)
Spoke with pt and she will come in tomorrow at 1530 with Jessica Adie, NP.

## 2014-09-03 ENCOUNTER — Ambulatory Visit (INDEPENDENT_AMBULATORY_CARE_PROVIDER_SITE_OTHER): Payer: Medicare Other | Admitting: Nurse Practitioner

## 2014-09-03 ENCOUNTER — Encounter: Payer: Self-pay | Admitting: Nurse Practitioner

## 2014-09-03 VITALS — Ht 67.0 in | Wt 278.1 lb

## 2014-09-03 DIAGNOSIS — R5382 Chronic fatigue, unspecified: Secondary | ICD-10-CM

## 2014-09-03 DIAGNOSIS — G35 Multiple sclerosis: Secondary | ICD-10-CM | POA: Diagnosis not present

## 2014-09-03 DIAGNOSIS — H532 Diplopia: Secondary | ICD-10-CM | POA: Diagnosis not present

## 2014-09-03 DIAGNOSIS — R269 Unspecified abnormalities of gait and mobility: Secondary | ICD-10-CM

## 2014-09-03 MED ORDER — PLEGRIDY STARTER PACK 63 & 94 MCG/0.5ML ~~LOC~~ SOPN: 1.0000 | PEN_INJECTOR | SUBCUTANEOUS | Status: DC

## 2014-09-03 NOTE — Progress Notes (Signed)
PATIENT: [name] DOB: [birth year]  REASON FOR VISIT: routine follow up for MS HISTORY FROM: patient  HISTORY OF PRESENT ILLNESS: [name] is a 33 year-old Caucasian Female with history of RRMS. She returns today for sooner followup, due to diplopia and vertigo.  On 08/20/14, she called reporting double vision. This is how her exacerbations have started in the past. Dr. Brett Fairy gave 20 mg prednisone tabs on taper for the next month.  She stated that the prednisone pack usually helps but not this time. She is almost finished with this.  No pain, both eyes she states are affected, R side more  (states that she can not move the eye all the way over horizontally). The diplopia is giving her vertigo as well.   07/29/14: Started Ampyra, has been only able to tolerate once daily due to nausea. Feels like it has improved her ability to walk.  Her father who is with her states that he has seen a definite improvement in her mobility - but she still has pain in the right thigh. Last visit walking test showed: 54 seconds for 6 times 18 feet Walking with turns. Still complains of memory difficulties.  Mood is some better.  Last visit - 06/17/13: patient suffering from heat and humidity related weakness, fatigue. Leg pain in the right thigh. Nausea on Tecfidera, tolerated only 60 mg Bid with applesauce.   Patient had a brain MRI 11-06-13 with acute enhancing lesions, left frontal.   Here today to get a 60 mg bid prescription of tecfidera and to discuss Ampyra.     Last vist - 09-2013.   [name] is a 33 y.o. female here seen in follow up was originally seen as a referral from Dr. Hilma Favors. [name] was diagnosed in 2003 With . multiple sclerosis , soon afterwards she lost her job and her health insurance and was lost to followup until 2009.   [name] is a right-handed Caucasian female, a single mother and she is treated with Copaxone injections for therapy. Her son is 21  now, she lives with a female partner.   The patient developed progressive gait impairment since 2011, she has a history of vision impairment as optic neuritis and has complained about memory difficulties and concentration difficulties in the past an MRI obtained in 2013 MRI showed new frontal lesions, while on Copaxone. Labs were normal. She was concerned about a change to Tysabri or Gilenya.   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   Dr Benn [name] mentioned her high degree of sleepiness. Sent for SPLIT study. Patient had a normal PSG, no significant AHi, no PLMs, no RDI and only spontaneous arousals.anxiety/ depression in the diff. diagnosis. She has considered taking stimulant medications but couldn't use these with her heart history. Her Use of Seroquel allowed for deep sleep at a 25 mg daily dose and she feels better.   Until than she still took naps 2 hours in the morning after her son leaves for school, slept from 23.00 hours to 06.30. Epworth sleepiness score no endorsed at 6 points and her fatigue severity score at 57 pints per liter very high. [name] in her last visit performed a MOCA test , on which the patient scored 23/30 points indicating cognitive impairment or depression related to cognitive decline. She could not repeat full sentences, generate more than 7 bruits beginning was a certain left,  and all he recalled 2/5 recall ports. Today This test was repeated, the patient scored 26/30.today   Recall 3/5 for this MOCA, she was   Again not able to repeat word for word correctly a total of 2 sentences, again generated only 7 words But she has no difficulties with visual spatial formation, naming or digit span.   Based on her very high level of fatigue and her inability to take stimulants to a heart history I would like for this patient to remain on antidepressant, continue with the half tablet of Seroquel at night and I  will order today an MRI of the brain and cervical spine. I also will check a vitamin D level, thyroid, metabolic panel, I recall that in the past Lyme disease test and diabetes tests were negative.   She has a strong family history of MG in her grandfather, father had multiple MI s and is still smoking.     ALLERGIES: Allergies  Allergen Reactions  . Propoxyphene N-Acetaminophen Rash    HOME MEDICATIONS: Outpatient Prescriptions Prior to Visit  Medication Sig Dispense Refill  . ALPRAZolam (XANAX) 1 MG tablet Take 0.5 tablets (0.5 mg total) by mouth 2 (two) times daily. 60 tablet 5  . dalfampridine 10 MG TB12 Take 1 tablet (10 mg total) by mouth 2 (two) times daily. 60 tablet 5  . DULoxetine (CYMBALTA) 60 MG capsule Take 1 capsule (60 mg total) by mouth daily. 30 capsule 5  . predniSONE (DELTASONE) 20 MG tablet Take 3 in AM for 3 days, 2 in AM for 3 days than 1 in AM for 8 days, than every other day one tab in AM for 14 45 tablet 0  . QUEtiapine (SEROQUEL) 25 MG tablet Take 1 tablet (25 mg total) by mouth at bedtime. If too sedated, take 1/2 tablet 30 tablet 5  . Dimethyl Fumarate (TECFIDERA) 120 MG CPDR Take 120 mg by mouth 2 (two) times daily. 60 capsule 5   No facility-administered medications prior to visit.    PHYSICAL EXAM Filed Vitals:   09/03/14 1518  Height: 5\' 7"  (1.702 m)  Weight: 278 lb 2 oz (126.157 kg)   Body mass index is 43.55 kg/(m^2).  Visual Acuity Screening   Right eye Left eye Both eyes  Without correction: 20/200 20/200 20/200  With correction:      Generalized: Well developed, in no acute distress, morbidly obese Head: normocephalic and atraumatic. Oropharynx benign   Neck: Supple, no carotid bruits   Cardiac: Regular rate rhythm, no murmur   Musculoskeletal: No deformity   Neurologic Exam: Mental Status: Alert, oriented, thought content appropriate. Speech fluent without evidence of aphasia. Able to follow 3 step commands without  difficulty. Cranial Nerves: Vision with the right eye remains poor since optic neuritis. Unclear disc margins. L,eft eye disc clear, full visual fields no facial asymmetry tongue and uvula in midline. V/VII-Smile symmetricVIII-grossly intact IX/X-normal gag XI-bilateral shoulder shrug XII-midline tongue extension Motor examination: shoulder right hand weakness With loss of grip strength as well asdysmetria, but no drift.  Sensory: new onset of left hand numbness in the past numbness in the right hand affecting all 5 fingers and the palm it had been reported the loss in the left hand is new but the patient also has a history of right leg loss of proprioception right-sided weakness with limp and the achiness is myalgia in both legs. She has describes soreness more than cramping. Coordination finger-to-nose dysmetria was noted on the right heel-to-shin on the  right was not able to perform.  Deep Tendon Reflexes: 2+ and symmetric throughout Plantars: upgoing on the left  Cerebellar: slow finger-to-nose dysmetria on the righ and left-,unable to perform rapid alternating movements and normal heel-to-shin test. Gait and station. The patient has a wider based gait, she circumduct the right leg and everts. She extends her arms slightly to gain better balance - Her turns are fragmented in four steps. 54 seconds for 6 times 18 feet Walking with turns.   ASSESSMENT: Ms. Yaire Kreher is a 33 year old Caucasian female with diagnosis of relapsing remitting multiple sclerosis since 2003. Over the last 11 years her disease has progressed although it slowly and she is meanwhile developed a neurologic gait disorder. She does have remaining vision impairment.   Her MRI spine from January 2015 showed old lesions, but a progression of the disease. C5 and C2 .   MRI brain 11-06-13 documented active disease with new lesions, enhancing. Left frontal.   This led to the Tecfidera consideration. Until then on  Copaxone.   Before Ampyra walking test- 54 seconds for 6 times walk 18 feet walking with turns. Started with medicine. Not able to take twice daily due to nausea, taking once daily. After Ampyra walking test - 50 seconds for 6 times walking 18 feet with turns. Continue medicine. Her sleep diagnosis is insomnia - she tested negative for sleep apnea.   She has mild subjectively reported mild memory difficulties- however she is clearly depressed. Mild memory difficulty likely due to severe depression.   Patient denies suicidal ideation. Resisted suggestion to seek therapist.   Deferred weight loss medication to primary care. Dr Hilma Favors.   Lost 6 pounds on tecfidera.   PLAN: 1. multiple sclerosis patient - with exacerbation, 3 days of solu-Medrol through [place] [facility]. Repeat MRI Brain and Cervical Spine at MRI. 2. Switch to Plegridy injections, discontinue Tecfidera since not able to tolerate full dose.  Paperwork completed and faxed to [facility]. 3. Insomnia treated with Seroquel , 12.5 mg at night.   4. Anxiety treated with Xanax prn.   5. Depression - continue Cymbalta, 60 mg.   6. Continue Ampyra, try to work up to twice daily, take with applesauce. Follow up in 1 month with Dr. Brett Fairy.  Orders Placed This Encounter  Procedures  . MR Brain W Wo Contrast  . MR Cervical Spine W Wo Contrast  . Ambulatory referral to [facility] ordered this encounter  Medications  . PLEGRIDY STARTER PACK 63 & 94 MCG/0.5ML SOPN    Sig: Inject 1 Syringe into the skin every 14 (fourteen) days.    Order Specific Question:  Supervising Provider    Answer:  Brett Fairy, [place]   Return in about 4 weeks (around 10/01/2014) for MS.  Rudi Rummage Carman Essick, MSN, FNP-BC, A/GNP-C 09/03/2014, 5:49 PM [facility] [address] [place], [place] [address] [phone]  Note: This document was prepared with digital dictation and possible smart phrase technology. Any transcriptional  errors that result from this process are unintentional.

## 2014-09-03 NOTE — Patient Instructions (Signed)
We are ordering 3 days of IV Solu-Medrol, to be given by [facility], starting ASAP.  Someone will call to schedule.  Hoping for tomorrow.  We want to change to Plegridy. Keep taking Tecfidera until we get shots started, you will be contacted by [employer] to get consent or shipping info.  Someone will come out to train you on injections.  Repeat MRI Brain and Cervical Spine at [facility].  Must be cleared by insurance first.  Follow up in 1 month with Dr. Brett Fairy.

## 2014-09-06 DIAGNOSIS — Z452 Encounter for adjustment and management of vascular access device: Secondary | ICD-10-CM | POA: Diagnosis not present

## 2014-09-06 DIAGNOSIS — H469 Unspecified optic neuritis: Secondary | ICD-10-CM | POA: Diagnosis not present

## 2014-09-06 DIAGNOSIS — Z7952 Long term (current) use of systemic steroids: Secondary | ICD-10-CM | POA: Diagnosis not present

## 2014-09-06 DIAGNOSIS — Z9181 History of falling: Secondary | ICD-10-CM | POA: Diagnosis not present

## 2014-09-06 DIAGNOSIS — R42 Dizziness and giddiness: Secondary | ICD-10-CM | POA: Diagnosis not present

## 2014-09-06 DIAGNOSIS — H532 Diplopia: Secondary | ICD-10-CM | POA: Diagnosis not present

## 2014-09-06 DIAGNOSIS — Z6841 Body Mass Index (BMI) 40.0 and over, adult: Secondary | ICD-10-CM | POA: Diagnosis not present

## 2014-09-06 DIAGNOSIS — F419 Anxiety disorder, unspecified: Secondary | ICD-10-CM | POA: Diagnosis not present

## 2014-09-06 DIAGNOSIS — F329 Major depressive disorder, single episode, unspecified: Secondary | ICD-10-CM | POA: Diagnosis not present

## 2014-09-06 DIAGNOSIS — G35 Multiple sclerosis: Secondary | ICD-10-CM | POA: Diagnosis not present

## 2014-09-07 DIAGNOSIS — F329 Major depressive disorder, single episode, unspecified: Secondary | ICD-10-CM | POA: Diagnosis not present

## 2014-09-07 DIAGNOSIS — H532 Diplopia: Secondary | ICD-10-CM | POA: Diagnosis not present

## 2014-09-07 DIAGNOSIS — F419 Anxiety disorder, unspecified: Secondary | ICD-10-CM | POA: Diagnosis not present

## 2014-09-07 DIAGNOSIS — R42 Dizziness and giddiness: Secondary | ICD-10-CM | POA: Diagnosis not present

## 2014-09-07 DIAGNOSIS — H469 Unspecified optic neuritis: Secondary | ICD-10-CM | POA: Diagnosis not present

## 2014-09-07 DIAGNOSIS — G35 Multiple sclerosis: Secondary | ICD-10-CM | POA: Diagnosis not present

## 2014-09-07 NOTE — Progress Notes (Signed)
I agree with the assessment and plan as directed by NP .The patient is known to me .   Kaleiah Kutzer, MD  

## 2014-09-08 DIAGNOSIS — H469 Unspecified optic neuritis: Secondary | ICD-10-CM | POA: Diagnosis not present

## 2014-09-08 DIAGNOSIS — F329 Major depressive disorder, single episode, unspecified: Secondary | ICD-10-CM | POA: Diagnosis not present

## 2014-09-08 DIAGNOSIS — R42 Dizziness and giddiness: Secondary | ICD-10-CM | POA: Diagnosis not present

## 2014-09-08 DIAGNOSIS — H532 Diplopia: Secondary | ICD-10-CM | POA: Diagnosis not present

## 2014-09-08 DIAGNOSIS — G35 Multiple sclerosis: Secondary | ICD-10-CM | POA: Diagnosis not present

## 2014-09-08 DIAGNOSIS — F419 Anxiety disorder, unspecified: Secondary | ICD-10-CM | POA: Diagnosis not present

## 2014-09-10 DIAGNOSIS — H532 Diplopia: Secondary | ICD-10-CM | POA: Diagnosis not present

## 2014-09-10 DIAGNOSIS — H469 Unspecified optic neuritis: Secondary | ICD-10-CM | POA: Diagnosis not present

## 2014-09-10 DIAGNOSIS — F329 Major depressive disorder, single episode, unspecified: Secondary | ICD-10-CM | POA: Diagnosis not present

## 2014-09-10 DIAGNOSIS — R42 Dizziness and giddiness: Secondary | ICD-10-CM | POA: Diagnosis not present

## 2014-09-10 DIAGNOSIS — F419 Anxiety disorder, unspecified: Secondary | ICD-10-CM | POA: Diagnosis not present

## 2014-09-10 DIAGNOSIS — G35 Multiple sclerosis: Secondary | ICD-10-CM | POA: Diagnosis not present

## 2014-09-14 DIAGNOSIS — H532 Diplopia: Secondary | ICD-10-CM | POA: Diagnosis not present

## 2014-09-14 DIAGNOSIS — G35 Multiple sclerosis: Secondary | ICD-10-CM | POA: Diagnosis not present

## 2014-09-14 DIAGNOSIS — F419 Anxiety disorder, unspecified: Secondary | ICD-10-CM | POA: Diagnosis not present

## 2014-09-14 DIAGNOSIS — H469 Unspecified optic neuritis: Secondary | ICD-10-CM | POA: Diagnosis not present

## 2014-09-14 DIAGNOSIS — F329 Major depressive disorder, single episode, unspecified: Secondary | ICD-10-CM | POA: Diagnosis not present

## 2014-09-14 DIAGNOSIS — R42 Dizziness and giddiness: Secondary | ICD-10-CM | POA: Diagnosis not present

## 2014-09-15 ENCOUNTER — Ambulatory Visit
Admission: RE | Admit: 2014-09-15 | Discharge: 2014-09-15 | Disposition: A | Discharge status: Home / Self Care | Payer: Medicare Other | Source: Ambulatory Visit | Attending: Nurse Practitioner | Admitting: Nurse Practitioner

## 2014-09-15 DIAGNOSIS — R269 Unspecified abnormalities of gait and mobility: Secondary | ICD-10-CM

## 2014-09-15 DIAGNOSIS — H532 Diplopia: Secondary | ICD-10-CM | POA: Diagnosis not present

## 2014-09-15 DIAGNOSIS — G35 Multiple sclerosis: Secondary | ICD-10-CM

## 2014-09-15 DIAGNOSIS — R5382 Chronic fatigue, unspecified: Secondary | ICD-10-CM

## 2014-09-15 MED ORDER — GADOBENATE DIMEGLUMINE 529 MG/ML IV SOLN
20.0000 mL | Freq: Once | INTRAVENOUS | Status: AC | PRN
  Administered 2014-09-15: 20 mL via INTRAVENOUS

## 2014-09-21 ENCOUNTER — Telehealth: Payer: Self-pay | Admitting: Nurse Practitioner

## 2014-09-21 NOTE — Telephone Encounter (Signed)
I called pt and relayed the results of MRI brain and C spine.   No significant changes since last MRI done 10-2013.  She verbalized understanding.  Still with vision problems.  ? Why?  Needs appeal on plegridy.

## 2014-09-21 NOTE — Telephone Encounter (Signed)
Pt is calling to get MRI results from 09/15/14.  Please call and advise.

## 2014-09-22 NOTE — Telephone Encounter (Signed)
plegridy repeal to  Norva Pavlov for further guidance.

## 2014-09-22 NOTE — Telephone Encounter (Signed)
Ins has denied our initial request for coverage on Plegridy.  All clinical documentation for appeal has been faxed to [facility] at [place]. Ref # [id number] (Starter) and [id number] (Maintenance Dose).  I called the patient to advise.  She is aware.

## 2014-10-19 ENCOUNTER — Encounter: Payer: Self-pay | Admitting: Neurology

## 2014-10-19 ENCOUNTER — Ambulatory Visit (INDEPENDENT_AMBULATORY_CARE_PROVIDER_SITE_OTHER): Payer: Medicare Other | Admitting: Neurology

## 2014-10-19 VITALS — BP 120/71 | HR 80 | Resp 17 | Ht 68.0 in | Wt 258.0 lb

## 2014-10-19 DIAGNOSIS — H532 Diplopia: Secondary | ICD-10-CM | POA: Insufficient documentation

## 2014-10-19 DIAGNOSIS — G35 Multiple sclerosis: Secondary | ICD-10-CM | POA: Diagnosis not present

## 2014-10-19 MED ORDER — DULOXETINE HCL 60 MG PO CPEP: 60.0000 mg | ORAL_CAPSULE | Freq: Every day | ORAL | Status: DC

## 2014-10-19 MED ORDER — QUETIAPINE FUMARATE 25 MG PO TABS: 25.0000 mg | ORAL_TABLET | Freq: Every day | ORAL | Status: DC

## 2014-10-19 NOTE — Patient Instructions (Signed)
Peginterferon beta-1a What is this medicine? PEGINTERFERON BETA-1a (peg in ter FEER on BAY ta) helps to decrease the number of multiple sclerosis attacks and to slow disability in people with relapsing forms of the disease. This medicine does not cure multiple sclerosis. This medicine may be used for other purposes; ask your health care provider or pharmacist if you have questions. COMMON BRAND NAME(S): PLEGRIDY What should I tell my health care provider before I take this medicine? They need to know if you have any of these conditions: -bleeding disorders -heart disease -history of irregular heartbeat -if you often drink alcohol -immune system problems -kidney disease -liver disease -low blood counts, like low white cell, platelet, or red cell counts -mental illness -seizures -suicidal thoughts, plans, or attempt; a previous suicide attempt by you or a family member -thyroid disease -an unusual or allergic reaction to interferon, peginterferon, or other medicines, foods, dyes, or preservatives -pregnant or trying to get pregnant -breast-feeding How should I use this medicine? This medicine is for injection under the skin. You will be taught how to prepare and give this medicine. Remove the injection from the refrigerator about 30 minutes prior to use so it can warm to room temperature. Do not use heat sources such as hot water to warm the injection. Use exactly as directed. Take your medicine at regular intervals. Do not take it more often than directed. It is important that you put your used needles and syringes in a special sharps container. Do not put them in a trash can. If you do not have a sharps container, call your pharmacist or healthcare provider to get one. A special MedGuide will be given to you by the pharmacist with each prescription and refill. Be sure to read this information carefully each time. Talk to your pediatrician regarding the use of this medicine in children.  Special care may be needed. Overdosage: If you think you've taken too much of this medicine contact a poison control center or emergency room at once. Overdosage: If you think you have taken too much of this medicine contact a poison control center or emergency room at once. NOTE: This medicine is only for you. Do not share this medicine with others. What if I miss a dose? If you miss a dose, take it as soon as you can. If it is almost time for your next dose, take only that dose. Do not take double or extra doses. What may interact with this medicine? Interactions have not been studied. This list may not describe all possible interactions. Give your health care provider a list of all the medicines, herbs, non-prescription drugs, or dietary supplements you use. Also tell them if you smoke, drink alcohol, or use illegal drugs. Some items may interact with your medicine. What should I watch for while using this medicine? Tell your doctor or healthcare professional if your symptoms do not start to get better or if they get worse. You may need blood work done while you are taking this medicine. Flu-like symptoms are common with the medicine. Ask your doctor or healthcare professional about taking acetaminophen or ibuprofen before your dose and for 24 hours after you receive your injection. What side effects may I notice from receiving this medicine? Side effects that you should report to your doctor or health care professional as soon as possible: -a skin sore with a black-blue color, swelling, or drainage -allergic reactions like skin rash, itching or hives, swelling of the face, lips, or tongue -breathing problems -  chest pain or palpitations -depressed mood -dizziness -feeling faint or lightheaded -suicidal thoughts or other mood changes -seizures -signs and symptoms of infection like fever or chills; cough; sore throat; pain or trouble passing urine -signs and symptoms of liver injury like  dark yellow or brown urine; general ill feeling or flu-like symptoms; light-colored stools; loss of appetite; nausea; right upper belly pain; unusually weak or tired; yellowing of the eyes or skin -stomach pain -unusual bleeding or bruising -unusually weak or tired Side effects that usually do not require medical attention (Report these to your doctor or health care professional if they continue or are bothersome.): -fever -general ill feeling or flu-like symptoms -headache -joint pain -missed menstrual periods -muscle pain -pain, redness, or irritation at site where injected -weak or tired This list may not describe all possible side effects. Call your doctor for medical advice about side effects. You may report side effects to FDA at 1-800-FDA-1088. Where should I keep my medicine? Keep out of the reach of children. Keep this medicine in the original carton. Protect from light. Store in a refrigerator between 2 and 8 degrees C (36 and 46 degrees F). Do not freeze. If refrigeration is not available, store at cool room temperature between 2 and 25 degrees C (36 and 77 degrees F) for a period up to 30 days. The injection can be removed from, and returned to, a refrigerator if necessary. The total combined time out of refrigeration should not exceed 30 days. Make sure you do not expose the injection to heat and do not store it at temperatures warmer than those listed. Throw away any unused medicine after the expiration date on the label. NOTE: This sheet is a summary. It may not cover all possible information. If you have questions about this medicine, talk to your doctor, pharmacist, or health care provider.  2015, [employer]vier/[employer] Standard. (2014-02-02 17:30:38)

## 2014-10-19 NOTE — Progress Notes (Signed)
[name]: Jessica [name] DOB: [birth year]  REASON FOR VISIT: routine follow up for MS HISTORY FROM: [name]  HISTORY OF PRESENT ILLNESS: Jessica [name] is a 33 year-old Caucasian Female with history of RRMS. She returns today for sooner followup, due to diplopia and vertigo.  On 08/20/14, she called reporting double vision. This is how her exacerbations have started in Jessica past. Dr. Brett Fairy gave 20 mg prednisone tabs on taper for Jessica next month.  She stated that Jessica prednisone pack usually helps but not this time. She is almost finished with this.  No pain, both eyes she states are affected, R side more  (states that she can not move Jessica eye all Jessica way over horizontally). Jessica diplopia is giving her vertigo as well.   07/29/14: Started Ampyra, has been only able to tolerate once daily due to nausea. Feels like it has improved her ability to walk.  Her father who is with her states that he has seen a definite improvement in her mobility - but she still has pain in Jessica right thigh. Last visit walking test showed: 54 seconds for 6 times 18 feet Walking with turns. Still complains of memory difficulties.  Mood is some better.  Last visit - 06/17/13: [name] suffering from heat and humidity related weakness, fatigue. Leg pain in Jessica right thigh. Nausea on Tecfidera, tolerated only 60 mg Bid with applesauce.   [name] had a brain MRI 11-06-13 with acute enhancing lesions, left frontal.   Here today to get a 60 mg bid prescription of tecfidera and to discuss Ampyra.     Last vist - 09-2013.   Jessica [name] is a 33 y.o. female here seen in follow up was originally seen as a referral from Dr. Hilma Favors. Jessica [name] was diagnosed in 2003 With . multiple sclerosis , soon afterwards she lost her job and her health insurance and was lost to followup until 2009.   Jessica [name] is a right-handed Caucasian female, a single mother and she is treated with Copaxone injections for therapy. Her son is 21  now, she lives with a female partner.   Jessica [name] developed progressive gait impairment since 2011, she has a history of vision impairment as optic neuritis and has complained about memory difficulties and concentration difficulties in Jessica past an MRI obtained in 2013 MRI showed new frontal lesions, while on Copaxone. Labs were normal. She was concerned about a change to Tysabri or Gilenya.   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   Dr Benn Moulder mentioned her high degree of sleepiness. Sent for SPLIT study. [name] had a normal PSG, no significant AHi, no PLMs, no RDI and only spontaneous arousals.anxiety/ depression in Jessica diff. diagnosis. She has considered taking stimulant medications but couldn't use these with her heart history. Her Use of Seroquel allowed for deep sleep at a 25 mg daily dose and she feels better.   Until than she still took naps 2 hours in Jessica morning after her son leaves for school, slept from 23.00 hours to 06.30. Epworth sleepiness score no endorsed at 6 points and her fatigue severity score at 57 pints per liter very high. Jessica [name] in her last visit performed a MOCA test , on which Jessica [name] scored 23/30 points indicating cognitive impairment or depression related to cognitive decline. She could not repeat full sentences, generate more than 7 bruits beginning was a certain left,  and all he recalled 2/5 recall ports. Today This test was repeated, Jessica [name] scored 26/30.today   Recall 3/5 for this MOCA, she was   Again not able to repeat word for word correctly a total of 2 sentences, again generated only 7 words But she has no difficulties with visual spatial formation, naming or digit span.   Based on her very high level of fatigue and her inability to take stimulants to a heart history I would like for this [name] to remain on antidepressant, continue with Jessica half tablet of Seroquel at night and I  will order today an MRI of Jessica brain and cervical spine. I also will check a vitamin D level, thyroid, metabolic panel, I recall that in Jessica past Lyme disease test and diabetes tests were negative.   She has a strong family history of MG in her grandfather, father had multiple MI s and is still smoking.     ALLERGIES: Allergies  Allergen Reactions  . Propoxyphene N-Acetaminophen Rash    HOME MEDICATIONS: Outpatient Prescriptions Prior to Visit  Medication Sig Dispense Refill  . ALPRAZolam (XANAX) 1 MG tablet Take 0.5 tablets (0.5 mg total) by mouth 2 (two) times daily. 60 tablet 5  . dalfampridine 10 MG TB12 Take 1 tablet (10 mg total) by mouth 2 (two) times daily. 60 tablet 5  . DULoxetine (CYMBALTA) 60 MG capsule Take 1 capsule (60 mg total) by mouth daily. 30 capsule 5  . QUEtiapine (SEROQUEL) 25 MG tablet Take 1 tablet (25 mg total) by mouth at bedtime. If too sedated, take 1/2 tablet 30 tablet 5  . PLEGRIDY STARTER PACK 63 & 94 MCG/0.5ML SOPN Inject 1 Syringe into Jessica skin every 14 (fourteen) days. ([name] not taking: Reported on 10/19/2014)    . predniSONE (DELTASONE) 20 MG tablet Take 3 in AM for 3 days, 2 in AM for 3 days than 1 in AM for 8 days, than every other day one tab in AM for 14 45 tablet 0   No facility-administered medications prior to visit.    PHYSICAL EXAM Filed Vitals:   10/19/14 1529  BP: 120/71  Pulse: 80  Resp: 17  Height: 5\' 8"  (1.727 m)  Weight: 258 lb (117.028 kg)   Body mass index is 39.24 kg/(m^2).  Visual Acuity Screening   Right eye Left eye Both eyes  Without correction: 20/30 20/40   With correction:      Generalized: Well developed, in no acute distress, morbidly obese Head: normocephalic and atraumatic. Oropharynx benign   Neck: Supple, no carotid bruits   Cardiac: Regular rate rhythm, no murmur   Musculoskeletal: No deformity   Neurologic Exam: Mental Status: Alert, oriented, thought content appropriate. Speech fluent without  evidence of aphasia. Able to follow 3 step commands without difficulty. Cranial Nerves: Vision with Jessica right eye remains poor since optic neuritis. Unclear disc margins. L,eft eye disc clear, full visual fields no facial asymmetry tongue and uvula in midline. V/VII-Smile symmetricVIII-grossly intact IX/X-normal gag XI-bilateral shoulder shrug XII-midline tongue extension Motor examination: shoulder right hand weakness With loss of grip strength as well asdysmetria, but no drift.  Sensory: new onset of left hand numbness in Jessica past numbness in Jessica right hand affecting all 5 fingers and Jessica palm it had been reported Jessica loss in Jessica left hand is new but Jessica [name] also has a history of right leg loss of proprioception right-sided weakness with limp and Jessica achiness is myalgia in both legs. She has describes  soreness more than cramping. Coordination finger-to-nose dysmetria was noted on Jessica right heel-to-shin on Jessica right was not able to perform.  Deep Tendon Reflexes: 2+ and symmetric throughout Plantars: upgoing on Jessica left  Cerebellar: slow finger-to-nose dysmetria on Jessica righ and left-,unable to perform rapid alternating movements and normal heel-to-shin test. Gait and station. Jessica [name] has a wider based gait, she circumduct Jessica right leg and everts. She extends her arms slightly to gain better balance - Her turns are fragmented in four steps. 54 seconds for 6 times 18 feet Walking with turns.   ASSESSMENT: Ms. Tisheena Maguire is a 33 year old Caucasian female with diagnosis of relapsing remitting multiple sclerosis since 2003. Over Jessica last 11 years her disease has progressed although it slowly and she is meanwhile developed a neurologic gait disorder. She does have remaining vision impairment.   Her MRI spine from January 2015 showed old lesions, but a progression of Jessica disease. C5 and C2 .   MRI brain 11-06-13 documented active disease with new lesions, enhancing. Left frontal.     This led to Jessica Tecfidera consideration. Until then on Copaxone. She finally decided on Plegrety .   Before Ampyra walking test- 54 seconds for 6 times walk 18 feet walking with turns. Started with medicine. Not able to take twice daily due to nausea, taking once daily. After Ampyra walking test - 50 seconds for 6 times walking 18 feet with turns. Continue medicine.  Her sleep diagnosis is insomnia - she tested negative for sleep apnea.   She has mild subjectively reported mild memory difficulties- however she is clearly depressed. Mild memory difficulty likely due to severe depression.      Vision changes, diplopia,  INO.? right eye is weaker. Needs eye exam every visit. Referral to MD>   PLAN: 1. Multiple sclerosis [name] - with exacerbation, 3 days of solu-Medrol through [facility].     Repeat MRI Brain and Cervical Spine at MRI. 2. Switch to Plegridy injections, discontinue Tecfidera since not able to tolerate full dose.   Paperwork completed and faxed to [facility]. Start Plegrity tomorrow, home visit with nurse.  3. Insomnia treated with Seroquel , 12.5 mg at night.   4. Anxiety treated with Xanax prn.   5. Depression - continue Cymbalta, 60 mg.   6. Continue Ampyra, try to work up to twice daily, take with applesauce. 7 referral to ophthalmologic exam . Bilateral ptosis and pupils seem not to constrict to direct light. Evaluation of optic neuritis, and of Goldmann fields.     10/19/2014, 4:06 PM [facility] [address], [place], [place] [address] [phone]

## 2014-12-01 ENCOUNTER — Telehealth: Payer: Self-pay | Admitting: *Deleted

## 2014-12-01 ENCOUNTER — Telehealth: Payer: Self-pay | Admitting: Neurology

## 2014-12-01 NOTE — Telephone Encounter (Signed)
Patient calling because her pen that goes with the Plegrity is broken. She called [employer] for a replacement but needs an order to get it. She states that she takes this med every other Tuesday. Please call patient if more information is needed.

## 2014-12-01 NOTE — Telephone Encounter (Signed)
[employer] is calling needing a Prior Auth for PLEGRIDY STARTER PACK 63 & 94 MCG/0.5ML SOPN. Please call and advise.

## 2014-12-01 NOTE — Telephone Encounter (Signed)
Ins has been contacted and provided all clinical info.  Request is currently pending.

## 2014-12-01 NOTE — Telephone Encounter (Signed)
I called [employer].  Spoke with [employer].  She said they do not need anything from [place].  States the patient would need to call them back and provide lot # from the medication, and they would also ask her some questions regarding how it broke.  I called the patient back.  Says she will call [employer], and will call us back if anything further is needed.

## 2014-12-02 NOTE — Telephone Encounter (Signed)
Patient was not calling about prior auth. She was calling about getting a replacement Pen.  She did not call [employer] and provide Lot number as previously requested, she called [employer].  I called called [employer] at [phone].  Spoke with Beverely Low who was not able to assist me and transferred me to [facility].  I explained the situation and Pam said they will contact the patient to schedule shipment of replacement Pen.  I called the patient back.  She is aware.

## 2014-12-02 NOTE — Telephone Encounter (Signed)
Patient called and stated she spoke with [employer] prior to calling our office and was informed they were still waiting to speak with Dr. Brett Fairy.  I relayed information to patient, according to our records all clinical information was provided.  Please call and advise.

## 2014-12-09 ENCOUNTER — Encounter: Payer: Self-pay | Admitting: Adult Health

## 2014-12-09 ENCOUNTER — Ambulatory Visit (INDEPENDENT_AMBULATORY_CARE_PROVIDER_SITE_OTHER): Payer: Medicare Other | Admitting: Adult Health

## 2014-12-09 ENCOUNTER — Other Ambulatory Visit (HOSPITAL_COMMUNITY)
Admission: RE | Admit: 2014-12-09 | Discharge: 2014-12-09 | Disposition: A | Discharge status: Home / Self Care | Payer: Medicare Other | Source: Ambulatory Visit | Attending: Adult Health | Admitting: Adult Health

## 2014-12-09 VITALS — BP 110/70 | HR 80 | Ht 68.0 in | Wt 286.0 lb

## 2014-12-09 DIAGNOSIS — Z01419 Encounter for gynecological examination (general) (routine) without abnormal findings: Secondary | ICD-10-CM | POA: Diagnosis not present

## 2014-12-09 DIAGNOSIS — Z0000 Encounter for general adult medical examination without abnormal findings: Secondary | ICD-10-CM

## 2014-12-09 DIAGNOSIS — Z1151 Encounter for screening for human papillomavirus (HPV): Secondary | ICD-10-CM | POA: Diagnosis not present

## 2014-12-09 DIAGNOSIS — Z124 Encounter for screening for malignant neoplasm of cervix: Secondary | ICD-10-CM | POA: Insufficient documentation

## 2014-12-09 DIAGNOSIS — E669 Obesity, unspecified: Secondary | ICD-10-CM | POA: Diagnosis not present

## 2014-12-09 DIAGNOSIS — R87619 Unspecified abnormal cytological findings in specimens from cervix uteri: Secondary | ICD-10-CM | POA: Diagnosis not present

## 2014-12-09 DIAGNOSIS — R5383 Other fatigue: Secondary | ICD-10-CM | POA: Diagnosis not present

## 2014-12-09 NOTE — Progress Notes (Signed)
Patient ID: IRAN KIEVIT, female   DOB: May 17, 1981, 34 y.o.   MRN: [id number] History of Present Illness: Sherleen is a 34 year old white female,new to this practice, in for well woman gyn exam and pap.Last pap about 15 years ago.She has MS and has had since 2003.   Current Medications, Allergies, Past Medical History, Past Surgical History, Family History and Social History were reviewed in [employer] record.     Review of Systems: Patient denies any headaches, hearing loss, blurred vision, shortness of breath, chest pain, abdominal pain, problems with bowel movements, urination(feels like at night, may not empty bladder,may be related to meds), or intercourse(not having sex, has been 15 years) No joint pain or mood swings,but son says she is moody at times.Complains of fatigue at times, ?related to MS and medications, and some swelling in ankles at times.Has had vision change and is seeing eye doctor soon ?related to MS, she sees Dr Lilyan Punt in [place] for this and Dr Hilma Favors is her PCP.She says she can't lose weight, has gained about 20 lbs since quit smoking.Periods are regular.    Physical Exam:BP 110/70 mmHg  Pulse 80  Ht 5\' 8"  (1.727 m)  Wt 286 lb (129.729 kg)  BMI 43.50 kg/m2  LMP 11/23/2014 General:  Well developed, well nourished, no acute distress Skin:  Warm and dry Neck:  Midline trachea, normal thyroid, good ROM, no lymphadenopathy Lungs; Clear to auscultation bilaterally Breast:  No dominant palpable mass, retraction, or nipple discharge Cardiovascular: Regular rate and rhythm Abdomen:  Soft, non tender, no hepatosplenomegaly,obese Pelvic:  External genitalia is normal in appearance, no lesions.  The vagina is normal in appearance,with good color, moisture and rugae. Urethra has no lesions or masses. The cervix is bulbous and smooth, pap with HPV performed and cervix was friable with brush.  Uterus is felt to be normal size, shape, and contour.  No  adnexal masses or tenderness noted.Bladder is non tender, no masses felt. Extremities/musculoskeletal:  No swelling or varicosities noted, no clubbing or cyanosis Psych:  No mood changes, alert and cooperative,seems happy  Impression: Well woman gyn exam with pap Obesity Fatigue    Plan: Check CBC,CMP,TSH and lipids, will talk when labs back, about diet and weight loss options  Physical in 1 year Pap ion 3 years if negative HPV Mammogram at 38

## 2014-12-09 NOTE — Patient Instructions (Signed)
Get mammogram at 38 Physical in 1 year Will talk when labs back

## 2014-12-10 ENCOUNTER — Telehealth: Payer: Self-pay | Admitting: Adult Health

## 2014-12-10 LAB — CBC
HCT: 33.9 % — ABNORMAL LOW (ref 34.0–46.6)
Hemoglobin: 10.9 g/dL — ABNORMAL LOW (ref 11.1–15.9)
MCH: 21.2 pg — ABNORMAL LOW (ref 26.6–33.0)
MCHC: 32.2 g/dL (ref 31.5–35.7)
MCV: 66 fL — AB (ref 79–97)
PLATELETS: 324 10*3/uL (ref 150–379)
RBC: 5.14 x10E6/uL (ref 3.77–5.28)
RDW: 17.7 % — AB (ref 12.3–15.4)
WBC: 7.3 10*3/uL (ref 3.4–10.8)

## 2014-12-10 LAB — COMPREHENSIVE METABOLIC PANEL
ALBUMIN: 4.1 g/dL (ref 3.5–5.5)
ALK PHOS: 61 IU/L (ref 39–117)
ALT: 16 IU/L (ref 0–32)
AST: 14 IU/L (ref 0–40)
Albumin/Globulin Ratio: 1.6 (ref 1.1–2.5)
BUN / CREAT RATIO: 16 (ref 8–20)
BUN: 10 mg/dL (ref 6–20)
Bilirubin Total: <0.2 mg/dL (ref 0.0–1.2)
CALCIUM: 8.9 mg/dL (ref 8.7–10.2)
CO2: 20 mmol/L (ref 18–29)
Chloride: 103 mmol/L (ref 97–108)
Creatinine, Ser: 0.62 mg/dL (ref 0.57–1.00)
GFR calc Af Amer: 135 mL/min/{1.73_m2} (ref >59)
GFR calc non Af Amer: 117 mL/min/{1.73_m2} (ref >59)
Globulin, Total: 2.5 g/dL (ref 1.5–4.5)
Glucose: 95 mg/dL (ref 65–99)
Potassium: 4.6 mmol/L (ref 3.5–5.2)
Sodium: 138 mmol/L (ref 134–144)
Total Protein: 6.6 g/dL (ref 6.0–8.5)

## 2014-12-10 LAB — LIPID PANEL
Chol/HDL Ratio: 2.3 ratio units (ref 0.0–4.4)
Cholesterol, Total: 110 mg/dL (ref 100–199)
HDL: 47 mg/dL (ref >39)
LDL CALC: 50 mg/dL (ref 0–99)
Triglycerides: 64 mg/dL (ref 0–149)
VLDL Cholesterol Cal: 13 mg/dL (ref 5–40)

## 2014-12-10 LAB — TSH: TSH: 3.7 u[IU]/mL (ref 0.450–4.500)

## 2014-12-10 NOTE — Telephone Encounter (Signed)
Pt aware of labs and that HBG 10.9 take iron, eat red meat and green leafy veggies like spinach

## 2014-12-11 LAB — CYTOLOGY - PAP

## 2015-01-28 ENCOUNTER — Ambulatory Visit: Payer: Medicare Other | Admitting: Neurology

## 2015-03-02 ENCOUNTER — Emergency Department (HOSPITAL_COMMUNITY)
Admission: EM | Admit: 2015-03-02 | Discharge: 2015-03-02 | Disposition: A | Discharge status: Home / Self Care | Payer: Medicare Other | Attending: Emergency Medicine | Admitting: Emergency Medicine

## 2015-03-02 ENCOUNTER — Encounter (HOSPITAL_COMMUNITY): Payer: Self-pay | Admitting: *Deleted

## 2015-03-02 ENCOUNTER — Emergency Department (HOSPITAL_COMMUNITY): Payer: Medicare Other

## 2015-03-02 DIAGNOSIS — R61 Generalized hyperhidrosis: Secondary | ICD-10-CM | POA: Insufficient documentation

## 2015-03-02 DIAGNOSIS — E669 Obesity, unspecified: Secondary | ICD-10-CM | POA: Insufficient documentation

## 2015-03-02 DIAGNOSIS — R079 Chest pain, unspecified: Secondary | ICD-10-CM | POA: Insufficient documentation

## 2015-03-02 DIAGNOSIS — R0602 Shortness of breath: Secondary | ICD-10-CM | POA: Diagnosis not present

## 2015-03-02 DIAGNOSIS — Z79899 Other long term (current) drug therapy: Secondary | ICD-10-CM | POA: Diagnosis not present

## 2015-03-02 DIAGNOSIS — Z87891 Personal history of nicotine dependence: Secondary | ICD-10-CM | POA: Diagnosis not present

## 2015-03-02 DIAGNOSIS — E876 Hypokalemia: Secondary | ICD-10-CM | POA: Insufficient documentation

## 2015-03-02 DIAGNOSIS — I10 Essential (primary) hypertension: Secondary | ICD-10-CM | POA: Insufficient documentation

## 2015-03-02 DIAGNOSIS — R110 Nausea: Secondary | ICD-10-CM | POA: Insufficient documentation

## 2015-03-02 DIAGNOSIS — G35 Multiple sclerosis: Secondary | ICD-10-CM | POA: Insufficient documentation

## 2015-03-02 DIAGNOSIS — R05 Cough: Secondary | ICD-10-CM | POA: Diagnosis not present

## 2015-03-02 DIAGNOSIS — R509 Fever, unspecified: Secondary | ICD-10-CM | POA: Insufficient documentation

## 2015-03-02 DIAGNOSIS — K219 Gastro-esophageal reflux disease without esophagitis: Secondary | ICD-10-CM | POA: Diagnosis not present

## 2015-03-02 LAB — CBC WITH DIFFERENTIAL/PLATELET
Basophils Absolute: 0 10*3/uL (ref 0.0–0.1)
Basophils Relative: 0 % (ref 0–1)
Eosinophils Absolute: 0.2 10*3/uL (ref 0.0–0.7)
Eosinophils Relative: 3 % (ref 0–5)
HCT: 31.1 % — ABNORMAL LOW (ref 36.0–46.0)
Hemoglobin: 9.8 g/dL — ABNORMAL LOW (ref 12.0–15.0)
Lymphocytes Relative: 13 % (ref 12–46)
Lymphs Abs: 0.6 10*3/uL — ABNORMAL LOW (ref 0.7–4.0)
MCH: 20.9 pg — ABNORMAL LOW (ref 26.0–34.0)
MCHC: 31.5 g/dL (ref 30.0–36.0)
MCV: 66.3 fL — ABNORMAL LOW (ref 78.0–100.0)
Monocytes Absolute: 0.4 10*3/uL (ref 0.1–1.0)
Monocytes Relative: 9 % (ref 3–12)
Neutro Abs: 3.7 10*3/uL (ref 1.7–7.7)
Neutrophils Relative %: 75 % (ref 43–77)
Platelets: 291 10*3/uL (ref 150–400)
RBC: 4.69 MIL/uL (ref 3.87–5.11)
RDW: 17.9 % — ABNORMAL HIGH (ref 11.5–15.5)
WBC: 5 10*3/uL (ref 4.0–10.5)

## 2015-03-02 LAB — BASIC METABOLIC PANEL WITH GFR
Anion gap: 9 (ref 5–15)
BUN: 12 mg/dL (ref 6–20)
CO2: 23 mmol/L (ref 22–32)
Calcium: 8.9 mg/dL (ref 8.9–10.3)
Chloride: 106 mmol/L (ref 101–111)
Creatinine, Ser: 0.62 mg/dL (ref 0.44–1.00)
GFR calc Af Amer: >60 mL/min
GFR calc non Af Amer: >60 mL/min
Glucose, Bld: 102 mg/dL — ABNORMAL HIGH (ref 70–99)
Potassium: 2.9 mmol/L — ABNORMAL LOW (ref 3.5–5.1)
Sodium: 138 mmol/L (ref 135–145)

## 2015-03-02 LAB — TROPONIN I: Troponin I: <0.03 ng/mL

## 2015-03-02 MED ORDER — ASPIRIN 81 MG PO CHEW
324.0000 mg | CHEWABLE_TABLET | Freq: Once | ORAL | Status: AC
  Administered 2015-03-02: 324 mg via ORAL
  Filled 2015-03-02: qty 4

## 2015-03-02 MED ORDER — POTASSIUM CHLORIDE CRYS ER 20 MEQ PO TBCR
60.0000 meq | EXTENDED_RELEASE_TABLET | Freq: Once | ORAL | Status: AC
  Administered 2015-03-02: 60 meq via ORAL
  Filled 2015-03-02: qty 3

## 2015-03-02 MED ORDER — KETOROLAC TROMETHAMINE 30 MG/ML IJ SOLN
15.0000 mg | Freq: Once | INTRAMUSCULAR | Status: AC
  Administered 2015-03-02: 15 mg via INTRAVENOUS
  Filled 2015-03-02: qty 1

## 2015-03-02 MED ORDER — MORPHINE SULFATE 4 MG/ML IJ SOLN
4.0000 mg | Freq: Once | INTRAMUSCULAR | Status: AC
  Administered 2015-03-02: 4 mg via INTRAVENOUS
  Filled 2015-03-02: qty 1

## 2015-03-02 NOTE — ED Provider Notes (Signed)
CSN: [id number]     Arrival date & time 03/02/15  2012 History   First MD Initiated Contact with Patient 03/02/15 2021    This chart was scribed for Virgel Manifold, MD by Terressa Koyanagi, ED Scribe. This patient was seen in room APA02/APA02 and the patient's care was started at 8:33 PM.  Chief Complaint  Patient presents with  . Chest Pain   The history is provided by the patient. No language interpreter was used.   PCP: Purvis Kilts, MD HPI Comments: [name] is a 34 y.o. female, with PMH noted below including HTN and MS, who presents to the Emergency Department complaining of atraumatic, intermittent, 8 out of 10, central chest pain with associated nausea, fever, chills, diaphoresis (states "face is burning up") and SOB onset earlier today. Pt specifies: (1) sitting up causes dizziness and (2) any exertion causes SOB.  Pt reports taking ibuprofen at home without relief. Pt further states a Hx of similar Sx, but denies seeking medical attention for the same in the past.   Pt reports a family Hx of heart problems: pt's father was 32 when he had his first heart attack and has 6-7 stent placements; pt's uncle was 2 when he had his first heart attack.   Past Medical History  Diagnosis Date  . Hypertension   . Multiple sclerosis     Dr Lonzo Cloud  . GERD (gastroesophageal reflux disease)   . Regurgitation     cardiac valve. echo 2013 for first eval GILENYA,mod MR,TR  . Numbness in right leg 02/13,12/12  . Multiple sclerosis exacerbation   . Obesity (BMI 35.0-39.9 without comorbidity)   . Other malaise and fatigue 10/20/2013   Past Surgical History  Procedure Laterality Date  . Cholecystectomy  2010  . Back surgery  2006  . Cesarean section  2000  . Esophagogastroduodenoscopy  08/05/2008    Fields-incomplete Schatzki's ring, distal esophageal erosions/esophagitis(mild), small HH, NEGATIVE h pylori  . Colonoscopy  09/14/2010    Fields-hyperplastic polyps, int hemorrhoids   . Cyst excision  2006    pilonoidal  . US echocardiography  03/19/2012    RV mildly dilated,RA mod. dilated,strial septum aneurysmal,Mod. MR & TR   Family History  Problem Relation Age of Onset  . Other Mother     lost leg, has trouble swallowing  . Heart attack Father   . Coronary artery disease Father   . Diabetes Father   . Arthritis Father     rheumatoid  . Colon cancer Paternal Uncle 64  . Colon cancer Paternal Grandmother 67  . Diabetes Paternal Grandmother   . Cancer Paternal Grandmother     breast,lung  . Cancer Maternal Grandmother 60    kind unknown  . Diabetes Maternal Grandmother   . Myasthenia gravis Maternal Grandfather   . Other Sister     tumor on ovary  . ADD / ADHD Son   . Heart attack Paternal Grandfather   . Multiple sclerosis Other     paternal great aunt   History  Substance Use Topics  . Smoking status: Former Smoker -- 1.00 packs/day for 20 years    Types: Cigarettes    Quit date: 02/15/2012  . Smokeless tobacco: Never Used  . Alcohol Use: No   OB History    Gravida Para Term Preterm AB TAB SAB Ectopic Multiple Living   1 1        1      Review of Systems  Constitutional: Positive for fever,  chills and diaphoresis.  Respiratory: Positive for cough (mild cough ongoing prior to onset of Sx. ) and shortness of breath.   Cardiovascular: Positive for chest pain.  Gastrointestinal: Positive for nausea.  Neurological: Negative for speech difficulty.  Psychiatric/Behavioral: Negative for confusion.  All other systems reviewed and are negative.  Allergies  Propoxyphene n-acetaminophen  Home Medications   Prior to Admission medications   Medication Sig Start Date End Date Taking? Authorizing Provider  ALPRAZolam Duanne Moron) 1 MG tablet Take 0.5 tablets (0.5 mg total) by mouth 2 (two) times daily. 06/17/14   Asencion Partridge Dohmeier, MD  dalfampridine 10 MG TB12 Take 1 tablet (10 mg total) by mouth 2 (two) times daily. 06/17/14   Asencion Partridge Dohmeier, MD   DULoxetine (CYMBALTA) 60 MG capsule Take 1 capsule (60 mg total) by mouth daily. 10/19/14   Carmen Dohmeier, MD  PLEGRIDY STARTER PACK 63 & 94 MCG/0.5ML SOPN Inject 1 Syringe into the skin every 14 (fourteen) days. 09/03/14   Philmore Pali, NP  QUEtiapine (SEROQUEL) 25 MG tablet Take 1 tablet (25 mg total) by mouth at bedtime. If too sedated, take 1/2 tablet Patient taking differently: Take 25 mg by mouth as needed. If too sedated, take 1/2 tablet 10/19/14   Larey Seat, MD  ranitidine (ZANTAC) 150 MG tablet Take 150 mg by mouth 2 (two) times daily.    Historical Provider, MD   Triage Vitals: BP 128/76 mmHg  Pulse 84  Temp(Src) 98.9 F (37.2 C) (Oral)  Resp 20  Ht 5\' 9"  (1.753 m)  Wt 280 lb (127.007 kg)  BMI 41.33 kg/m2  SpO2 98%  LMP 01/31/2015 (Approximate)  Physical Exam  Constitutional: She is oriented to person, place, and time. She appears well-developed and well-nourished. No distress.  HENT:  Head: Normocephalic and atraumatic.  Eyes: Conjunctivae and EOM are normal.  Neck: Neck supple.  Cardiovascular: Normal rate.   Pulmonary/Chest: Effort normal. No respiratory distress.  Musculoskeletal: Normal range of motion.  Lower extremities symmetric as compared to each other. No calf tenderness. Negative Homan's. No palpable cords.   Neurological: She is alert and oriented to person, place, and time.  Skin: Skin is warm and dry.  Psychiatric: She has a normal mood and affect. Her behavior is normal.  Nursing note and vitals reviewed.   ED Course  Procedures (including critical care time) DIAGNOSTIC STUDIES: Oxygen Saturation is 98% on RA, nl by my interpretation.    COORDINATION OF CARE: 8:36 PM-Discussed treatment plan including EKG results, meds, imaging, and labs with pt at bedside and pt agreed to plan.   Labs Review Labs Reviewed  CBC WITH DIFFERENTIAL/PLATELET - Abnormal; Notable for the following:    Hemoglobin 9.8 (*)    HCT 31.1 (*)    MCV 66.3 (*)    MCH  20.9 (*)    RDW 17.9 (*)    Lymphs Abs 0.6 (*)    All other components within normal limits  TROPONIN I  BASIC METABOLIC PANEL    Imaging Review Dg Chest 2 View  03/02/2015   CLINICAL DATA:  Acute onset of chest pain this morning.  Nausea.  EXAM: CHEST  2 VIEW  COMPARISON:  05/04/2011.  FINDINGS: The heart is mildly enlarged for PA projection. This is a new finding compared to 05/04/2011. There is no airspace disease or pleural effusion. Cholecystectomy clips are present in the right upper quadrant. Monitoring leads project over the chest.  IMPRESSION: New Mild cardiomegaly without failure.   Electronically Signed   By:  Dereck Ligas M.D.   On: 03/02/2015 21:19     EKG Interpretation None       EKG:  Rhythm: Normal sinus rhythm  Comparison: Vent. rate 83 BPM PR interval 151 ms QRS duration 106 ms QT/QTc 394/463 ms ST segments:nonspecific ST changes Comparison: Does not appear significantly changed from EKG 05/09/2013   MDM   Final diagnoses:  Chest pain   34 year old female with chest pain. Atypical for ACS. Normal troponin after constant symptoms all day today. Doubt PE. Not tachycardic. Normal oxygen saturations. No clinical signs or symptoms of DVT. Chest x-ray without acute process. EKG is not acutely change from prior. It has been determined that no acute conditions requiring further emergency intervention are present at this time. The patient has been advised of the diagnosis and plan. I reviewed any labs and imaging including any potential incidental findings. We have discussed signs and symptoms that warrant return to the ED and they are listed in the discharge instructions.   I personally preformed the services scribed in my presence. The recorded information has been reviewed is accurate. Virgel Manifold, MD.     Virgel Manifold, MD 03/05/15 [phone]

## 2015-03-02 NOTE — ED Notes (Signed)
Pt. Reports chest pain starting this morning. Pt. Denies anything making the pain better or worse. Pt. Reports nausea. Pt. Reports pain feels "like a child is sitting on my chest"

## 2015-03-02 NOTE — Discharge Instructions (Signed)
Chest Pain (Nonspecific) °It is often hard to give a specific diagnosis for the cause of chest pain. There is always a chance that your pain could be related to something serious, such as a heart attack or a blood clot in the lungs. You need to follow up with your health care provider for further evaluation. °CAUSES  °· Heartburn. °· Pneumonia or bronchitis. °· Anxiety or stress. °· Inflammation around your heart (pericarditis) or lung (pleuritis or pleurisy). °· A blood clot in the lung. °· A collapsed lung (pneumothorax). It can develop suddenly on its own (spontaneous pneumothorax) or from trauma to the chest. °· Shingles infection (herpes zoster virus). °The chest wall is composed of bones, muscles, and cartilage. Any of these can be the source of the pain. °· The bones can be bruised by injury. °· The muscles or cartilage can be strained by coughing or overwork. °· The cartilage can be affected by inflammation and become sore (costochondritis). °DIAGNOSIS  °Lab tests or other studies may be needed to find the cause of your pain. Your health care provider may have you take a test called an ambulatory electrocardiogram (ECG). An ECG records your heartbeat patterns over a 24-hour period. You may also have other tests, such as: °· Transthoracic echocardiogram (TTE). During echocardiography, sound waves are used to evaluate how blood flows through your heart. °· Transesophageal echocardiogram (TEE). °· Cardiac monitoring. This allows your health care provider to monitor your heart rate and rhythm in real time. °· Holter monitor. This is a portable device that records your heartbeat and can help diagnose heart arrhythmias. It allows your health care provider to track your heart activity for several days, if needed. °· Stress tests by exercise or by giving medicine that makes the heart beat faster. °TREATMENT  °· Treatment depends on what may be causing your chest pain. Treatment may include: °· Acid blockers for  heartburn. °· Anti-inflammatory medicine. °· Pain medicine for inflammatory conditions. °· Antibiotics if an infection is present. °· You may be advised to change lifestyle habits. This includes stopping smoking and avoiding alcohol, caffeine, and chocolate. °· You may be advised to keep your head raised (elevated) when sleeping. This reduces the chance of acid going backward from your stomach into your esophagus. °Most of the time, nonspecific chest pain will improve within 2-3 days with rest and mild pain medicine.  °HOME CARE INSTRUCTIONS  °· If antibiotics were prescribed, take them as directed. Finish them even if you start to feel better. °· For the next few days, avoid physical activities that bring on chest pain. Continue physical activities as directed. °· Do not use any tobacco products, including cigarettes, chewing tobacco, or electronic cigarettes. °· Avoid drinking alcohol. °· Only take medicine as directed by your health care provider. °· Follow your health care provider's suggestions for further testing if your chest pain does not go away. °· Keep any follow-up appointments you made. If you do not go to an appointment, you could develop lasting (chronic) problems with pain. If there is any problem keeping an appointment, call to reschedule. °SEEK MEDICAL CARE IF:  °· Your chest pain does not go away, even after treatment. °· You have a rash with blisters on your chest. °· You have a fever. °SEEK IMMEDIATE MEDICAL CARE IF:  °· You have increased chest pain or pain that spreads to your arm, neck, jaw, back, or abdomen. °· You have shortness of breath. °· You have an increasing cough, or you cough   up blood.  You have severe back or abdominal pain.  You feel nauseous or vomit.  You have severe weakness.  You faint.  You have chills. This is an emergency. Do not wait to see if the pain will go away. Get medical help at once. Call your local emergency services (911 in [place] Do not drive  yourself to the hospital. MAKE SURE YOU:   Understand these instructions.  Will watch your condition.  Will get help right away if you are not doing well or get worse. Document Released: 07/19/2005 Document Revised: 10/14/2013 Document Reviewed: 05/14/2008 [facility] Patient Information 2015 [place], [place]. This information is not intended to replace advice given to you by your health care provider. Make sure you discuss any questions you have with your health care provider.  Hypokalemia Hypokalemia means that the amount of potassium in the blood is lower than normal.Potassium is a chemical, called an electrolyte, that helps regulate the amount of fluid in the body. It also stimulates muscle contraction and helps nerves function properly.Most of the body's potassium is inside of cells, and only a very small amount is in the blood. Because the amount in the blood is so small, minor changes can be life-threatening. CAUSES  Antibiotics.  Diarrhea or vomiting.  Using laxatives too much, which can cause diarrhea.  Chronic kidney disease.  Water pills (diuretics).  Eating disorders (bulimia).  Low magnesium level.  Sweating a lot. SIGNS AND SYMPTOMS  Weakness.  Constipation.  Fatigue.  Muscle cramps.  Mental confusion.  Skipped heartbeats or irregular heartbeat (palpitations).  Tingling or numbness. DIAGNOSIS  Your health care provider can diagnose hypokalemia with blood tests. In addition to checking your potassium level, your health care provider may also check other lab tests. TREATMENT Hypokalemia can be treated with potassium supplements taken by mouth or adjustments in your current medicines. If your potassium level is very low, you may need to get potassium through a vein (IV) and be monitored in the hospital. A diet high in potassium is also helpful. Foods high in potassium are:  Nuts, such as peanuts and pistachios.  Seeds, such as sunflower seeds and pumpkin  seeds.  Peas, lentils, and lima beans.  Whole grain and bran cereals and breads.  Fresh fruit and vegetables, such as apricots, avocado, bananas, cantaloupe, kiwi, oranges, tomatoes, asparagus, and potatoes.  Orange and tomato juices.  Red meats.  Fruit yogurt. HOME CARE INSTRUCTIONS  Take all medicines as prescribed by your health care provider.  Maintain a healthy diet by including nutritious food, such as fruits, vegetables, nuts, whole grains, and lean meats.  If you are taking a laxative, be sure to follow the directions on the label. SEEK MEDICAL CARE IF:  Your weakness gets worse.  You feel your heart pounding or racing.  You are vomiting or having diarrhea.  You are diabetic and having trouble keeping your blood glucose in the normal range. SEEK IMMEDIATE MEDICAL CARE IF:  You have chest pain, shortness of breath, or dizziness.  You are vomiting or having diarrhea for more than 2 days.  You faint. MAKE SURE YOU:   Understand these instructions.  Will watch your condition.  Will get help right away if you are not doing well or get worse. Document Released: 10/09/2005 Document Revised: 07/30/2013 Document Reviewed: 04/11/2013 [facility] Patient Information 2015 [place], [place]. This information is not intended to replace advice given to you by your health care provider. Make sure you discuss any questions you have with your  health care provider. ° °

## 2015-03-02 NOTE — ED Notes (Signed)
Chest painm,"all day"

## 2015-03-30 ENCOUNTER — Ambulatory Visit (INDEPENDENT_AMBULATORY_CARE_PROVIDER_SITE_OTHER): Payer: Medicare Other | Admitting: Neurology

## 2015-03-30 ENCOUNTER — Encounter: Payer: Self-pay | Admitting: Neurology

## 2015-03-30 VITALS — BP 122/80 | HR 86 | Resp 20 | Ht 67.72 in | Wt 282.0 lb

## 2015-03-30 DIAGNOSIS — G35 Multiple sclerosis: Secondary | ICD-10-CM | POA: Diagnosis not present

## 2015-03-30 DIAGNOSIS — Z79899 Other long term (current) drug therapy: Secondary | ICD-10-CM

## 2015-03-30 DIAGNOSIS — H81319 Aural vertigo, unspecified ear: Secondary | ICD-10-CM | POA: Insufficient documentation

## 2015-03-30 DIAGNOSIS — L568 Other specified acute skin changes due to ultraviolet radiation: Secondary | ICD-10-CM | POA: Diagnosis not present

## 2015-03-30 DIAGNOSIS — H81312 Aural vertigo, left ear: Secondary | ICD-10-CM | POA: Diagnosis not present

## 2015-03-30 DIAGNOSIS — G35D Multiple sclerosis, unspecified: Secondary | ICD-10-CM

## 2015-03-30 MED ORDER — ALPRAZOLAM 1 MG PO TABS: 0.5000 mg | ORAL_TABLET | Freq: Two times a day (BID) | ORAL | Status: DC

## 2015-03-30 MED ORDER — DALFAMPRIDINE ER 10 MG PO TB12: 10.0000 mg | ORAL_TABLET | Freq: Two times a day (BID) | ORAL | Status: DC

## 2015-03-30 MED ORDER — PEGINTERFERON BETA-1A 125 MCG/0.5ML ~~LOC~~ SOPN: 125.0000 ug | PEN_INJECTOR | SUBCUTANEOUS | Status: DC

## 2015-03-30 NOTE — Progress Notes (Signed)
PATIENT: [name] DOB: November 27, 1980  REASON FOR VISIT: routine follow up for MS HISTORY FROM: patient  HISTORY OF PRESENT ILLNESS: Ms. [name] is a 34 year-old caucasian female with history of RRMS.  She returns today for sooner followup, due to diplopia and vertigo. Vertigo worse after mowing the lawn. Today still present. I suspect she is dehydrated.  03-30-15, she continued Plegredy pen and is doing well on it, q 14 days.  Patient was in the past unwilling to try tysabri. She has tried seroquel , which helped her to get to sleep at night and she is avoiding daytime naps. She checks on her grand parents , who are in poor health.   On 08/20/14, she called reporting double vision. This is how her exacerbations have started in the past. Dr. Brett Fairy gave 20 mg prednisone tabs on taper for the next month.  She stated that the prednisone pack usually helps but not this time. She is almost finished with this.   No pain, both eyes she states are affected, R side more  (states that she can not move the eye all the way over horizontally). The diplopia is giving her vertigo as well.  07/29/14: Started Ampyra, has been only able to tolerate once daily due to nausea. Feels like it has improved her ability to walk.  Her father who is with her states that he has seen a definite improvement in her mobility - but she still has pain in the right thigh. Last visit walking test showed: 54 seconds for 6 times 18 feet Walking with turns. Still complains of memory difficulties.  Mood is some better. Nausea on Tecfidera, tolerated only 60 mg Bid with applesauce.  Patient had a brain MRI 11-06-13 with acute enhancing lesions, left frontal.  Here today to get a 60 mg bid prescription of tecfidera and to discuss Ampyra.   Dr. Benn Moulder mentioned her high degree of sleepiness. Sent for SPLIT study. Patient had a normal PSG, no significant AHi, no PLMs, no RDI and only spontaneous arousals.anxiety/ depression  in the diff. diagnosis. She has considered taking stimulant medications but couldn't use these with her heart history. Her Use of Seroquel allowed for deep sleep at a 25 mg daily dose and she feels better.   Until than she still took naps 2 hours in the morning after her son leaves for school, slept from 23.00 hours to 06.30. Epworth sleepiness score no endorsed at 6 points and her fatigue severity score at 57 pints per liter very high. [name] in her last visit performed a [place] test , on which the patient scored 23/30 points indicating cognitive impairment or depression related to cognitive decline.  Generate more than 7 bruits beginning was a certain left, and all he recalled 2/5 recall ports, test was repeated, the patient scored 26/30.today        ALLERGIES: Allergies  Allergen Reactions  . Propoxyphene N-Acetaminophen Rash    HOME MEDICATIONS: Outpatient Prescriptions Prior to Visit  Medication Sig Dispense Refill  . ALPRAZolam (XANAX) 1 MG tablet Take 0.5 tablets (0.5 mg total) by mouth 2 (two) times daily. 60 tablet 5  . Cimetidine (EQ ACID REDUCER PO) Take 1-2 tablets by mouth daily as needed (FOR ACID REFLUX-RELIEF).    Marland Kitchen dalfampridine 10 MG TB12 Take 1 tablet (10 mg total) by mouth 2 (two) times daily. 60 tablet 5  . DULoxetine (CYMBALTA) 60 MG capsule Take 1 capsule (60 mg total) by mouth daily. [place]  capsule 5  . ibuprofen (ADVIL,MOTRIN) 200 MG tablet Take 600 mg by mouth every 6 (six) hours as needed for mild pain or moderate pain.    . Peginterferon Beta-1a 125 MCG/0.5ML SOPN Inject 125 mcg into the skin every 14 (fourteen) days.    Marland Kitchen QUEtiapine (SEROQUEL) 25 MG tablet Take 1 tablet (25 mg total) by mouth at bedtime. If too sedated, take 1/2 tablet (Patient not taking: Reported on 03/30/2015) 30 tablet 5  . PLEGRIDY STARTER PACK 63 & 94 MCG/0.5ML SOPN Inject 1 Syringe into the skin every 14 (fourteen) days. (Patient not taking: Reported on 03/02/2015)     No facility-administered  medications prior to visit.    PHYSICAL EXAM Filed Vitals:   03/30/15 1520  BP: 122/80  Pulse: 86  Resp: 20  Height: 5' 7.72" (1.72 m)  Weight: 282 lb (127.914 kg)   Body mass index is 43.24 kg/(m^2). No exam data present Generalized: Well developed, in no acute distress, morbidly obese Head: normocephalic and atraumatic. Oropharynx benign   Neck: Supple, no carotid bruits   Cardiac: Regular rate rhythm, no murmur   Musculoskeletal: No deformity   Neurologic Exam: Mental Status: Alert, oriented, thought content appropriate. Speech fluent without evidence of aphasia. Able to follow 3 step commands without difficulty. Cranial Nerves: Evidence of I&O was restricted pupillary sensitivity, photosensitivity is reported causing headaches. Nystagmus with gaze to the right, the patient reports was closed eyes sensation of the room spinning clock wise around her. Motor examination: shoulder right hand weakness With loss of grip strength as well asdysmetria, but no drift.  Sensory: new onset of left hand numbness in the past numbness in the right hand affecting all 5 fingers and the palm it had been reported the loss in the left hand is new but the patient also has a history of right leg loss of proprioception right-sided weakness with limp and the achiness is myalgia in both legs. She has describes soreness more than cramping. Coordination finger-to-nose dysmetria was noted on the right heel-to-shin on the right was not able to perform.  Deep Tendon Reflexes: 2+ and symmetric throughout Plantars: upgoing on the left  Cerebellar: slow finger-to-nose dysmetria on the righ and left-,unable to perform rapid alternating movements and normal heel-to-shin test. Gait and station. The patient has a wider based gait, she circumduct the right leg and everts. She extends her arms slightly to gain better balance - Her turns are fragmented in four steps. 54 seconds for 6 times 18 feet Walking with  turns.   ASSESSMENT: Ms. [name] is a 34 year old Caucasian female with diagnosis of relapsing remitting multiple sclerosis since 2003. Over the last 11 years her disease has progressed although it slowly and she is meanwhile developed a neurologic gait disorder. She does have remaining vision impairment.   Her MRI spine from January 2015 showed old lesions, but overall a progression of the disease. C5 and C2 .  MRI brain 11-06-13 documented active disease with new lesions, enhancing. Left frontal.   This led to the Tecfidera consideration. Until then on Copaxone. She finally decided on Plegrety .  Before Ampyra walking test- 54 seconds for 6 times walk 18 feet walking with turns. Started with medicine. Not able to take twice daily due to nausea, taking once daily. After Ampyra walking test - 50 seconds for 6 times walking 18 feet with turns. Continue medicine.  Her sleep diagnosis is insomnia - she tested negative for sleep apnea.   She has  mild subjectively reported mild memory difficulties- however she is clearly depressed. Mild memory difficulty likely due to severe depression.     Vertigo - dehydration after yard work, after [employer]- dehydration?   PLAN:  1. Dehydration,  counseled to drink fluids- water, water , water.  2. Continue with  Plegridy injections,Paperwork completed and faxed to [facility].  3. Insomnia treated with Seroquel ,  Now only prn . 12.5 mg at night.   4. Anxiety treated with Xanax prn.   5. Depression - continue Cymbalta, 60 mg.   6. Continue Ampyra, try to work up to twice daily, take with applesauce. 7. referral to ophthalmologic exam . Bilateral ptosis and pupils seem not to constrict to direct light. Evaluation of optic neuritis, and of [place] fields.     03/30/2015, 3:34 PM [facility] [address], [place],  [address] [phone]

## 2015-03-31 LAB — CBC WITH DIFFERENTIAL/PLATELET
BASOS ABS: 0 10*3/uL (ref 0.0–0.2)
Basos: 0 %
EOS (ABSOLUTE): 0.6 10*3/uL — AB (ref 0.0–0.4)
Eos: 6 %
HEMATOCRIT: 34 % (ref 34.0–46.6)
Hemoglobin: 10.7 g/dL — ABNORMAL LOW (ref 11.1–15.9)
IMMATURE GRANULOCYTES: 0 %
Immature Grans (Abs): 0 10*3/uL (ref 0.0–0.1)
LYMPHS ABS: 1.7 10*3/uL (ref 0.7–3.1)
Lymphs: 18 %
MCH: 20.9 pg — ABNORMAL LOW (ref 26.6–33.0)
MCHC: 31.5 g/dL (ref 31.5–35.7)
MCV: 66 fL — ABNORMAL LOW (ref 79–97)
MONOCYTES: 8 %
MONOS ABS: 0.8 10*3/uL (ref 0.1–0.9)
NEUTROS PCT: 68 %
Neutrophils Absolute: 6.6 10*3/uL (ref 1.4–7.0)
Platelets: 398 10*3/uL — ABNORMAL HIGH (ref 150–379)
RBC: 5.13 x10E6/uL (ref 3.77–5.28)
RDW: 18.8 % — AB (ref 12.3–15.4)
WBC: 9.7 10*3/uL (ref 3.4–10.8)

## 2015-03-31 LAB — COMPREHENSIVE METABOLIC PANEL
ALBUMIN: 4.1 g/dL (ref 3.5–5.5)
ALT: 17 IU/L (ref 0–32)
AST: 14 IU/L (ref 0–40)
Albumin/Globulin Ratio: 1.6 (ref 1.1–2.5)
Alkaline Phosphatase: 58 IU/L (ref 39–117)
BUN / CREAT RATIO: 15 (ref 8–20)
BUN: 10 mg/dL (ref 6–20)
Bilirubin Total: <0.2 mg/dL (ref 0.0–1.2)
CHLORIDE: 104 mmol/L (ref 97–108)
CO2: 22 mmol/L (ref 18–29)
Calcium: 9.8 mg/dL (ref 8.7–10.2)
Creatinine, Ser: 0.68 mg/dL (ref 0.57–1.00)
GFR calc Af Amer: 131 mL/min/{1.73_m2} (ref >59)
GFR calc non Af Amer: 114 mL/min/{1.73_m2} (ref >59)
GLUCOSE: 99 mg/dL (ref 65–99)
Globulin, Total: 2.6 g/dL (ref 1.5–4.5)
Potassium: 4.3 mmol/L (ref 3.5–5.2)
Sodium: 139 mmol/L (ref 134–144)
Total Protein: 6.7 g/dL (ref 6.0–8.5)

## 2015-04-05 ENCOUNTER — Telehealth: Payer: Self-pay

## 2015-04-05 NOTE — Telephone Encounter (Signed)
-----   Message from Larey Seat, MD sent at 04/01/2015  5:06 PM EDT ----- Normal electrolyte , potassium back to normal. CBC documented anemia, eosinophilia. We need to get her to see a PCP, this is not all neurology- needs internist

## 2015-04-05 NOTE — Telephone Encounter (Signed)
Relayed lab results to pt. I asked if she has a PCP and she said that she called around to a couple in [place] but none would accept her insurance. Will check with Dr. Brett Fairy to see who she recommends for a PCP and call pt back.

## 2015-04-07 ENCOUNTER — Other Ambulatory Visit: Payer: Self-pay

## 2015-04-07 DIAGNOSIS — G35 Multiple sclerosis: Secondary | ICD-10-CM

## 2015-04-07 MED ORDER — DULOXETINE HCL 60 MG PO CPEP: 60.0000 mg | ORAL_CAPSULE | Freq: Every day | ORAL | Status: DC

## 2015-04-07 NOTE — Telephone Encounter (Signed)
Called to give pt the number for [facility] to help her establish a PCP. Also gave her [employer] and [place] number. She requested a refill on cymbalta and that was sent in to her pharmacy as well today. Pt verbalized understanding.

## 2015-07-19 ENCOUNTER — Telehealth: Payer: Self-pay | Admitting: Neurology

## 2015-07-19 NOTE — Telephone Encounter (Signed)
Pt is experiencing significant increase in her vertigo. Pt assures me that she is well hydrated but is having nausea along with her vertigo. Pt states that she was told by Dr. Brett Fairy that xanax helps with vertigo, so she has been taking it BID, but it is not helping.  Pt is requesting medication to help with her vertigo. Pt is also wondering if she need to have another MRI repeated before her appt in 09/2015 to check MS progression. I advised pt that I would send this message to Dr. Brett Fairy and I would call her back with recommendations.

## 2015-07-19 NOTE — Telephone Encounter (Signed)
Please make Rv appointment with NP. 30 minutes. Vertigo follow up. This patient has MS. CD

## 2015-07-19 NOTE — Telephone Encounter (Signed)
Patient called to advise "vertigo has come back and is worse, nauseated". Would like to know if Dr. Brett Fairy can give her something for the vertigo? Is still taking Plegridy pen injection. Please call patient [phone].

## 2015-07-19 NOTE — Telephone Encounter (Signed)
Called and spoke to patient relayed [name] - NP had a opening . Patient was fine to come in a see Megan.

## 2015-07-19 NOTE — Telephone Encounter (Signed)
Called pt to get her scheduled with an NP. No answer, left a message asking her to call back.

## 2015-07-20 ENCOUNTER — Encounter: Payer: Self-pay | Admitting: Adult Health

## 2015-07-20 ENCOUNTER — Ambulatory Visit (INDEPENDENT_AMBULATORY_CARE_PROVIDER_SITE_OTHER): Payer: Medicare Other | Admitting: Adult Health

## 2015-07-20 VITALS — BP 120/78 | HR 65 | Ht 68.0 in | Wt 275.0 lb

## 2015-07-20 DIAGNOSIS — G35 Multiple sclerosis: Secondary | ICD-10-CM | POA: Diagnosis not present

## 2015-07-20 DIAGNOSIS — R42 Dizziness and giddiness: Secondary | ICD-10-CM

## 2015-07-20 NOTE — Progress Notes (Signed)
I agree with the assessment and plan as directed by NP .The patient is known to me .   DOHMEIER,CARMEN, MD  

## 2015-07-20 NOTE — Progress Notes (Signed)
PATIENT: [name] DOB: 08/05/1981  REASON FOR VISIT: follow up-multiple sclerosis, diplopia, vertigo HISTORY FROM: patient  HISTORY OF PRESENT ILLNESS:  [name] is a 34 year old female with a history of multiple sclerosis, diplopia and vertigo. She returns today for follow-up. The patient states that her diplopia has resolved however she continues to have vertigo. She states in the last 2 weeks it has gotten more severe. She describes the vertigo as if she is stepping off a merry-go-round. She states that the room is spinning. She typically notices it when she turns her head to the right quickly. She states she can always tell when it is about to occur she'll have a clammy feeling the room will start spinning and if she tries to get up to ambulate she would fall from the dizziness. She also reports some nausea associated with the dizziness. She states she typically can lay down and the dizziness improves. She continues to take plegredy for her MS. Denies any new numbness or weakness. Denies any changes with her vision. Denies any troubles with the bowels or bladder although she states sometimes during the night she has issues with retention initially but then is able to urinate. She returns today for an evaluation.  HISTORY  [facility]): [name] is a 34 year-old caucasian female with history of RRMS.  She returns today for sooner followup, due to diplopia and vertigo. Vertigo worse after mowing the lawn. Today still present. I suspect she is dehydrated.  03-30-15, she continued Plegredy pen and is doing well on it, q 14 days.  Patient was in the past unwilling to try tysabri. She has tried seroquel , which helped her to get to sleep at night and she is avoiding daytime naps. She checks on her grand parents , who are in poor health.   On 08/20/14, she called reporting double vision. This is how her exacerbations have started in the past. Dr. Brett Fairy gave 20 mg prednisone  tabs on taper for the next month. She stated that the prednisone pack usually helps but not this time.She is almost finished with this.  No pain, both eyes she states are affected, R side more (states that she can not move the eye all the way over horizontally).The diplopia is giving her vertigo as well.  07/29/14: Started Ampyra, has been only able to tolerate once daily due to nausea. Feels like it has improved her ability to walk. Her father who is with her states that he has seen a definite improvement in her mobility - but she still has pain in the right thigh. Last visit walking test showed: 54 seconds for 6 times 18 feet Walking with turns. Still complains of memory difficulties. Mood is some better. Nausea on Tecfidera, tolerated only 60 mg Bid with applesauce. Patient had a brain MRI 11-06-13 with acute enhancing lesions, left frontal. Here today to get a 60 mg bid prescription of tecfidera and to discuss Ampyra.   Dr. Benn Moulder mentioned her high degree of sleepiness. Sent for SPLIT study. Patient had a normal PSG, no significant AHi, no PLMs, no RDI and only spontaneous arousals.anxiety/ depression in the diff. diagnosis. She has considered taking stimulant medications but couldn't use these with her heart history. Her Use of Seroquel allowed for deep sleep at a 25 mg daily dose and she feels better.  Until than she still took naps 2 hours in the morning after her son leaves for school, slept from 23.00 hours to 06.30. Epworth  sleepiness score no endorsed at 6 points and her fatigue severity score at 57 pints per liter very high. Mrs. Shropshire in her last visit performed a [place] test , on which the patient scored 23/30 points indicating cognitive impairment or depression related to cognitive decline. Generate more than 7 bruits beginning was a certain left, and all he recalled 2/5 recall ports, test was repeated, the patient scored 26/30.today    REVIEW OF SYSTEMS: Out of a complete 14  system review of symptoms, the patient complains only of the following symptoms, and all other reviewed systems are negative.  Light sensitivity, double vision, blurred vision, back pain, dizziness  ALLERGIES: Allergies  Allergen Reactions  . Propoxyphene N-Acetaminophen Rash    HOME MEDICATIONS: Outpatient Prescriptions Prior to Visit  Medication Sig Dispense Refill  . ALPRAZolam (XANAX) 1 MG tablet Take 0.5 tablets (0.5 mg total) by mouth 2 (two) times daily. 60 tablet 5  . Cimetidine (EQ ACID REDUCER PO) Take 1-2 tablets by mouth daily as needed (FOR ACID REFLUX-RELIEF).    Marland Kitchen dalfampridine 10 MG TB12 Take 1 tablet (10 mg total) by mouth 2 (two) times daily. 60 tablet 5  . DULoxetine (CYMBALTA) 60 MG capsule Take 1 capsule (60 mg total) by mouth daily. 30 capsule 5  . ibuprofen (ADVIL,MOTRIN) 200 MG tablet Take 600 mg by mouth every 6 (six) hours as needed for mild pain or moderate pain.    . Peginterferon Beta-1a 125 MCG/0.5ML SOPN Inject 125 mcg into the skin every 14 (fourteen) days. 6 pen 3  . QUEtiapine (SEROQUEL) 25 MG tablet Take 1 tablet (25 mg total) by mouth at bedtime. If too sedated, take 1/2 tablet (Patient not taking: Reported on 03/30/2015) 30 tablet 5   No facility-administered medications prior to visit.    PAST MEDICAL HISTORY: Past Medical History  Diagnosis Date  . Hypertension   . Multiple sclerosis     Dr Lonzo Cloud  . GERD (gastroesophageal reflux disease)   . Regurgitation     cardiac valve. echo 2013 for first eval GILENYA,mod MR,TR  . Numbness in right leg 02/13,12/12  . Multiple sclerosis exacerbation   . Obesity (BMI 35.0-39.9 without comorbidity)   . Other malaise and fatigue 10/20/2013    PAST SURGICAL HISTORY: Past Surgical History  Procedure Laterality Date  . Cholecystectomy  2010  . Back surgery  2006  . Cesarean section  2000  . Esophagogastroduodenoscopy  08/05/2008    Fields-incomplete Schatzki's ring, distal esophageal  erosions/esophagitis(mild), small HH, NEGATIVE h pylori  . Colonoscopy  09/14/2010    Fields-hyperplastic polyps, int hemorrhoids  . Cyst excision  2006    pilonoidal  . US echocardiography  03/19/2012    RV mildly dilated,RA mod. dilated,strial septum aneurysmal,Mod. MR & TR    FAMILY HISTORY: Family History  Problem Relation Age of Onset  . Other Mother     lost leg, has trouble swallowing  . Heart attack Father   . Coronary artery disease Father   . Diabetes Father   . Arthritis Father     rheumatoid  . Colon cancer Paternal Uncle 15  . Colon cancer Paternal Grandmother 25  . Diabetes Paternal Grandmother   . Cancer Paternal Grandmother     breast,lung  . Cancer Maternal Grandmother 37    kind unknown  . Diabetes Maternal Grandmother   . Myasthenia gravis Maternal Grandfather   . Other Sister     tumor on ovary  . ADD / ADHD Son   .  Heart attack Paternal Grandfather   . Multiple sclerosis Other     paternal great aunt    SOCIAL HISTORY: Social History   Social History  . Marital Status: Married    Spouse Name: Divorced   . Number of Children: 1  . Years of Education: college   Occupational History  . disabled    Social History Main Topics  . Smoking status: Former Smoker -- 1.00 packs/day for 20 years    Types: Cigarettes    Quit date: 02/15/2012  . Smokeless tobacco: Never Used  . Alcohol Use: No  . Drug Use: No  . Sexual Activity: No   Other Topics Concern  . Not on file   Social History Narrative   Patient is divorced and lives at home and he son lives with her.   Patient has one child.   Patient is disabled.   Patient has some college education.   Patient is right-handed.   Patient drinks 3 servings of caffeine daily            PHYSICAL EXAM  Filed Vitals:   07/20/15 1100  BP: 120/78  Pulse: 65  Height: 5\' 8"  (1.727 m)  Weight: 275 lb (124.739 kg)   Body mass index is 41.82 kg/(m^2).  Generalized: Well developed, in no acute  distress   Neurological examination  Mentation: Alert oriented to time, place, history taking. Follows all commands speech and language fluent Cranial nerve II-XII: Pupils were equal round reactive to light. Extraocular movements were full, visual field were full on confrontational test. Multiple in the nystagmus noted. Facial sensation and strength were normal. Uvula tongue midline. Head turning and shoulder shrug  were normal and symmetric. Motor: The motor testing reveals 5 over 5 strength of all 4 extremities. Good symmetric motor tone is noted throughout. Dix Hallpike maneuver positive Sensory: Sensory testing is intact to soft touch on all 4 extremities. No evidence of extinction is noted.  Coordination: Cerebellar testing reveals good finger-nose-finger and heel-to-shin bilaterally.  Gait and station: Gait is normal. Tandem gait is normal. Romberg is negative. No drift is seen.  Reflexes: Deep tendon reflexes are symmetric and normal bilaterally.   DIAGNOSTIC DATA (LABS, IMAGING, TESTING) - I reviewed patient records, labs, notes, testing and imaging myself where available.  Lab Results  Component Value Date   WBC 9.7 03/30/2015   HGB 9.8* 03/02/2015   HCT 34.0 03/30/2015   MCV 66.3* 03/02/2015   PLT 291 03/02/2015      Component Value Date/Time   NA 139 03/30/2015 1625   NA 138 03/02/2015 2100   K 4.3 03/30/2015 1625   K 4.2 02/28/2012 1113   CL 104 03/30/2015 1625   CL 107 02/28/2012 1113   CO2 22 03/30/2015 1625   CO2 23 02/28/2012 1113   GLUCOSE 99 03/30/2015 1625   GLUCOSE 102* 03/02/2015 2100   BUN 10 03/30/2015 1625   BUN 12 03/02/2015 2100   CREATININE 0.68 03/30/2015 1625   CREATININE 0.73 02/28/2012 1113   CALCIUM 9.8 03/30/2015 1625   CALCIUM 9.5 02/28/2012 1113   PROT 6.7 03/30/2015 1625   PROT 7.3 01/14/2014 2004   PROT 6.4 02/28/2012 1113   ALBUMIN 3.6 01/14/2014 2004   AST 14 03/30/2015 1625   AST 14 02/28/2012 1113   ALT 17 03/30/2015 1625    ALKPHOS 58 03/30/2015 1625   ALKPHOS 49 02/28/2012 1113   BILITOT <0.2 03/30/2015 1625   BILITOT <0.2* 01/14/2014 2004   GFRNONAA 114 03/30/2015  [place] 03/30/2015 1625   Lab Results  Component Value Date   CHOL 110 12/09/2014   HDL 47 12/09/2014   LDLCALC 50 12/09/2014   TRIG 64 12/09/2014   CHOLHDL 2.3 12/09/2014   Lab Results  Component Value Date   TSH 3.700 12/09/2014      ASSESSMENT AND PLAN 34 y.o. year old female  has a past medical history of Hypertension; Multiple sclerosis; GERD (gastroesophageal reflux disease); Regurgitation; Numbness in right leg (02/13,12/12); Multiple sclerosis exacerbation; Obesity (BMI 35.0-39.9 without comorbidity); and Other malaise and fatigue (10/20/2013). here with :  1. Dizziness 2. Multiple sclerosis  The patient's description of her dizziness is consistent with benign positional vertigo. I will refer the patient for vestibular rehabilitation. The patient's last MRI of the brain was in November 2015. We will recheck an MRI of the brain and cervical spine to look for any progression of her MS. Patient is amenable to this plan. I have advised the patient that if her symptoms worsen or she develops new symptoms she should let us know. She will follow-up in December with Dr. Mechele Claude, MSN, NP-C 07/20/2015, 11:13 AM [facility] [address] [place], [place] [address] [phone]

## 2015-07-20 NOTE — Patient Instructions (Signed)
Referral sent for vestibular rehab for dizziness Referral for MRI brain  If your symptoms worsen or you develop new symptoms please let us know.

## 2015-07-26 DIAGNOSIS — Z0271 Encounter for disability determination: Secondary | ICD-10-CM

## 2015-07-27 ENCOUNTER — Telehealth: Payer: Self-pay | Admitting: Adult Health

## 2015-07-27 NOTE — Telephone Encounter (Signed)
Patient called, saw [name] last Tuesday. [name] was going to set up therapy for patient. Advised patient to call back on Friday 07/23/15 if she had not heard from anyone. Patient forgot to call Friday but did want to let [name] know that she still hasn't heard from anyone.

## 2015-07-27 NOTE — Telephone Encounter (Signed)
Returned call. No answer.  

## 2015-07-27 NOTE — Telephone Encounter (Signed)
Spoke to patient. Advised referral was sent to [facility]. Gave phone number. [phone]

## 2015-07-29 ENCOUNTER — Ambulatory Visit: Payer: Medicare Other | Attending: Adult Health | Admitting: Physical Therapy

## 2015-07-29 DIAGNOSIS — R42 Dizziness and giddiness: Secondary | ICD-10-CM | POA: Diagnosis not present

## 2015-07-29 DIAGNOSIS — R269 Unspecified abnormalities of gait and mobility: Secondary | ICD-10-CM | POA: Diagnosis not present

## 2015-08-01 ENCOUNTER — Encounter: Payer: Self-pay | Admitting: Physical Therapy

## 2015-08-01 NOTE — Patient Instructions (Signed)
Gaze Stabilization: Standing Feet Apart    Feet shoulder width apart, keeping eyes on target on wall __6__ feet away, tilt head down 15-30 and move head side to side for _60___ seconds. Repeat while moving head up and down for __60__ seconds. Do _3-5 sessions per day.  Gaze Stabilization: Tip Card  1.Target must remain in focus, not blurry, and appear stationary while head is in motion. 2.Perform exercises with small head movements (45 to either side of midline). 3.Increase speed of head motion so long as target is in focus. 4.If you wear eyeglasses, be sure you can see target through lens (therapist will give specific instructions for bifocal / progressive lenses). 5.These exercises may provoke dizziness or nausea. Work through these symptoms. If too dizzy, slow head movement slightly. Rest between each exercise. 6.Exercises demand concentration; avoid distractions. 7.For safety, perform standing exercises close to a counter, wall, corner, or next to someone.    Copyright  VHI. All rights reserved.

## 2015-08-01 NOTE — Therapy (Signed)
[facility] [address] [place], [place], [address] Phone: [phone]   Fax:  [phone]  Physical Therapy Evaluation  Patient Details  Name: [name] MRN: [id number] Date of Birth: 08/21/1981 Referring Provider:  Ward Givens, NP  Encounter Date: 07/29/2015      PT End of Session - 08/01/15 1340    Visit Number 1  G1   Number of Visits 9   Date for PT Re-Evaluation 08/29/15   Authorization Type Medicare   Authorization Time Period 07-29-15 - 09-22-15   Authorization - Visit Number 1   PT Start Time 1016   PT Stop Time 1101   PT Time Calculation (min) 45 min      Past Medical History  Diagnosis Date  . Hypertension   . Multiple sclerosis ([place])     Dr Lonzo Cloud  . GERD (gastroesophageal reflux disease)   . Regurgitation     cardiac valve. echo 2013 for first eval GILENYA,mod MR,TR  . Numbness in right leg 02/13,12/12  . Multiple sclerosis exacerbation ([place])   . Obesity (BMI 35.0-39.9 without comorbidity) ([place])   . Other malaise and fatigue 10/20/2013    Past Surgical History  Procedure Laterality Date  . Cholecystectomy  2010  . Back surgery  2006  . Cesarean section  2000  . Esophagogastroduodenoscopy  08/05/2008    Fields-incomplete Schatzki's ring, distal esophageal erosions/esophagitis(mild), small HH, NEGATIVE h pylori  . Colonoscopy  09/14/2010    Fields-hyperplastic polyps, int hemorrhoids  . Cyst excision  2006    pilonoidal  . US echocardiography  03/19/2012    RV mildly dilated,RA mod. dilated,strial septum aneurysmal,Mod. MR & TR    There were no vitals filed for this visit.  Visit Diagnosis:  Dizziness and giddiness - Plan: PT plan of care cert/re-cert  Abnormality of gait - Plan: PT plan of care cert/re-cert      Subjective Assessment - 08/01/15 1326    Subjective Pt reports vertigo started approximately 3 weeks ago and is worse with side to side head turns;  states that she most recently experienced it last Sunday during church; states that she was able to put her head down and it subsided; reports vertigo intensity is 3/10 at time of initial evaluation                                                                                                  Pertinent History Multiple Sclerosis; diplopia; dysphagia   Patient Stated Goals resolve the vertigo   Currently in Pain? No/denies            [facility] PT Assessment - 08/01/15 0001    Assessment   Medical Diagnosis Vertigo    Onset Date/Surgical Date --  early September 2016   Precautions   Precautions Other (comment)  Vertigo   Balance Screen   Has the patient fallen in the past 6 months No   Has the patient had a decrease in activity level because of a fear of falling?  No   Is the patient reluctant to leave their home because  of a fear of falling?  No   Prior Function   Level of Independence Independent   Observation/Other Assessments   Focus on Therapeutic Outcomes (FOTO)  45%  risk adjusted 58%   Other Surveys  Other Surveys   Dizziness Handicap Inventory [facility])  72%            Vestibular Assessment - 08/01/15 0001    Vestibular Assessment   General Observation pt is a 34 year old female ambulating with very guarded and slow gait speed due to c/o vertigo; head movement is minimal   Symptom Behavior   Type of Dizziness Unsteady with head/body turns   Frequency of Dizziness varies depending on movement   Duration of Dizziness approx. 10 secs   Aggravating Factors Turning head quickly   Relieving Factors Rest;Lying supine;Closing eyes   Occulomotor Exam   Occulomotor Alignment Normal   Visual Acuity   Static line 9   Dynamic line 6   Positional Testing   Dix-Hallpike Dix-Hallpike Right;Dix-Hallpike Left   Sidelying Test Sidelying Right   Dix-Hallpike Right   Dix-Hallpike Right Duration approx. 3 secs   Dix-Hallpike Right Symptoms No nystagmus  c/o vertigo with supine to  sit   Dix-Hallpike Left   Dix-Hallpike Left Duration none   Sidelying Right   Sidelying Right Duration 2-3 secs   Sidelying Right Symptoms No nystagmus                self care - pt instructed in HEP - gaze stabilization in standing for horizontal and vertical head turns       PT Education - 08/01/15 1339    Education provided Yes   Education Details gaze stabilizaiton   Person(s) Educated Patient   Methods Explanation;Demonstration;Handout   Comprehension Verbalized understanding;Returned demonstration             PT Long Term Goals - 08/01/15 1351    PT LONG TERM GOAL #1   Title Pt will improve score on Dizziness Handicap Index to </= 25% to indicate significant improvement in vertigo.  (08-29-15)   Baseline 72%   Time 4   Period Weeks   Status New   PT LONG TERM GOAL #2   Title Perform Sensory Organization Test and establish LTG as appropriate.  (08-29-15)   Time 4   Period Weeks   Status New   PT LONG TERM GOAL #3   Title Improve Dynamic visual acuity test to </= 2 line difference to indicate improved VOR function.  (08-29-15)   Baseline 3 line difference   Time 4   Period Weeks   Status New   PT LONG TERM GOAL #4   Title Independent in HEP for gaze stabilization and vestibular/balance exercises.  (08-29-15)   Time 4   Period Weeks   Status New               Plan - 08/01/15 1347    Clinical Impression Statement No nystagmus observed to indicate BPPV with only minimal incr. c/o vertigo in R Dix-Hallpike position; symptoms appear to be more consistent with VOR dysfunction with c/o vertgio with horizontal head movement   Pt will benefit from skilled therapeutic intervention in order to improve on the following deficits Difficulty walking;Dizziness;Decreased balance   Rehab Potential Good   PT Frequency 2x / week   PT Duration 4 weeks   PT Treatment/Interventions ADLs/Self Care Home Management;Canalith Repostioning;Neuromuscular  re-education;Balance training;Therapeutic exercise;Therapeutic activities;Vestibular;Patient/family education   PT Next Visit Plan recheck positional testing  for BPPV; do Sensory Organization Test (SOT)   PT Home Exercise Plan gaze stabilization; balance on foam exercises - depending of results of SOT   Consulted and Agree with Plan of Care Patient         Problem List Patient Active Problem List   Diagnosis Date Noted  . Photosensitivity 03/30/2015  . Vertigo, aural 03/30/2015  . Morbid obesity ([place]) 10/19/2014  . Diplopia 10/19/2014  . MS (multiple sclerosis) ([place]) 06/17/2014  . Abnormality of gait 06/17/2014  . Other malaise and fatigue 10/20/2013  . Insomnia 08/06/2013  . Mitral insufficiency 05/17/2013  . Tricuspid insufficiency 05/17/2013  . Multiple sclerosis ([place]) 01/23/2013  . Severe obesity (BMI >= 40) ([place]) 01/23/2013  . 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 Depression with anxiety 01/23/2013  . Anemia 02/22/2012  . Hematochezia 02/22/2012  . Globus sensation 02/22/2012  . Dysphagia 02/22/2012  . GERD 09/01/2010  . CONSTIPATION, CHRONIC 09/01/2010    Alda Lea, PT 08/01/2015, 1:59 PM  [facility] [address] [place], [place], [address] Phone: [phone]   Fax:  [phone]

## 2015-08-03 ENCOUNTER — Ambulatory Visit: Payer: Medicare Other | Admitting: Physical Therapy

## 2015-08-03 DIAGNOSIS — R42 Dizziness and giddiness: Secondary | ICD-10-CM

## 2015-08-03 DIAGNOSIS — R269 Unspecified abnormalities of gait and mobility: Secondary | ICD-10-CM | POA: Diagnosis not present

## 2015-08-04 ENCOUNTER — Encounter: Payer: Self-pay | Admitting: Physical Therapy

## 2015-08-04 NOTE — Therapy (Signed)
[facility] [address] [place], [place], [address] Phone: [phone]   Fax:  [phone]  Physical Therapy Treatment  Patient Details  Name: [name] MRN: [id number] Date of Birth: [birth year] Referring Provider:  Sharilyn Sites, MD  Encounter Date: 08/03/2015      PT End of Session - 08/04/15 1509    Visit Number 2  G2   Number of Visits 9   Date for PT Re-Evaluation 08/29/15   Authorization Type Medicare   Authorization Time Period 07-29-15 - 09-22-15   Authorization - Visit Number 2   PT Start Time 0932   PT Stop Time 1016   PT Time Calculation (min) 44 min   Equipment Utilized During Treatment Other (comment)  vest harness on balance master during testing      Past Medical History  Diagnosis Date  . Hypertension   . Multiple sclerosis ([place])     Dr Lonzo Cloud  . GERD (gastroesophageal reflux disease)   . Regurgitation     cardiac valve. echo 2013 for first eval GILENYA,mod MR,TR  . Numbness in right leg 02/13,12/12  . Multiple sclerosis exacerbation ([place])   . Obesity (BMI 35.0-39.9 without comorbidity) ([place])   . Other malaise and fatigue 10/20/2013    Past Surgical History  Procedure Laterality Date  . Cholecystectomy  2010  . Back surgery  2006  . Cesarean section  2000  . Esophagogastroduodenoscopy  08/05/2008    Fields-incomplete Schatzki's ring, distal esophageal erosions/esophagitis(mild), small HH, NEGATIVE h pylori  . Colonoscopy  09/14/2010    Fields-hyperplastic polyps, int hemorrhoids  . Cyst excision  2006    pilonoidal  . US echocardiography  03/19/2012    RV mildly dilated,RA mod. dilated,strial septum aneurysmal,Mod. MR & TR    There were no vitals filed for this visit.  Visit Diagnosis:  Dizziness and giddiness  Abnormality of gait      Subjective Assessment - 08/04/15 1501    Subjective Pt reports no vertigo at start of PT session but staes that vertigo was  very intense last night when she went to lie down in bed and turned her head   Pertinent History Multiple Sclerosis; diplopia; dysphagia   Patient Stated Goals resolve the vertigo   Currently in Pain? No/denies                Vestibular Assessment - 08/04/15 0001    Visual Acuity   Static line 8   Dynamic line 7  1 line difference (WNL's)   Positional Testing   Dix-Hallpike Dix-Hallpike Right;Dix-Hallpike Left   Dix-Hallpike Right   Dix-Hallpike Right Duration none    Dix-Hallpike Right Symptoms No nystagmus   Dix-Hallpike Left   Dix-Hallpike Left Duration none   Dix-Hallpike Left Symptoms No nystagmus   Sidelying Right   Sidelying Right Duration none   Sidelying Right Symptoms No nystagmus   Press photographer organization test   Positional Sensitivities   Sit to Supine No dizziness   Supine to Left Side No dizziness   Supine to Right Side No dizziness   Rolling Right No dizziness   Rolling Left No dizziness       Sensory Organization Test:  Composite score 66/100 with N= 70/100;  somatosensory input very slightly decreased at 88/100 with N=90/100 visual input WNL's with score 79/100 with N=73/100 Vestibular input decr. At 45/100 with N=55/100 Pt had no c/o vertigo when testing was completed and with pt stepping down from balance  master  Pt's COG is shift to R side - with more weight anteriorly than posteriorly                 PT Education - 08/04/15 1507    Education provided Yes   Education Details Brandt-Daroff exercises and instructed in standing on pillow with feet apart/together with EO and EC - no pictures given due to time constraint   Person(s) Educated Patient   Methods Explanation;Demonstration;Handout  handout for Brandt-Daroff only due to time constraint   Comprehension Verbalized understanding;Returned demonstration             PT Long Term Goals - 08/01/15 1351    PT LONG TERM GOAL #1   Title Pt will  improve score on Dizziness Handicap Index to </= 25% to indicate significant improvement in vertigo.  (08-29-15)   Baseline 72%   Time 4   Period Weeks   Status New   PT LONG TERM GOAL #2   Title Perform Sensory Organization Test and establish LTG as appropriate.  (08-29-15)   Time 4   Period Weeks   Status New   PT LONG TERM GOAL #3   Title Improve Dynamic visual acuity test to </= 2 line difference to indicate improved VOR function.  (08-29-15)   Baseline 3 line difference   Time 4   Period Weeks   Status New   PT LONG TERM GOAL #4   Title Independent in HEP for gaze stabilization and vestibular/balance exercises.  (08-29-15)   Time 4   Period Weeks   Status New        Etiology of vertigo is unknown at this time - unable to provoke vertigo during today's session - and no significant deficits Noted per SOT       Plan - 08/04/15 1511    Clinical Impression Statement No vertigo was able to be provoked during today's session; pt's symptoms today more indicative of BPPV but no positons nor Dix-Hallpike tests elicited nystagmus or vertigo; Dynamic visual acuity test WNL's today with only a 1 line difference - improved from  3 line difference at time of evaluation- this is indicative of VOR function WNL's - vestibular input only slightly decreased per SOT   Pt will benefit from skilled therapeutic intervention in order to improve on the following deficits Difficulty walking;Dizziness;Decreased balance   Rehab Potential Good   PT Frequency 2x / week   PT Duration 4 weeks   PT Treatment/Interventions ADLs/Self Care Home Management;Canalith Repostioning;Neuromuscular re-education;Balance training;Therapeutic exercise;Therapeutic activities;Vestibular;Patient/family education   PT Next Visit Plan recheck positional testing for any c/o's vertigo and nystagmus; habituation exercises if vertigo provoked; give pictures for balance on foam corner exercises - do vestibular/gait exercises   PT  Home Exercise Plan gaze stabilization; balance on foam exercises; Brandt-Daroff exercises   Consulted and Agree with Plan of Care Patient        Problem List Patient Active Problem List   Diagnosis Date Noted  . Photosensitivity 03/30/2015  . Vertigo, aural 03/30/2015  . Morbid obesity ([place]) 10/19/2014  . Diplopia 10/19/2014  . MS (multiple sclerosis) ([place]) 06/17/2014  . Abnormality of gait 06/17/2014  . Other malaise and fatigue 10/20/2013  . Insomnia 08/06/2013  . Mitral insufficiency 05/17/2013  . Tricuspid insufficiency 05/17/2013  . Multiple sclerosis ([place]) 01/23/2013  . Severe obesity (BMI >= 40) ([place]) 01/23/2013  . 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 Depression with anxiety 01/23/2013  . Anemia 02/22/2012  . Hematochezia 02/22/2012  .  Globus sensation 02/22/2012  . Dysphagia 02/22/2012  . GERD 09/01/2010  . CONSTIPATION, CHRONIC 09/01/2010    Alda Lea, PT 08/04/2015, 3:18 PM  [facility] [address] [place], [place], [address] Phone: [phone]   Fax:  [phone]

## 2015-08-04 NOTE — Patient Instructions (Signed)
Sit to Side-Lying    Sit on edge of bed. 1. Turn head 45 to right. 2. Maintain head position and lie down slowly on left side. Hold until symptoms subside. 3. Sit up slowly. Hold until symptoms subside. 4. Turn head 45 to left. 5. Maintain head position and lie down slowly on right side. Hold until symptoms subside. 6. Sit up slowly. Repeat sequence __5__ times per session. Do _3-5___ sessions per day. Lie down on most affected side first!!

## 2015-08-06 ENCOUNTER — Ambulatory Visit: Payer: Medicare Other | Admitting: Physical Therapy

## 2015-08-06 ENCOUNTER — Ambulatory Visit
Admission: RE | Admit: 2015-08-06 | Discharge: 2015-08-06 | Disposition: A | Discharge status: Home / Self Care | Payer: Medicare Other | Source: Ambulatory Visit | Attending: Adult Health | Admitting: Adult Health

## 2015-08-06 DIAGNOSIS — G35 Multiple sclerosis: Secondary | ICD-10-CM

## 2015-08-06 MED ORDER — GADOBENATE DIMEGLUMINE 529 MG/ML IV SOLN
20.0000 mL | Freq: Once | INTRAVENOUS | Status: AC | PRN
  Administered 2015-08-06: 20 mL via INTRAVENOUS

## 2015-08-09 ENCOUNTER — Telehealth: Payer: Self-pay

## 2015-08-09 NOTE — Telephone Encounter (Signed)
-----   Message from Ward Givens, NP sent at 08/09/2015  7:30 AM EDT ----- No definite change from previous scan. Please call the patient.

## 2015-08-09 NOTE — Telephone Encounter (Signed)
Spoke to patient. Gave MRI results. Patient verbalized understanding.  

## 2015-08-10 ENCOUNTER — Encounter: Payer: Self-pay | Admitting: Physical Therapy

## 2015-08-10 ENCOUNTER — Ambulatory Visit: Payer: Medicare Other | Admitting: Physical Therapy

## 2015-08-10 DIAGNOSIS — R42 Dizziness and giddiness: Secondary | ICD-10-CM | POA: Diagnosis not present

## 2015-08-10 DIAGNOSIS — R269 Unspecified abnormalities of gait and mobility: Secondary | ICD-10-CM

## 2015-08-10 NOTE — Therapy (Signed)
[facility] [address] [place], [place], [address] Phone: [phone]   Fax:  [phone]  Physical Therapy Treatment  Patient Details  Name: [name] MRN: [id number] Date of Birth: 1981/03/07 Referring Provider: Dr. Asencion Partridge Dohmeier  Encounter Date: 08/10/2015      PT End of Session - 08/10/15 1136    Visit Number 3   Number of Visits 9   Date for PT Re-Evaluation 08/29/15   Authorization Type Medicare   Authorization Time Period 07-29-15 - 09-22-15   Authorization - Visit Number 3   PT Start Time 1102   PT Stop Time 1130   PT Time Calculation (min) 28 min      Past Medical History  Diagnosis Date  . Hypertension   . Multiple sclerosis ([place])     Dr Lonzo Cloud  . GERD (gastroesophageal reflux disease)   . Regurgitation     cardiac valve. echo 2013 for first eval GILENYA,mod MR,TR  . Numbness in right leg 02/13,12/12  . Multiple sclerosis exacerbation ([place])   . Obesity (BMI 35.0-39.9 without comorbidity) ([place])   . Other malaise and fatigue 10/20/2013    Past Surgical History  Procedure Laterality Date  . Cholecystectomy  2010  . Back surgery  2006  . Cesarean section  2000  . Esophagogastroduodenoscopy  08/05/2008    Fields-incomplete Schatzki's ring, distal esophageal erosions/esophagitis(mild), small HH, NEGATIVE h pylori  . Colonoscopy  09/14/2010    Fields-hyperplastic polyps, int hemorrhoids  . Cyst excision  2006    pilonoidal  . US echocardiography  03/19/2012    RV mildly dilated,RA mod. dilated,strial septum aneurysmal,Mod. MR & TR    There were no vitals filed for this visit.  Visit Diagnosis:  Dizziness and giddiness  Abnormality of gait      Subjective Assessment - 08/10/15 1133    Subjective Pt. states vertigo has completely gone away - states she has been doing exercises at home   Pertinent History Multiple Sclerosis; diplopia; dysphagia   Patient Stated Goals  resolve the vertigo   Currently in Pain? No/denies       Positional testing - sit to right and left sidelying negative with no nystagmus and no c/o vertigo; sit to supine negative and supine to rolling R  L sides with no c/o vertigo and no nystagmus noted;  Standing - bending down to floor with 2 sec hold in this position and return to upright - no c/o vertigo  Pt performed standing on pillow with EO and EC and with head turns for HEP review - no LOB occurred and no c/o vertigo reported with these Exercises  Pt amb. 25' with horizontal head turns and  72' with vertical head turns without LOB and with no c/o vertigo     [facility] PT Assessment - 08/10/15 0001    Assessment   Referring Provider Dr. Asencion Partridge Dohmeier     Reviewed LTG's and progress - pt ready for D/C due to vertigo having resolved                        PT Education - 08/10/15 1134    Education provided Yes   Education Details Brandt-Daroff exercises and standing on pillow   Person(s) Educated Patient   Methods Explanation;Demonstration;Handout   Comprehension Verbalized understanding;Returned demonstration             PT Long Term Goals - 08/10/15 1138    PT LONG TERM GOAL #  1   Title Pt will improve score on Dizziness Handicap Index to </= 25% to indicate significant improvement in vertigo.  (08-29-15)   Baseline met August 22, 2015  with score 4%   Status Achieved   PT LONG TERM GOAL #2   Title Perform Sensory Organization Test and establish LTG as appropriate.  (08-29-15)   Baseline met 08-03-15   Status Achieved   PT LONG TERM GOAL #3   Title Improve Dynamic visual acuity test to </= 2 line difference to indicate improved VOR function.  (08-29-15)   Baseline 1 line difference on 08-03-15   Status Achieved   PT LONG TERM GOAL #4   Title Independent in HEP for gaze stabilization and vestibular/balance exercises.  (08-29-15)   Baseline met 08-22-2015   Status Achieved               Plan -  2015/08/22 1136    Clinical Impression Statement Pt reports no vertigo - appears to be resolved with no vertigo provoked with positional testing; all LTG's met - D/C today due to goals met   Pt will benefit from skilled therapeutic intervention in order to improve on the following deficits Difficulty walking;Dizziness;Decreased balance   Rehab Potential Good   PT Treatment/Interventions ADLs/Self Care Home Management;Canalith Repostioning;Neuromuscular re-education;Balance training;Therapeutic exercise;Therapeutic activities;Vestibular;Patient/family education   PT Next Visit Plan recheck positional testing for any c/o's vertigo and nystagmus; habituation exercises if vertigo provoked; give pictures for balance on foam corner exercises - do vestibular/gait exercises   PT Home Exercise Plan gaze stabilization; balance on foam exercises; Brandt-Daroff exercises   Consulted and Agree with Plan of Care Patient          G-Codes - 08-22-2015 1139    Functional Assessment Tool Used DVA 1 line difference; no c/o vertigo on 08/22/2015   Functional Limitation Other PT primary   Other PT Primary Goal Status (V2919) At least 1 percent but less than 20 percent impaired, limited or restricted   Other PT Primary Discharge Status (T6606) 0 percent impaired, limited or restricted      Problem List Patient Active Problem List   Diagnosis Date Noted  . Photosensitivity 03/30/2015  . Vertigo, aural 03/30/2015  . Morbid obesity ([place]) 10/19/2014  . Diplopia 10/19/2014  . MS (multiple sclerosis) ([place]) 06/17/2014  . Abnormality of gait 06/17/2014  . Other malaise and fatigue 10/20/2013  . Insomnia 08/06/2013  . Mitral insufficiency 05/17/2013  . Tricuspid insufficiency 05/17/2013  . Multiple sclerosis ([place]) 01/23/2013  . Severe obesity (BMI >= 40) ([place]) 01/23/2013  . 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 Depression with anxiety 01/23/2013  . Anemia 02/22/2012  . Hematochezia 02/22/2012  . Globus sensation  02/22/2012  . Dysphagia 02/22/2012  . GERD 09/01/2010  . CONSTIPATION, CHRONIC 09/01/2010   PHYSICAL THERAPY DISCHARGE SUMMARY  Visits from Start of Care: 3  Current functional level related to goals / functional outcomes: See above for LTG's   Remaining deficits: None regarding vertigo at this time  Education / Equipment: Pt. Has been instructed in a HEP consisting of habituation exercises and balance on foam to incr. vestibular input in maintaining balance Plan: Patient agrees to discharge.  Patient goals were met. Patient is being discharged due to meeting the stated rehab goals.  ?????       Alda Lea, PT 08/22/2015, 1:06 PM  [facility] [address] [place], [place], [address] Phone: [phone]   Fax:  [phone]  Name: Jessica Wirick  [name] MRN: [id number] Date of Birth: 06/24/1981

## 2015-08-10 NOTE — Patient Instructions (Signed)
Feet Apart (Compliant Surface) Arm Motion - Eyes Closed    Stand on compliant surface: __pillow______, feet shoulder width apart. Close eyes and move arms up and down: to front. Repeat __2__ times per session. Do _1___ sessions per day.  Copyright  VHI. All rights reserved.

## 2015-08-12 ENCOUNTER — Ambulatory Visit: Payer: Medicare Other | Admitting: Physical Therapy

## 2015-08-17 ENCOUNTER — Encounter: Payer: Medicare Other | Admitting: Physical Therapy

## 2015-08-24 ENCOUNTER — Encounter: Payer: Medicare Other | Admitting: Physical Therapy

## 2015-09-02 ENCOUNTER — Encounter: Payer: Medicare Other | Admitting: Physical Therapy

## 2015-09-30 ENCOUNTER — Ambulatory Visit (INDEPENDENT_AMBULATORY_CARE_PROVIDER_SITE_OTHER): Payer: Medicare Other | Admitting: Neurology

## 2015-09-30 ENCOUNTER — Encounter: Payer: Self-pay | Admitting: Neurology

## 2015-09-30 VITALS — BP 128/78 | HR 90 | Resp 20 | Ht 68.0 in | Wt 278.0 lb

## 2015-09-30 DIAGNOSIS — J321 Chronic frontal sinusitis: Secondary | ICD-10-CM | POA: Insufficient documentation

## 2015-09-30 DIAGNOSIS — J0111 Acute recurrent frontal sinusitis: Secondary | ICD-10-CM

## 2015-09-30 DIAGNOSIS — G35 Multiple sclerosis: Secondary | ICD-10-CM | POA: Diagnosis not present

## 2015-09-30 NOTE — Progress Notes (Signed)
PATIENT: [name] DOB: 04/03/1981  REASON FOR VISIT: follow up-multiple sclerosis, diplopia, vertigo HISTORY FROM: patient  HISTORY OF PRESENT ILLNESS:  [name] is a 34 year old female with a history of multiple sclerosis, and has since her last visit experienced a marked improvement in diplopia and vertigo.   She returns today for follow-up after a recent MRI of the brain and cervical spine. She remains on PLEGRITY, we discussed the results of her imaging studies and that I do not see a need to switch therapy. It was also remarkable that the sinusitis she had last autumn this year has resolved and I do wonder if this was the cause for her vertigo and diplopia and general unwellness. She has also felt that her vision has slightly improved and she has less headaches.  HISTORY  [facility]): [name] is a 34 year-old caucasian female with history of RRMS.  She returns today for sooner followup, due to diplopia and vertigo. Vertigo worse after mowing the lawn. Today still present. I suspect she is dehydrated.  03-30-15, she continued Plegredy pen and is doing well on it, q 14 days.  Patient was in the past unwilling to try tysabri. She has tried seroquel , which helped her to get to sleep at night and she is avoiding daytime naps. She checks on her grand parents , who are in poor health.   On 08/20/14, she called reporting double vision. This is how her exacerbations have started in the past. Dr. Brett Fairy gave 20 mg prednisone tabs on taper for the next month. She stated that the prednisone pack usually helps but not this time.She is almost finished with this.  No pain, both eyes she states are affected, R side more (states that she can not move the eye all the way over horizontally).The diplopia is giving her vertigo as well.  07/29/14: Started Ampyra, has been only able to tolerate once daily due to nausea. Feels like it has improved her ability to walk. Her father  who is with her states that he has seen a definite improvement in her mobility - but she still has pain in the right thigh. Last visit walking test showed: 54 seconds for 6 times 18 feet Walking with turns. Still complains of memory difficulties. Mood is some better. Nausea on Tecfidera, tolerated only 60 mg Bid with applesauce. Patient had a brain MRI 11-06-13 with acute enhancing lesions, left frontal. Here today to get a 60 mg bid prescription of tecfidera and to discuss Ampyra.   Dr. Benn Moulder mentioned her high degree of sleepiness. Sent for SPLIT study. Patient had a normal PSG, no significant AHi, no PLMs, no RDI and only spontaneous arousals.anxiety/ depression in the diff. diagnosis. She has considered taking stimulant medications but couldn't use these with her heart history. Her Use of Seroquel allowed for deep sleep at a 25 mg daily dose and she feels better.  Until than she still took naps 2 hours in the morning after her son leaves for school, slept from 23.00 hours to 06.30. Epworth sleepiness score no endorsed at 6 points and her fatigue severity score at 57 pints per liter very high. [name] in her last visit performed a [place] test , on which the patient scored 23/30 points indicating cognitive impairment or depression related to cognitive decline. Generate more than 7 bruits beginning was a certain left, and all he recalled 2/5 recall ports, test was repeated, the patient scored 26/30.today .  REVIEW OF SYSTEMS: Out of a complete 14 system review of symptoms, the patient complains only of the following symptoms, and all other reviewed systems are negative.  Light sensitivity,  back pain, dizziness  ALLERGIES: Allergies  Allergen Reactions  . Propoxyphene N-Acetaminophen Rash    HOME MEDICATIONS: Outpatient Prescriptions Prior to Visit  Medication Sig Dispense Refill  . ALPRAZolam (XANAX) 1 MG tablet Take 0.5 tablets (0.5 mg total) by mouth 2 (two) times daily. 60  tablet 5  . dalfampridine 10 MG TB12 Take 1 tablet (10 mg total) by mouth 2 (two) times daily. 60 tablet 5  . DULoxetine (CYMBALTA) 60 MG capsule Take 1 capsule (60 mg total) by mouth daily. 30 capsule 5  . ibuprofen (ADVIL,MOTRIN) 200 MG tablet Take 600 mg by mouth every 6 (six) hours as needed for mild pain or moderate pain.    . Peginterferon Beta-1a 125 MCG/0.5ML SOPN Inject 125 mcg into the skin every 14 (fourteen) days. 6 pen 3  . Cimetidine (EQ ACID REDUCER PO) Take 1-2 tablets by mouth daily as needed (FOR ACID REFLUX-RELIEF).    . QUEtiapine (SEROQUEL) 25 MG tablet Take 1 tablet (25 mg total) by mouth at bedtime. If too sedated, take 1/2 tablet (Patient not taking: Reported on 03/30/2015) 30 tablet 5   No facility-administered medications prior to visit.    PAST MEDICAL HISTORY: Past Medical History  Diagnosis Date  . Hypertension   . Multiple sclerosis ([place])     Dr Lonzo Cloud  . GERD (gastroesophageal reflux disease)   . Regurgitation     cardiac valve. echo 2013 for first eval GILENYA,mod MR,TR  . Numbness in right leg 02/13,12/12  . Multiple sclerosis exacerbation ([place])   . Obesity (BMI 35.0-39.9 without comorbidity) ([place])   . Other malaise and fatigue 10/20/2013    PAST SURGICAL HISTORY: Past Surgical History  Procedure Laterality Date  . Cholecystectomy  2010  . Back surgery  2006  . Cesarean section  2000  . Esophagogastroduodenoscopy  08/05/2008    Fields-incomplete Schatzki's ring, distal esophageal erosions/esophagitis(mild), small HH, NEGATIVE h pylori  . Colonoscopy  09/14/2010    Fields-hyperplastic polyps, int hemorrhoids  . Cyst excision  2006    pilonoidal  . US echocardiography  03/19/2012    RV mildly dilated,RA mod. dilated,strial septum aneurysmal,Mod. MR & TR    FAMILY HISTORY: Family History  Problem Relation Age of Onset  . Other Mother     lost leg, has trouble swallowing  . Heart attack Father   . Coronary artery disease Father   .  Diabetes Father   . Arthritis Father     rheumatoid  . Colon cancer Paternal Uncle 49  . Colon cancer Paternal Grandmother 77  . Diabetes Paternal Grandmother   . Cancer Paternal Grandmother     breast,lung  . Cancer Maternal Grandmother 55    kind unknown  . Diabetes Maternal Grandmother   . Myasthenia gravis Maternal Grandfather   . Other Sister     tumor on ovary  . ADD / ADHD Son   . Heart attack Paternal Grandfather   . Multiple sclerosis Other     paternal great aunt    SOCIAL HISTORY: Social History   Social History  . Marital Status: Married    Spouse Name: Divorced   . Number of Children: 1  . Years of Education: college   Occupational History  . disabled    Social History Main Topics  . Smoking status: Former Smoker --  1.00 packs/day for 20 years    Types: Cigarettes    Quit date: 02/15/2012  . Smokeless tobacco: Never Used  . Alcohol Use: No  . Drug Use: No  . Sexual Activity: No   Other Topics Concern  . Not on file   Social History Narrative   Patient is divorced and lives at home and he son lives with her.   Patient has one child.   Patient is disabled.   Patient has some college education.   Patient is right-handed.   Patient drinks 3 servings of caffeine daily            PHYSICAL EXAM  Filed Vitals:   09/30/15 1107  BP: 128/78  Pulse: 90  Resp: 20  Height: 5\' 8"  (1.727 m)  Weight: 278 lb (126.1 kg)   Body mass index is 42.28 kg/(m^2).  Generalized: Well developed, in no acute distress   Neurological examination  Mentation: Alert oriented to time, place, history taking. Follows all commands speech and language fluent Cranial nerve : No change in taste, no change in smell sensitivity. Headaches have no gustatory or olfactory aura.  Pupils were equal round reactive to light. Extraocular movements were full, visual field were full on confrontational test. End point  nystagmus noted.  Facial sensation and strength were normal.  Uvula tongue midline. Head turning and shoulder shrug  were normal and symmetric. Motor: The motor testing reveals bilateral symmetric grip strengths, I noted hip flexor weakness bilaterally and hip abductor weakness while shoulder shrug remains normal. Sensory: Sensory testing is intact to soft touch on all 4 extremities. No evidence of extinction is noted.  Coordination: Cerebellar testing reveals good finger-nose-finger test results slowing, finger-to-nose test shows dysmetria bilaterally. Gait and station: Gait is normal. Tandem gait is normal. Romberg is negative. No drift is seen.  Reflexes: Deep tendon reflexes are symmetric .  DIAGNOSTIC DATA (LABS, IMAGING, TESTING) - I reviewed patient records, labs, notes, testing and imaging myself where available.  Lab Results  Component Value Date   WBC 9.7 03/30/2015   HGB 9.8* 03/02/2015   HCT 34.0 03/30/2015   MCV 66.3* 03/02/2015   PLT 291 03/02/2015      Component Value Date/Time   NA 139 03/30/2015 1625   NA 138 03/02/2015 2100   K 4.3 03/30/2015 1625   K 4.2 02/28/2012 1113   CL 104 03/30/2015 1625   CL 107 02/28/2012 1113   CO2 22 03/30/2015 1625   CO2 23 02/28/2012 1113   GLUCOSE 99 03/30/2015 1625   GLUCOSE 102* 03/02/2015 2100   BUN 10 03/30/2015 1625   BUN 12 03/02/2015 2100   CREATININE 0.68 03/30/2015 1625   CREATININE 0.73 02/28/2012 1113   CALCIUM 9.8 03/30/2015 1625   CALCIUM 9.5 02/28/2012 1113   PROT 6.7 03/30/2015 1625   PROT 7.3 01/14/2014 2004   PROT 6.4 02/28/2012 1113   ALBUMIN 4.1 03/30/2015 1625   ALBUMIN 3.6 01/14/2014 2004   AST 14 03/30/2015 1625   AST 14 02/28/2012 1113   ALT 17 03/30/2015 1625   ALKPHOS 58 03/30/2015 1625   ALKPHOS 49 02/28/2012 1113   BILITOT <0.2 03/30/2015 1625   BILITOT <0.2* 01/14/2014 2004   GFRNONAA 114 03/30/2015 1625   GFRAA 131 03/30/2015 1625   Lab Results  Component Value Date   CHOL 110 12/09/2014   HDL 47 12/09/2014   LDLCALC 50 12/09/2014   TRIG 64  12/09/2014   CHOLHDL 2.3 12/09/2014   Lab Results  Component Value Date   TSH 3.700 12/09/2014      ASSESSMENT AND PLAN 34 y.o. year old female  has a past medical history of Hypertension; Multiple sclerosis ([place]); GERD (gastroesophageal reflux disease); Regurgitation; Numbness in right leg (02/13,12/12); Multiple sclerosis exacerbation ([place]); Obesity (BMI 35.0-39.9 without comorbidity) ([place]); and Other malaise and fatigue (10/20/2013). here with :  1. Vestibulitis him a attributed to sinusitis. Sinusitis resolved according to the new MRI of the brain.  2. Multiple sclerosis, again confirmed with her MRI the patient remains on PLEGRETY, her MRI of the brain did not show new lesions and was essentially unchanged in comparison to last years. The date of the study was 08-06-15, interpreted by Dr. Rachel Moulds.  The MRI of her cervical spine showed an enhancing lesion in the posterior spinal cord throughout the cervical vertebrae to the T1 level . The normal foramina were widely open there was no nerve root impingement and no degenerative change noted. A normal enhancement pattern resulted after infusion of contrast.   She was referred  for vestibular rehabilitation. She had 3 separate sessions with the vestibular therapist but had already felt that her symptoms had much improved before the therapy started. It was impossible to trigger her symptoms during the session.  The patient was anemic when I checked her blood cell counts last I will repeat that study today CBC and differential and CMET.  She has also established herself as a new primary care physician Dr. Manuella Ghazi in [facility]   Dr. Asencion Partridge Reubin Bushnell    09/30/2015, 11:18 AM [facility] [address], [place], [place] [address] [phone]

## 2015-10-01 ENCOUNTER — Other Ambulatory Visit: Payer: Self-pay | Admitting: Neurology

## 2015-10-01 LAB — CBC WITH DIFFERENTIAL/PLATELET
Basophils Absolute: 0 10*3/uL (ref 0.0–0.2)
Basos: 0 %
EOS (ABSOLUTE): 0.4 10*3/uL (ref 0.0–0.4)
EOS: 7 %
HEMATOCRIT: 32.1 % — AB (ref 34.0–46.6)
HEMOGLOBIN: 10.1 g/dL — AB (ref 11.1–15.9)
Immature Grans (Abs): 0 10*3/uL (ref 0.0–0.1)
Immature Granulocytes: 0 %
LYMPHS ABS: 2 10*3/uL (ref 0.7–3.1)
Lymphs: 33 %
MCH: 19.6 pg — ABNORMAL LOW (ref 26.6–33.0)
MCHC: 31.5 g/dL (ref 31.5–35.7)
MCV: 63 fL — ABNORMAL LOW (ref 79–97)
MONOCYTES: 8 %
Monocytes Absolute: 0.5 10*3/uL (ref 0.1–0.9)
NEUTROS PCT: 52 %
Neutrophils Absolute: 3.2 10*3/uL (ref 1.4–7.0)
Platelets: 335 10*3/uL (ref 150–379)
RBC: 5.14 x10E6/uL (ref 3.77–5.28)
RDW: 18.1 % — AB (ref 12.3–15.4)
WBC: 6.2 10*3/uL (ref 3.4–10.8)

## 2015-10-01 LAB — COMPREHENSIVE METABOLIC PANEL
ALBUMIN: 4.2 g/dL (ref 3.5–5.5)
ALK PHOS: 53 IU/L (ref 39–117)
ALT: 17 IU/L (ref 0–32)
AST: 16 IU/L (ref 0–40)
Albumin/Globulin Ratio: 1.8 (ref 1.1–2.5)
BUN/Creatinine Ratio: 16 (ref 8–20)
BUN: 10 mg/dL (ref 6–20)
CO2: 22 mmol/L (ref 18–29)
Calcium: 9.3 mg/dL (ref 8.7–10.2)
Chloride: 103 mmol/L (ref 97–106)
Creatinine, Ser: 0.64 mg/dL (ref 0.57–1.00)
GFR calc Af Amer: 133 mL/min/{1.73_m2} (ref >59)
GFR calc non Af Amer: 115 mL/min/{1.73_m2} (ref >59)
GLUCOSE: 104 mg/dL — AB (ref 65–99)
Globulin, Total: 2.4 g/dL (ref 1.5–4.5)
POTASSIUM: 5.2 mmol/L (ref 3.5–5.2)
Sodium: 140 mmol/L (ref 136–144)
Total Protein: 6.6 g/dL (ref 6.0–8.5)

## 2015-10-06 ENCOUNTER — Other Ambulatory Visit: Payer: Self-pay

## 2015-10-06 MED ORDER — ALPRAZOLAM 1 MG PO TABS: 0.5000 mg | ORAL_TABLET | Freq: Two times a day (BID) | ORAL | Status: DC

## 2015-10-12 DIAGNOSIS — Z87891 Personal history of nicotine dependence: Secondary | ICD-10-CM | POA: Diagnosis not present

## 2015-10-12 DIAGNOSIS — D649 Anemia, unspecified: Secondary | ICD-10-CM | POA: Diagnosis not present

## 2015-10-12 DIAGNOSIS — Z6839 Body mass index (BMI) 39.0-39.9, adult: Secondary | ICD-10-CM | POA: Diagnosis not present

## 2015-10-12 DIAGNOSIS — M419 Scoliosis, unspecified: Secondary | ICD-10-CM | POA: Diagnosis not present

## 2015-10-12 DIAGNOSIS — Z713 Dietary counseling and surveillance: Secondary | ICD-10-CM | POA: Diagnosis not present

## 2015-10-12 DIAGNOSIS — R5383 Other fatigue: Secondary | ICD-10-CM | POA: Diagnosis not present

## 2015-10-12 DIAGNOSIS — Z418 Encounter for other procedures for purposes other than remedying health state: Secondary | ICD-10-CM | POA: Diagnosis not present

## 2015-10-15 ENCOUNTER — Other Ambulatory Visit: Payer: Self-pay

## 2015-10-15 MED ORDER — DALFAMPRIDINE ER 10 MG PO TB12: 10.0000 mg | ORAL_TABLET | Freq: Two times a day (BID) | ORAL | Status: DC

## 2015-10-20 DIAGNOSIS — D509 Iron deficiency anemia, unspecified: Secondary | ICD-10-CM | POA: Diagnosis not present

## 2015-10-27 DIAGNOSIS — D509 Iron deficiency anemia, unspecified: Secondary | ICD-10-CM | POA: Diagnosis not present

## 2015-11-03 ENCOUNTER — Telehealth: Payer: Self-pay

## 2015-11-03 NOTE — Telephone Encounter (Signed)
[employer] has approved the request for coverage on Plegridy effective until 10/22/2016 Ref ID # [id number]

## 2015-11-12 DIAGNOSIS — Z0000 Encounter for general adult medical examination without abnormal findings: Secondary | ICD-10-CM | POA: Diagnosis not present

## 2015-11-12 DIAGNOSIS — R5383 Other fatigue: Secondary | ICD-10-CM | POA: Diagnosis not present

## 2015-11-12 DIAGNOSIS — Z79899 Other long term (current) drug therapy: Secondary | ICD-10-CM | POA: Diagnosis not present

## 2015-11-12 DIAGNOSIS — Z1389 Encounter for screening for other disorder: Secondary | ICD-10-CM | POA: Diagnosis not present

## 2015-11-12 DIAGNOSIS — Z418 Encounter for other procedures for purposes other than remedying health state: Secondary | ICD-10-CM | POA: Diagnosis not present

## 2015-11-12 DIAGNOSIS — Z6841 Body Mass Index (BMI) 40.0 and over, adult: Secondary | ICD-10-CM | POA: Diagnosis not present

## 2015-12-21 DIAGNOSIS — R6889 Other general symptoms and signs: Secondary | ICD-10-CM | POA: Diagnosis not present

## 2015-12-21 DIAGNOSIS — J101 Influenza due to other identified influenza virus with other respiratory manifestations: Secondary | ICD-10-CM | POA: Diagnosis not present

## 2016-03-23 DIAGNOSIS — Z299 Encounter for prophylactic measures, unspecified: Secondary | ICD-10-CM | POA: Diagnosis not present

## 2016-03-23 DIAGNOSIS — N390 Urinary tract infection, site not specified: Secondary | ICD-10-CM | POA: Diagnosis not present

## 2016-03-23 DIAGNOSIS — R109 Unspecified abdominal pain: Secondary | ICD-10-CM | POA: Diagnosis not present

## 2016-03-23 DIAGNOSIS — Z87891 Personal history of nicotine dependence: Secondary | ICD-10-CM | POA: Diagnosis not present

## 2016-04-05 ENCOUNTER — Telehealth: Payer: Self-pay | Admitting: Neurology

## 2016-04-05 ENCOUNTER — Encounter: Payer: Self-pay | Admitting: Neurology

## 2016-04-05 ENCOUNTER — Ambulatory Visit (INDEPENDENT_AMBULATORY_CARE_PROVIDER_SITE_OTHER): Payer: Medicare Other | Admitting: Neurology

## 2016-04-05 ENCOUNTER — Telehealth: Payer: Self-pay

## 2016-04-05 VITALS — BP 132/86 | HR 88 | Resp 20 | Ht 67.0 in | Wt 273.0 lb

## 2016-04-05 DIAGNOSIS — N946 Dysmenorrhea, unspecified: Secondary | ICD-10-CM | POA: Diagnosis not present

## 2016-04-05 DIAGNOSIS — R350 Frequency of micturition: Secondary | ICD-10-CM

## 2016-04-05 DIAGNOSIS — G35 Multiple sclerosis: Secondary | ICD-10-CM

## 2016-04-05 DIAGNOSIS — L568 Other specified acute skin changes due to ultraviolet radiation: Secondary | ICD-10-CM

## 2016-04-05 DIAGNOSIS — H81312 Aural vertigo, left ear: Secondary | ICD-10-CM

## 2016-04-05 MED ORDER — ALPRAZOLAM 1 MG PO TABS: 0.5000 mg | ORAL_TABLET | Freq: Two times a day (BID) | ORAL | Status: DC

## 2016-04-05 MED ORDER — PEGINTERFERON BETA-1A 125 MCG/0.5ML ~~LOC~~ SOPN: 125.0000 ug | PEN_INJECTOR | SUBCUTANEOUS | Status: DC

## 2016-04-05 MED ORDER — DALFAMPRIDINE ER 10 MG PO TB12: 10.0000 mg | ORAL_TABLET | Freq: Two times a day (BID) | ORAL | Status: DC

## 2016-04-05 NOTE — Progress Notes (Signed)
PATIENT: OSMARY SCHLEIFER DOB: 1981-04-12  REASON FOR VISIT: follow up-multiple sclerosis, diplopia, vertigo HISTORY FROM: patient  HISTORY OF PRESENT ILLNESS:  Mrs. Recla is a 35 year old female with a history of multiple sclerosis, and has since her last visit experienced a marked improvement in diplopia and vertigo.   She returns today for follow-up after a recent MRI of the brain and cervical spine. She remains on PLEGRITY, we discussed the results of her imaging studies and that I do not see a need to switch therapy. It was also remarkable that the sinusitis she had last autumn this year has resolved and I do wonder if this was the cause for her vertigo and diplopia and general unwellness. She has also felt that her vision has slightly improved and she has less headaches.  HISTORY  [facility]): Ms. Nashly Legendre is a 34 year-old caucasian female with history of RRMS.  She returns today for sooner followup, due to diplopia and vertigo. Vertigo worse after mowing the lawn. Today still present. I suspect she is dehydrated.  03-30-15, she continued Plegredy pen and is doing well on it, q 14 days.  Patient was in the past unwilling to try tysabri. She has tried seroquel , which helped her to get to sleep at night and she is avoiding daytime naps. She checks on her grand parents , who are in poor health.   On 08/20/14, she called reporting double vision. This is how her exacerbations have started in the past. Dr. Brett Fairy gave 20 mg prednisone tabs on taper for the next month. She stated that the prednisone pack usually helps but not this time.She is almost finished with this.  No pain, both eyes she states are affected, R side more (states that she can not move the eye all the way over horizontally).The diplopia is giving her vertigo as well.  07/29/14: Started Ampyra, has been only able to tolerate once daily due to nausea. Feels like it has improved her ability to walk. Her father  who is with her states that he has seen a definite improvement in her mobility - but she still has pain in the right thigh. Last visit walking test showed: 54 seconds for 6 times 18 feet Walking with turns. Still complains of memory difficulties. Mood is some better. Nausea on Tecfidera, tolerated only 60 mg Bid with applesauce. Patient had a brain MRI 11-06-13 with acute enhancing lesions, left frontal. Here today to get a 60 mg bid prescription of tecfidera and to discuss Ampyra.   Dr. Benn Moulder mentioned her high degree of sleepiness. Sent for SPLIT study. Patient had a normal PSG, no significant AHi, no PLMs, no RDI and only spontaneous arousals.anxiety/ depression in the diff. diagnosis. She has considered taking stimulant medications but couldn't use these with her heart history. Her Use of Seroquel allowed for deep sleep at a 25 mg daily dose and she feels better.  Until than she still took naps 2 hours in the morning after her son leaves for school, slept from 23.00 hours to 06.30. Epworth sleepiness score no endorsed at 6 points and her fatigue severity score at 57 pints per liter very high. Mrs. Messina in her last visit performed a [place] test , on which the patient scored 23/30 points indicating cognitive impairment or depression related to cognitive decline. Generate more than 7 bruits beginning was a certain left, and all he recalled 2/5 recall ports, test was repeated, the patient scored 26/30.today .   Interval history  from 04/05/2016, I have seen Dortha Kern by now for over a decade. And she brought today some problems that are not strictly neurologic to my attention. She has more frequent but not painful menstrual cycles, she no longer can count on a 28 day interval. I do not think that this is anything to do with her neurologic condition of multiple sclerosis or the treatment thereby. I do think he is to see her gynecologist. If she has myoma these may also affect her ability to empty  her bladder or her urine production. Usually fibromata  cause pain, she denies having any. She has urinary urge and has small volume of urine.    REVIEW OF SYSTEMS: Out of a complete 14 system review of symptoms, the patient complains only of the following symptoms, and all other reviewed systems are negative.  Light sensitivity,  back pain, dizziness  ALLERGIES: Allergies  Allergen Reactions  . Propoxyphene N-Acetaminophen Rash    HOME MEDICATIONS: Outpatient Prescriptions Prior to Visit  Medication Sig Dispense Refill  . ALPRAZolam (XANAX) 1 MG tablet Take 0.5 tablets (0.5 mg total) by mouth 2 (two) times daily. 60 tablet 5  . dalfampridine 10 MG TB12 Take 1 tablet (10 mg total) by mouth 2 (two) times daily. 60 tablet 5  . ibuprofen (ADVIL,MOTRIN) 200 MG tablet Take 600 mg by mouth every 6 (six) hours as needed for mild pain or moderate pain.    . Peginterferon Beta-1a 125 MCG/0.5ML SOPN Inject 125 mcg into the skin every 14 (fourteen) days. 6 pen 3  . ranitidine (ZANTAC) 150 MG tablet Take 150 mg by mouth 2 (two) times daily.    . DULoxetine (CYMBALTA) 60 MG capsule Take 1 capsule (60 mg total) by mouth daily. 30 capsule 5   No facility-administered medications prior to visit.    PAST MEDICAL HISTORY: Past Medical History  Diagnosis Date  . Hypertension   . Multiple sclerosis ([place])     Dr Lonzo Cloud  . GERD (gastroesophageal reflux disease)   . Regurgitation     cardiac valve. echo 2013 for first eval GILENYA,mod MR,TR  . Numbness in right leg 02/13,12/12  . Multiple sclerosis exacerbation ([place])   . Obesity (BMI 35.0-39.9 without comorbidity) ([place])   . Other malaise and fatigue 10/20/2013    PAST SURGICAL HISTORY: Past Surgical History  Procedure Laterality Date  . Cholecystectomy  2010  . Back surgery  2006  . Cesarean section  2000  . Esophagogastroduodenoscopy  08/05/2008    Fields-incomplete Schatzki's ring, distal esophageal erosions/esophagitis(mild), small  HH, NEGATIVE h pylori  . Colonoscopy  09/14/2010    Fields-hyperplastic polyps, int hemorrhoids  . Cyst excision  2006    pilonoidal  . US echocardiography  03/19/2012    RV mildly dilated,RA mod. dilated,strial septum aneurysmal,Mod. MR & TR    FAMILY HISTORY: Family History  Problem Relation Age of Onset  . Other Mother     lost leg, has trouble swallowing  . Thyroid nodules Mother   . Heart attack Father   . Coronary artery disease Father   . Diabetes Father   . Rheum arthritis Father   . Colon cancer Paternal Uncle 72  . Colon cancer Paternal Grandmother 50  . Diabetes Paternal Grandmother   . Cancer Paternal Grandmother     breast,lung  . Cancer Maternal Grandmother 6    kind unknown  . Diabetes Maternal Grandmother   . Myasthenia gravis Maternal Grandfather   . Other Sister  tumor on ovary  . ADD / ADHD Son   . Heart attack Paternal Grandfather   . Multiple sclerosis Other     paternal great aunt    SOCIAL HISTORY: Social History   Social History  . Marital Status: Married    Spouse Name: Divorced   . Number of Children: 1  . Years of Education: college   Occupational History  . disabled    Social History Main Topics  . Smoking status: Former Smoker -- 1.00 packs/day for 20 years    Types: Cigarettes    Quit date: 02/15/2012  . Smokeless tobacco: Never Used  . Alcohol Use: No  . Drug Use: No  . Sexual Activity: No   Other Topics Concern  . Not on file   Social History Narrative   Patient is divorced and lives at home and he son lives with her.   Patient has one child.   Patient is disabled.   Patient has some college education.   Patient is right-handed.   Patient drinks 3 servings of caffeine daily            PHYSICAL EXAM  Filed Vitals:   04/05/16 1540  BP: 132/86  Pulse: 88  Resp: 20  Height: 5\' 7"  (1.702 m)  Weight: 273 lb (123.832 kg)   Body mass index is 42.75 kg/(m^2).  Generalized: Well developed, in no acute  distress   Neurological examination  Mentation: Alert oriented to time, place, history taking. Follows all commands speech and language fluent Cranial nerve : No change in taste, no change in smell sensitivity. Headaches have no gustatory or olfactory aura.  Pupils were equal round reactive to light. Extraocular movements were full, visual field were full on confrontational test.  End point  nystagmus noted. Facial sensation and strength were normal. Uvula tongue midline. Head turning and shoulder shrug  were normal and symmetric. Motor: The motor testing reveals bilateral symmetric grip strengths, I noted hip flexor weakness bilaterally and hip abductor weakness while shoulder shrug remains normal.  Sensory: Sensory testing is intact to soft touch on all 4 extremities. No evidence of extinction is noted.  Coordination: Cerebellar testing reveals good finger-nose-finger test results slowing, finger-to-nose test shows dysmetria bilaterally. Gait and station: Gait is normal. Tandem gait is normal. Romberg is negative. No drift is seen.  Reflexes: Deep tendon reflexes are symmetric .  DIAGNOSTIC DATA (LABS, IMAGING, TESTING) - I reviewed patient records, labs, notes, testing and imaging myself where available.  Lab Results  Component Value Date   WBC 6.2 09/30/2015   HGB 9.8* 03/02/2015   HCT 32.1* 09/30/2015   MCV 63* 09/30/2015   PLT 335 09/30/2015      Component Value Date/Time   NA 140 09/30/2015 1147   NA 138 03/02/2015 2100   K 5.2 09/30/2015 1147   K 4.2 02/28/2012 1113   CL 103 09/30/2015 1147   CL 107 02/28/2012 1113   CO2 22 09/30/2015 1147   CO2 23 02/28/2012 1113   GLUCOSE 104* 09/30/2015 1147   GLUCOSE 102* 03/02/2015 2100   BUN 10 09/30/2015 1147   BUN 12 03/02/2015 2100   CREATININE 0.64 09/30/2015 1147   CREATININE 0.73 02/28/2012 1113   CALCIUM 9.3 09/30/2015 1147   CALCIUM 9.5 02/28/2012 1113   PROT 6.6 09/30/2015 1147   PROT 7.3 01/14/2014 2004   PROT 6.4  02/28/2012 1113   ALBUMIN 4.2 09/30/2015 1147   ALBUMIN 3.6 01/14/2014 2004   AST 16 09/30/2015 1147  AST 14 02/28/2012 1113   ALT 17 09/30/2015 1147   ALKPHOS 53 09/30/2015 1147   ALKPHOS 49 02/28/2012 1113   BILITOT <0.2 09/30/2015 1147   BILITOT <0.2* 01/14/2014 2004   GFRNONAA 115 09/30/2015 1147   GFRAA 133 09/30/2015 1147   Lab Results  Component Value Date   CHOL 110 12/09/2014   HDL 47 12/09/2014   LDLCALC 50 12/09/2014   TRIG 64 12/09/2014   CHOLHDL 2.3 12/09/2014   Lab Results  Component Value Date   TSH 3.700 12/09/2014      ASSESSMENT AND PLAN 35 y.o. year old female  has a past medical history of Hypertension; Multiple sclerosis ([place]); GERD (gastroesophageal reflux disease); Regurgitation; Numbness in right leg (02/13,12/12); Multiple sclerosis exacerbation ([place]); Obesity (BMI 35.0-39.9 without comorbidity) ([place]); and Other malaise and fatigue (10/20/2013). here with :  1.  Sinusitis resolved according to the new MRI of the brain.  2. Multiple sclerosis,  confirmed with her MRI the patient remains on PLEGRETY, her MRI of the brain did not show new lesions and was essentially unchanged in comparison to last years. The date of the study was 08-06-15, interpreted by Dr. Felecia Shelling. he MRI of her cervical spine showed an enhancing lesion in the posterior spinal cord throughout the cervical vertebrae to the T1 level . The normal foramina were widely open there was no nerve root impingement and no degenerative change noted. A normal enhancement pattern resulted after infusion of contrast.  #3 dysmenorrhagia. Daine is young to be perimenopausal, and her last and only pregnancy is 16 years ago. She has a family history positive for fibroma which affected her mother . She sees Dr. Glo Herring in [place] for gynecology. Her last pelvic exam was last February.  #4 urinary urge but very low urine volume. I would like for her to leave a urine sample here that the can check for  urinary tract infection, it could also be related to a neurogenic bladder disorder with a mass. More typical is the neurogenic bladder with an overflow syndrome patient is for why not aware that the bladder is already full and that has to go with great urge and is at risk of accident. She feels that she does not eliminate much urine but she also doesn't feel that the bladder retains urine.  The patient was anemic when I checked her blood cell counts last I will repeat that study today CBC and differential and CMET. Urine analysis, FSH and LH. She has also established herself as a new primary care physician Dr. Manuella Ghazi in [place],  Referral to urology for bladder work up. Harold Hedge , MD , [facility] [address] .[facility] speciality physician   Dr. Asencion Partridge Theotis Gerdeman    04/05/2016, 4:03 PM [facility] [address], [place], [place] [address] [phone]

## 2016-04-05 NOTE — Patient Instructions (Signed)
Neurogenic Bladder Neurogenic bladder is a bladder control disorder. It is caused by problems with the nerves that control your bladder. This condition may make your bladder overactive or underactive. You may have trouble holding your urine or passing your urine (urinating). The bladder is a hollow organ in the lower part of your abdomen. It stores urine after the urine is made by your kidneys. Normally, when your bladder is not full, the muscles that control your bladder are relaxed. When your bladder fills with urine, nerve signals are sent to your brain, indicating that your bladder is full. Your brain then sends signals through your spinal cord to the muscles in your bladder that start and stop urine flow. If you have neurogenic bladder, the nerves and muscles do not work together the way they should. CAUSES  Any kind of nerve damage or condition that disrupts the signals from your brain to your bladder can cause neurogenic bladder. Many things can cause these nerve problems. They include:  A disease that affects the nervous system, such as:  Alzheimer disease.  Cerebral palsy.  Multiple sclerosis.  Diabetes.  Parkinson disease.  Damage to your brain or spinal cord from:  Trauma.  Tumor.  Infection.  Surgery.  Alcohol abuse.  Stroke.  A spinal cord birth defect. RISK FACTORS Having nerve damage or a nerve disorder puts you at risk for neurogenic bladder. SIGNS AND SYMPTOMS  Signs and symptoms of neurogenic bladder include:  Leaking or gushing urine (incontinence).  A sudden, strong urge to pass urine (urgency).  Frequent urination during the day and night.  Being unable to empty your bladder completely (urinary retention).  Frequent urinary tract infections. DIAGNOSIS  Your health care provider may diagnose neurogenic bladder based on your symptoms and medical history. A physical exam will also be done. You may be asked to keep a record of your bladder symptoms  and the times that you urinate (bladder diary). Your health care provider may also do several tests to help diagnose neurogenic bladder, including:  A urine test to check for infection.  A bladder scan after you urinate to see how much urine is left in your bladder.  Various tests to measure your urine flow and see how well the flow is controlled (urodynamic tests).  A procedure that involves using a tool that is like a very thin telescope to look through your urethra into your bladder (cystoscopy). A health care provider who specializes in the urinary tract (urologist) may do this test.  Imaging tests of your brain or spine. TREATMENT  Treatment depends on the cause of your neurogenic bladder and the symptoms you are having. Work closely with your health care provider to find the treatments that will improve your quality of life. Treatment options include:  Learning ways to control when you urinate, such as:  Urinating at scheduled times.  Training yourself to delay urination.  Doing exercises (Kegel exercises) to strengthen the muscles that control urine flow.  Avoiding foods or drinks that make your symptoms worse.  Taking medicines to:  Stimulate an underactive bladder.  Relax an overactive bladder.  Treat a urinary tract infection.  Learning how to use a thin tube (catheter) to empty your bladder. A catheter is a hollow tube that you pass through your urethra.  Procedures to stimulate the nerves that control your bladder.  Surgical procedures if other treatments do not help. HOME CARE INSTRUCTIONS   Keep a bladder diary to find out which foods, liquids, or activities  make your symptoms worse.  Use your bladder diary to schedule bathroom trips. If you are away from home, plan to be near a bathroom when your schedule says you need one.  Do Kegel exercises to strengthen the muscles that control urination. These muscles are the ones you use to try to hold urine when you  need to go. To do Kegel exercises:  Squeeze these muscles tight and hold for about 10 seconds.  Repeat three times.  Do these exercises often during the day when you do not have to urinate.  Limit your drinking of beverages that stimulate urination. These include soda, coffee, and tea.  After urinating, wait a few minutes and try again (double voiding).  Make sure you urinate just before you leave the house and just before you go to bed.  Take medicines only as directed by your health care provider.  Keep all follow-up visits as directed by your health care provider. This is important. SEEK MEDICAL CARE IF:   You are having a hard time controlling your symptoms.  Your symptoms are getting worse.  You have signs of a urinary tract infection:  A burning feeling when you urinate.  Chills.  Fever. SEEK IMMEDIATE MEDICAL CARE IF:  You cannot pass urine.    This information is not intended to replace advice given to you by your health care provider. Make sure you discuss any questions you have with your health care provider.   Document Released: 04/22/2007 Document Revised: 10/30/2014 Document Reviewed: 01/20/2014 Elsevier Interactive Patient Education [employer].

## 2016-04-05 NOTE — Telephone Encounter (Signed)
Pt made it to her appt on time. Thanks.

## 2016-04-05 NOTE — Telephone Encounter (Signed)
Rx for plegridy faxed to [place]. Received a receipt of confirmation.

## 2016-04-05 NOTE — Telephone Encounter (Signed)
FYI, Patient called 6/14 3:15pm to advise she got stuck in roadblock on [address] and will be here in 5 minutes for 3:30pm appointment.

## 2016-04-06 LAB — CBC WITH DIFFERENTIAL/PLATELET
BASOS: 0 %
Basophils Absolute: 0 10*3/uL (ref 0.0–0.2)
EOS (ABSOLUTE): 0.4 10*3/uL (ref 0.0–0.4)
Eos: 5 %
HEMATOCRIT: 36 % (ref 34.0–46.6)
Hemoglobin: 12.3 g/dL (ref 11.1–15.9)
IMMATURE GRANS (ABS): 0 10*3/uL (ref 0.0–0.1)
Immature Granulocytes: 0 %
Lymphocytes Absolute: 2 10*3/uL (ref 0.7–3.1)
Lymphs: 23 %
MCH: 26.7 pg (ref 26.6–33.0)
MCHC: 34.2 g/dL (ref 31.5–35.7)
MCV: 78 fL — AB (ref 79–97)
Monocytes Absolute: 0.6 10*3/uL (ref 0.1–0.9)
Monocytes: 7 %
NEUTROS ABS: 5.5 10*3/uL (ref 1.4–7.0)
Neutrophils: 65 %
Platelets: 319 10*3/uL (ref 150–379)
RBC: 4.6 x10E6/uL (ref 3.77–5.28)
RDW: 14.6 % (ref 12.3–15.4)
WBC: 8.5 10*3/uL (ref 3.4–10.8)

## 2016-04-06 LAB — COMPREHENSIVE METABOLIC PANEL
A/G RATIO: 1.5 (ref 1.2–2.2)
ALT: 18 IU/L (ref 0–32)
AST: 18 IU/L (ref 0–40)
Albumin: 4.1 g/dL (ref 3.5–5.5)
Alkaline Phosphatase: 58 IU/L (ref 39–117)
BUN/Creatinine Ratio: 11 (ref 9–23)
BUN: 7 mg/dL (ref 6–20)
Bilirubin Total: 0.2 mg/dL (ref 0.0–1.2)
CO2: 23 mmol/L (ref 18–29)
Calcium: 9.9 mg/dL (ref 8.7–10.2)
Chloride: 102 mmol/L (ref 96–106)
Creatinine, Ser: 0.66 mg/dL (ref 0.57–1.00)
GFR calc non Af Amer: 114 mL/min/{1.73_m2} (ref >59)
GFR, EST AFRICAN AMERICAN: 131 mL/min/{1.73_m2} (ref >59)
Globulin, Total: 2.7 g/dL (ref 1.5–4.5)
Glucose: 95 mg/dL (ref 65–99)
POTASSIUM: 4.4 mmol/L (ref 3.5–5.2)
Sodium: 142 mmol/L (ref 134–144)
TOTAL PROTEIN: 6.8 g/dL (ref 6.0–8.5)

## 2016-04-06 LAB — FSH/LH
FSH: 15.9 m[IU]/mL
LH: 34.6 m[IU]/mL

## 2016-04-07 LAB — URINALYSIS, COMPLETE
BILIRUBIN UA: NEGATIVE
Glucose, UA: NEGATIVE
KETONES UA: NEGATIVE
Leukocytes, UA: NEGATIVE
Nitrite, UA: NEGATIVE
Protein, UA: NEGATIVE
RBC, UA: NEGATIVE
SPEC GRAV UA: 1.029 (ref 1.005–1.030)
Urobilinogen, Ur: 0.2 mg/dL (ref 0.2–1.0)
pH, UA: 5.5 (ref 5.0–7.5)

## 2016-04-07 LAB — MICROSCOPIC EXAMINATION: Casts: NONE SEEN /lpf

## 2016-04-07 LAB — CULTURE, URINE COMPREHENSIVE

## 2016-04-10 ENCOUNTER — Telehealth: Payer: Self-pay

## 2016-04-10 NOTE — Telephone Encounter (Signed)
I spoke to pt and advised her that Dr. Brett Fairy said that her labs looked better than they have in a long time. Her UA showed cloudy urine with some crystals but it does not look like she has a UTI. Dr. Brett Fairy recommends that pt's PCP look at her kidney shape and size and consider an ultrasound. Pt report's that her PPC is Dr. Manuella Ghazi, and is asking that I send these results to him. Pt will call Dr. Manuella Ghazi and make an appt. Pt verbalized understanding of results. Pt had no questions at this time but was encouraged to call back if questions arise.

## 2016-04-10 NOTE — Telephone Encounter (Signed)
-----   Message from Larey Seat, MD sent at 04/07/2016  1:44 PM EDT ----- Dear [name], Your labs looked better than in a long time, metabolic, CBC and with the Urine analysis , too. Cloudy urine with some crystals, no blood , no bacteria. Not a UTI. Need to have your PCP look at Kidney shape and size, [place] should suffice. CD  Cc PCP

## 2016-04-13 ENCOUNTER — Ambulatory Visit (INDEPENDENT_AMBULATORY_CARE_PROVIDER_SITE_OTHER): Payer: Medicare Other | Admitting: Adult Health

## 2016-04-13 ENCOUNTER — Encounter: Payer: Self-pay | Admitting: Adult Health

## 2016-04-13 VITALS — BP 112/80 | HR 64 | Ht 68.0 in | Wt 271.5 lb

## 2016-04-13 DIAGNOSIS — N921 Excessive and frequent menstruation with irregular cycle: Secondary | ICD-10-CM | POA: Diagnosis not present

## 2016-04-13 DIAGNOSIS — N23 Unspecified renal colic: Secondary | ICD-10-CM | POA: Diagnosis not present

## 2016-04-13 HISTORY — DX: Excessive and frequent menstruation with irregular cycle: N92.1

## 2016-04-13 NOTE — Progress Notes (Signed)
Subjective:     Patient ID: [name], female   DOB: [birth year], 35 y.o.   MRN: [id number]  HPI [name] is a 35 year old white female in complaining of having 2 periods per month for last 4-5 months and bleeds heavy, changes every 3 hours or so and has some cramps and periods last 6-7 days. She says she is always hot.  Review of Systems  2 periods per month, heavy, changes every 3 hours and periods last 6-7 days, has cramps but not terrible,always hot  Reviewed past medical,surgical, social and family history. Reviewed medications and allergies.     Objective:   Physical Exam BP 112/80 mmHg  Pulse 64  Ht 5\' 8"  (1.727 m)  Wt 271 lb 8 oz (123.152 kg)  BMI 41.29 kg/m2  LMP 03/27/2016 Skin warm and dry.Pelvic: external genitalia is normal in appearance no lesions, vagina: scant discharge without odor,urethra has no lesions or masses noted, cervix:smooth and bulbous, uterus: normal size, shape and contour, non tender, no masses felt, adnexa: no masses or tenderness noted. Bladder is non tender and no masses felt. Abdomen is soft and non tender.Will get [place] to assess uterus and discussed ablation as possible option. Face time 15 minutes with 50 % counseling.     Assessment:     Menorrhagia with irregular cycles     Plan:     Return in 1 week for gyn [place] Review handout on ablation

## 2016-04-13 NOTE — Patient Instructions (Signed)

## 2016-04-19 DIAGNOSIS — N159 Renal tubulo-interstitial disease, unspecified: Secondary | ICD-10-CM | POA: Diagnosis not present

## 2016-04-19 DIAGNOSIS — N23 Unspecified renal colic: Secondary | ICD-10-CM | POA: Diagnosis not present

## 2016-04-20 ENCOUNTER — Other Ambulatory Visit: Payer: Medicare Other

## 2016-04-20 DIAGNOSIS — R39198 Other difficulties with micturition: Secondary | ICD-10-CM | POA: Diagnosis not present

## 2016-04-24 ENCOUNTER — Ambulatory Visit (INDEPENDENT_AMBULATORY_CARE_PROVIDER_SITE_OTHER): Payer: Medicare Other

## 2016-04-24 ENCOUNTER — Telehealth: Payer: Self-pay | Admitting: Adult Health

## 2016-04-24 ENCOUNTER — Encounter: Payer: Self-pay | Admitting: Adult Health

## 2016-04-24 DIAGNOSIS — D259 Leiomyoma of uterus, unspecified: Secondary | ICD-10-CM

## 2016-04-24 DIAGNOSIS — D251 Intramural leiomyoma of uterus: Secondary | ICD-10-CM | POA: Diagnosis not present

## 2016-04-24 DIAGNOSIS — N83202 Unspecified ovarian cyst, left side: Secondary | ICD-10-CM

## 2016-04-24 DIAGNOSIS — D219 Benign neoplasm of connective and other soft tissue, unspecified: Secondary | ICD-10-CM | POA: Insufficient documentation

## 2016-04-24 DIAGNOSIS — N83209 Unspecified ovarian cyst, unspecified side: Secondary | ICD-10-CM

## 2016-04-24 DIAGNOSIS — N854 Malposition of uterus: Secondary | ICD-10-CM | POA: Diagnosis not present

## 2016-04-24 DIAGNOSIS — N921 Excessive and frequent menstruation with irregular cycle: Secondary | ICD-10-CM | POA: Diagnosis not present

## 2016-04-24 HISTORY — DX: Unspecified ovarian cyst, unspecified side: N83.209

## 2016-04-24 HISTORY — DX: Benign neoplasm of connective and other soft tissue, unspecified: D21.9

## 2016-04-24 NOTE — Progress Notes (Signed)
PELVIC US TA/TV: anteverted uterus w/ a post intramural fibroid 1.2 x 1.3 x .8 cm,normal right ov,complex cyst left ovary w/low level echoes 4.4 x 4.1 x 4.6 cm (? Endometrioma),EEC 9.6 mm,no pain during ultrasound,ov's appear to be mobile,no free fluid seen

## 2016-04-24 NOTE — Telephone Encounter (Signed)
Pt aware she has small fibroid and left ovarian cyst, discussed with Dr Glo Herring who suggests follow up US in 6 weeks and if still there, remove it and do ablation, which she wants at same time.She will call office this week and schedule [place] in 6 weeks

## 2016-04-26 ENCOUNTER — Telehealth: Payer: Self-pay | Admitting: Neurology

## 2016-04-26 NOTE — Telephone Encounter (Signed)
Pt has [place] results. Dr. Glo Herring sent them to pt and they are in her chart. Pt had [place] on kidney and she has benign tumor. May call her

## 2016-05-01 NOTE — Telephone Encounter (Signed)
See "imaging" tab to see US pelvic and US transvaginal results.

## 2016-05-01 NOTE — Telephone Encounter (Signed)
there is a myoma/ fibroma in the uterus- folow up with Ob Gyn.

## 2016-05-02 NOTE — Telephone Encounter (Signed)
I spoke to [name] and advised her that Dr. Brett Fairy did receive and review [name]'s [place] results and encouraged [name] to follow up with her OBGYN. [name] says that she will follow up with the OBGYN in 6 weeks as directed. I encouraged [name] to keep [place] updated and to call with any questions or concerns.

## 2016-06-02 ENCOUNTER — Other Ambulatory Visit: Payer: Self-pay | Admitting: Obstetrics and Gynecology

## 2016-06-02 DIAGNOSIS — N83202 Unspecified ovarian cyst, left side: Secondary | ICD-10-CM

## 2016-06-05 ENCOUNTER — Other Ambulatory Visit: Payer: Medicare Other

## 2016-06-05 ENCOUNTER — Ambulatory Visit (INDEPENDENT_AMBULATORY_CARE_PROVIDER_SITE_OTHER): Payer: Medicare Other

## 2016-06-05 DIAGNOSIS — N854 Malposition of uterus: Secondary | ICD-10-CM

## 2016-06-05 DIAGNOSIS — D251 Intramural leiomyoma of uterus: Secondary | ICD-10-CM

## 2016-06-05 DIAGNOSIS — N83202 Unspecified ovarian cyst, left side: Secondary | ICD-10-CM | POA: Diagnosis not present

## 2016-06-05 NOTE — Progress Notes (Addendum)
PELVIC US TA/TV: Anteverted uterus w/ a post intramural fibroid 2.1 x 1.2 x 1.7 cm,normal right ov, complex left ovarian cyst w/ low level echoes 5 x 4.9 x 4.5 cm (slightly larger),EEC 11.9 mm,no free fluid,no pain during ultrasound,ov's appear to be mobile

## 2016-06-06 ENCOUNTER — Telehealth: Payer: Self-pay | Admitting: Adult Health

## 2016-06-06 NOTE — Telephone Encounter (Signed)
Pt aware US shows complex cyst on left and it may be a little larger, she wants to proceed with ablation and having cyst removed. She wants to see Dr Glo Herring, will make appt.

## 2016-06-07 DIAGNOSIS — N921 Excessive and frequent menstruation with irregular cycle: Secondary | ICD-10-CM | POA: Diagnosis not present

## 2016-06-07 DIAGNOSIS — R8290 Unspecified abnormal findings in urine: Secondary | ICD-10-CM | POA: Diagnosis not present

## 2016-06-07 DIAGNOSIS — N83202 Unspecified ovarian cyst, left side: Secondary | ICD-10-CM | POA: Diagnosis not present

## 2016-06-07 DIAGNOSIS — R339 Retention of urine, unspecified: Secondary | ICD-10-CM | POA: Diagnosis not present

## 2016-06-13 DIAGNOSIS — S91109A Unspecified open wound of unspecified toe(s) without damage to nail, initial encounter: Secondary | ICD-10-CM | POA: Diagnosis not present

## 2016-06-15 ENCOUNTER — Encounter: Payer: Medicare Other | Admitting: Obstetrics and Gynecology

## 2016-06-15 DIAGNOSIS — S91222A Laceration with foreign body of left great toe with damage to nail, initial encounter: Secondary | ICD-10-CM | POA: Diagnosis not present

## 2016-06-16 ENCOUNTER — Ambulatory Visit (INDEPENDENT_AMBULATORY_CARE_PROVIDER_SITE_OTHER): Payer: Medicare Other | Admitting: Obstetrics and Gynecology

## 2016-06-16 ENCOUNTER — Encounter: Payer: Self-pay | Admitting: Obstetrics and Gynecology

## 2016-06-16 VITALS — BP 108/74 | HR 76 | Ht 68.0 in | Wt 268.0 lb

## 2016-06-16 DIAGNOSIS — N938 Other specified abnormal uterine and vaginal bleeding: Secondary | ICD-10-CM | POA: Diagnosis not present

## 2016-06-16 DIAGNOSIS — N920 Excessive and frequent menstruation with regular cycle: Secondary | ICD-10-CM

## 2016-06-16 NOTE — Progress Notes (Signed)
Preoperative History and Physical  [name] is a 35 y.o. G1P1 here for surgical management of menorrhagia. Pt reports ongoing episodes of vaginal bleeding x 6 days. She reports anemia, lightheadedness, and states she has been having periods twice a months for the last 6 months. No significant preoperative concerns. Pt had [place] on 06/05/16 that showed Anteverted uterus w/ a post intramural fibroid, normal right ovary, complex left ovarian cyst. Pt is not sexually active and has no desire for more children.  Proposed surgery: Endometrial Ablation  Past Medical History:  Diagnosis Date  . Fibroid 04/24/2016  . GERD (gastroesophageal reflux disease)   . Hypertension   . Menorrhagia with irregular cycle 04/13/2016  . Multiple sclerosis ([place])    Dr Lonzo Cloud  . Multiple sclerosis exacerbation ([place])   . Numbness in right leg 02/13,12/12  . Obesity (BMI 35.0-39.9 without comorbidity) ([place])   . Other malaise and fatigue 10/20/2013  . Ovarian cyst 04/24/2016  . Regurgitation    cardiac valve. echo 2013 for first eval GILENYA,mod MR,TR   Past Surgical History:  Procedure Laterality Date  . BACK SURGERY  2006  . CESAREAN SECTION  2000  . CHOLECYSTECTOMY  2010  . COLONOSCOPY  09/14/2010   Fields-hyperplastic polyps, int hemorrhoids  . CYST EXCISION  2006   pilonoidal  . ESOPHAGOGASTRODUODENOSCOPY  08/05/2008   Fields-incomplete Schatzki's ring, distal esophageal erosions/esophagitis(mild), small HH, NEGATIVE h pylori  . US ECHOCARDIOGRAPHY  03/19/2012   RV mildly dilated,RA mod. dilated,strial septum aneurysmal,Mod. MR & TR   OB History  Gravida Para Term Preterm AB Living  1 1       1   SAB TAB Ectopic Multiple Live Births               # Outcome Date GA Lbr Len/2nd Weight Sex Delivery Anes PTL Lv  1 Para             Patient denies any other pertinent gynecologic issues.   Current Outpatient Prescriptions on File Prior to Visit  Medication Sig Dispense Refill  . ALPRAZolam  (XANAX) 1 MG tablet Take 0.5 tablets (0.5 mg total) by mouth 2 (two) times daily. 60 tablet 5  . dalfampridine 10 MG TB12 Take 1 tablet (10 mg total) by mouth 2 (two) times daily. 60 tablet 5  . ibuprofen (ADVIL,MOTRIN) 200 MG tablet Take 600 mg by mouth every 6 (six) hours as needed for mild pain or moderate pain.    . Peginterferon Beta-1a 125 MCG/0.5ML SOPN Inject 125 mcg into the skin every 14 (fourteen) days. 6 pen 3  . ranitidine (ZANTAC) 150 MG tablet Take 150 mg by mouth 2 (two) times daily.     No current facility-administered medications on file prior to visit.    Allergies  Allergen Reactions  . Propoxyphene N-Acetaminophen Rash    Social History:   reports that she quit smoking about 4 years ago. Her smoking use included Cigarettes. She has a 20.00 pack-year smoking history. She has never used smokeless tobacco. She reports that she does not drink alcohol or use drugs.  Family History  Problem Relation Age of Onset  . Other Mother     lost leg, has trouble swallowing  . Thyroid nodules Mother   . Heart attack Father   . Coronary artery disease Father   . Diabetes Father   . Rheum arthritis Father   . Colon cancer Paternal Uncle 50  . Colon cancer Paternal Grandmother 34  . Diabetes Paternal Grandmother   .  Cancer Paternal Grandmother     breast,lung  . Cancer Maternal Grandmother 69    kind unknown  . Diabetes Maternal Grandmother   . Myasthenia gravis Maternal Grandfather   . Other Sister     tumor on ovary  . ADD / ADHD Son   . Heart attack Paternal Grandfather   . Multiple sclerosis Other     paternal great aunt    Review of Systems: Noncontributory  PHYSICAL EXAM: Blood pressure 108/74, pulse 76, height 5\' 8"  (1.727 m), weight 268 lb (121.6 kg), last menstrual period 06/11/2016. General appearance - alert, well appearing, and in no distress Pt declined exam at this time; will perform next week .   Labs: No results found for this or any previous visit  (from the past 336 hour(s)).  Imaging Studies: US Transvaginal Non-ob  Result Date: 06/05/2016 GYNECOLOGIC SONOGRAM [name] is a 35 y.o. G1P1 LMP 05/25/2016 for a pelvic sonogram to f/u complex left ov cyst. Uterus                      9.6 x 5.4 x 5.3 cm, anteverted uterus w/ a post intramural fibroid 2.1 x 1.2 x 1.7 cm Endometrium          11.9 mm, symmetrical, wnl Right ovary             3.4 x 3.8 x 2.9 cm, wnl Left ovary                5.1 x 5.3 x 5.3 cm,  complex left ovarian cyst w/ low level echoes 5 x 4.9 x 4.5 cm (slightly larger) No free fluid,no pain during ultrasound,ov's appear to be mobile Technician Comments: PELVIC US TA/TV: Anteverted uterus w/ a post intramural fibroid 2.1 x 1.2 x 1.7 cm,normal right ov, complex left ovarian cyst w/ low level echoes 5 x 4.9 x 4.5 cm (slightly larger),EEC 11.9 mm,no free fluid,no pain during ultrasound,ov's appear to be mobile [employer] 06/05/2016 10:04 AM   US Pelvis Complete  Result Date: 06/05/2016 GYNECOLOGIC SONOGRAM [name] is a 35 y.o. G1P1 LMP 05/25/2016 for a pelvic sonogram to f/u complex left ov cyst. Uterus                      9.6 x 5.4 x 5.3 cm, anteverted uterus w/ a post intramural fibroid 2.1 x 1.2 x 1.7 cm Endometrium          11.9 mm, symmetrical, wnl Right ovary             3.4 x 3.8 x 2.9 cm, wnl Left ovary                5.1 x 5.3 x 5.3 cm,  complex left ovarian cyst w/ low level echoes 5 x 4.9 x 4.5 cm (slightly larger) No free fluid,no pain during ultrasound,ov's appear to be mobile Technician Comments: PELVIC US TA/TV: Anteverted uterus w/ a post intramural fibroid 2.1 x 1.2 x 1.7 cm,normal right ov, complex left ovarian cyst w/ low level echoes 5 x 4.9 x 4.5 cm (slightly larger),EEC 11.9 mm,no free fluid,no pain during ultrasound,ov's appear to be mobile [employer] 06/05/2016 10:04 AM    Assessment: Patient Active Problem List   Diagnosis Date Noted  . Fibroid 04/24/2016  . Ovarian cyst 04/24/2016  .  Menorrhagia with irregular cycle 04/13/2016  . Frontal sinusitis 09/30/2015  . Photosensitivity 03/30/2015  . Vertigo,  aural 03/30/2015  . Morbid obesity ([place]) 10/19/2014  . Diplopia 10/19/2014  . MS (multiple sclerosis) ([place]) 06/17/2014  . Abnormality of gait 06/17/2014  . Other malaise and fatigue 10/20/2013  . Insomnia 08/06/2013  . Mitral insufficiency 05/17/2013  . Tricuspid insufficiency 05/17/2013  . Multiple sclerosis ([place]) 01/23/2013  . Severe obesity (BMI >= 40) ([place]) 01/23/2013  . 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 Depression with anxiety 01/23/2013  . Anemia 02/22/2012  . Hematochezia 02/22/2012  . Globus sensation 02/22/2012  . Dysphagia 02/22/2012  . GERD 09/01/2010  . CONSTIPATION, CHRONIC 09/01/2010   Discussed various methods of period regulation.  Plan: Patient will undergo surgical management with Endometrial Ablation. Patient to consider tubal ligation at time of endometrial ablation.  Pro and con of tubal sterilization reviewed, pt does not desire future childbearing.  By signing my name below, I, Evelene Croon, attest that this documentation has been prepared under the direction and in the presence of Jonnie Kind, MD . Electronically Signed: Evelene Croon, Scribe. 06/16/2016. 11:50 AM. I personally performed the services described in this documentation, which was SCRIBED in my presence. The recorded information has been reviewed and considered accurate. It has been edited as necessary during review. Jonnie Kind, MD

## 2016-06-19 ENCOUNTER — Telehealth: Payer: Self-pay | Admitting: Obstetrics and Gynecology

## 2016-06-20 NOTE — Patient Instructions (Signed)
CURISSA GALUSKA  06/20/2016     @PREFPERIOPPHARMACY @   Your procedure is scheduled on 06/27/2016.  Report to [facility] at 7:30 A.M.  Call this number if you have problems the morning of surgery:  [phone]   Remember:  Do not eat food or drink liquids after midnight.  Take these medicines the morning of surgery with A SIP OF WATER : Blanchard Kelch, NORCO AND DALFAMPRIDINE   Do not wear jewelry, make-up or nail polish.  Do not wear lotions, powders, or perfumes, or deoderant.  Do not shave 48 hours prior to surgery.  Men may shave face and neck.  Do not bring valuables to the hospital.  [facility] is not responsible for any belongings or valuables.  Contacts, dentures or bridgework may not be worn into surgery.  Leave your suitcase in the car.  After surgery it may be brought to your room.  For patients admitted to the hospital, discharge time will be determined by your treatment team.  Patients discharged the day of surgery will not be allowed to drive home.   Name and phone number of your driver:   FAMILY Special instructions:  N/A  Please read over the following fact sheets that you were given. Care and Recovery After Surgery    Endometrial Ablation Endometrial ablation removes the lining of the uterus (endometrium). It is usually a same-day, outpatient treatment. Ablation helps avoid major surgery, such as surgery to remove the cervix and uterus (hysterectomy). After endometrial ablation, you will have little or no menstrual bleeding and may not be able to have children. However, if you are premenopausal, you will need to use a reliable method of birth control following the procedure because of the small chance that pregnancy can occur. There are different reasons to have this procedure. These reasons include:  Heavy periods.  Bleeding that is causing anemia.  Irregular bleeding.  Bleeding fibroids on the lining inside the uterus if they are smaller than 3  centimeters. This procedure may not be possible for you if:   You want to have children in the future.   You have severe cramps with your menstrual period.   You have precancerous or cancerous cells in your uterus.   You were recently pregnant.   You have gone through menopause.   You have had major surgery on your uterus, resulting in thinning of the uterine wall. Surgeries may include:  The removal of one or more uterine fibroids (myomectomy).  A cesarean section with a classic (vertical) incision on your uterus. Ask your health care provider what type of cesarean you had. Sometimes the scar on your skin is different than the scar on your uterus. Even if you have had surgery on your uterus, certain types of ablation may still be safe for you. Talk with your health care provider. LET [facility] CARE PROVIDER KNOW ABOUT:  Any allergies you have.  All medicines you are taking, including vitamins, herbs, eye drops, creams, and over-the-counter medicines.  Previous problems you or members of your family have had with the use of anesthetics.  Any blood disorders you have.  Previous surgeries you have had.  Medical conditions you have. RISKS AND COMPLICATIONS  Generally, this is a safe procedure. However, as with any procedure, complications can occur. Possible complications include:  Perforation of the uterus.  Bleeding.  Infection of the uterus, bladder, or vagina.  Injury to surrounding organs.  An air bubble to the lung (air embolus).  Pregnancy  following the procedure.  Failure of the procedure to help the problem, requiring hysterectomy.  Decreased ability to diagnose cancer in the lining of the uterus. BEFORE THE PROCEDURE  The lining of the uterus must be tested to make sure there is no pre-cancerous or cancer cells present.  An ultrasound may be performed to look at the size of the uterus and to check for abnormalities.  Medicines may be given to thin  the lining of the uterus. PROCEDURE  During the procedure, your health care provider will use a tool called a resectoscope to help see inside your uterus. There are different ways to remove the lining of your uterus.   Radiofrequency - This method uses a radiofrequency-alternating electric current to remove the lining of the uterus.  Cryotherapy - This method uses extreme cold to freeze the lining of the uterus.  Heated-Free Liquid - This method uses heated salt (saline) solution to remove the lining of the uterus.  Microwave - This method uses high-energy microwaves to heat up the lining of the uterus to remove it.  Thermal balloon - This method involves inserting a catheter with a balloon tip into the uterus. The balloon tip is filled with heated fluid to remove the lining of the uterus. AFTER THE PROCEDURE  After your procedure, do not have sexual intercourse or insert anything into your vagina until permitted by your health care provider. After the procedure, you may experience:  Cramps.  Vaginal discharge.  Frequent urination.   This information is not intended to replace advice given to you by your health care provider. Make sure you discuss any questions you have with your health care provider.   Document Released: 08/18/2004 Document Revised: 06/30/2015 Document Reviewed: 03/12/2013 Elsevier Interactive Patient Education 2016 Elsevier [employer] Dilation and Curettage or Vacuum Curettage Dilation and curettage (D&C) and vacuum curettage are minor procedures. A D&C involves stretching (dilation) the cervix and scraping (curettage) the inside lining of the womb (uterus). During a D&C, tissue is gently scraped from the inside lining of the uterus. During a vacuum curettage, the lining and tissue in the uterus are removed with the use of gentle suction.  Curettage may be performed to either diagnose or treat a problem. As a diagnostic procedure, curettage is performed to examine tissues  from the uterus. A diagnostic curettage may be performed for the following symptoms:   Irregular bleeding in the uterus.   Bleeding with the development of clots.   Spotting between menstrual periods.   Prolonged menstrual periods.   Bleeding after menopause.   No menstrual period (amenorrhea).   A change in size and shape of the uterus.  As a treatment procedure, curettage may be performed for the following reasons:   Removal of an IUD (intrauterine device).   Removal of retained placenta after giving birth. Retained placenta can cause an infection or bleeding severe enough to require transfusions.   Abortion.   Miscarriage.   Removal of polyps inside the uterus.   Removal of uncommon types of noncancerous lumps (fibroids).  LET [facility] CARE PROVIDER KNOW ABOUT:   Any allergies you have.   All medicines you are taking, including vitamins, herbs, eye drops, creams, and over-the-counter medicines.   Previous problems you or members of your family have had with the use of anesthetics.   Any blood disorders you have.   Previous surgeries you have had.   Medical conditions you have. RISKS AND COMPLICATIONS  Generally, this is a safe procedure. However, as  with any procedure, complications can occur. Possible complications include:  Excessive bleeding.   Infection of the uterus.   Damage to the cervix.   Development of scar tissue (adhesions) inside the uterus, later causing abnormal amounts of menstrual bleeding.   Complications from the general anesthetic, if a general anesthetic is used.   Putting a hole (perforation) in the uterus. This is rare.  BEFORE THE PROCEDURE   Eat and drink before the procedure only as directed by your health care provider.   Arrange for someone to take you home.  PROCEDURE  This procedure usually takes about 15-30 minutes.  You will be given one of the following:  A medicine that numbs the area in  and around the cervix (local anesthetic).   A medicine to make you sleep through the procedure (general anesthetic).  You will lie on your back with your legs in stirrups.   A warm metal or plastic instrument (speculum) will be placed in your vagina to keep it open and to allow the health care provider to see the cervix.  There are two ways in which your cervix can be softened and dilated. These include:   Taking a medicine.   Having thin rods (laminaria) inserted into your cervix.   A curved tool (curette) will be used to scrape cells from the inside lining of the uterus. In some cases, gentle suction is applied with the curette. The curette will then be removed.  AFTER THE PROCEDURE   You will rest in the recovery area until you are stable and are ready to go home.   You may feel sick to your stomach (nauseous) or throw up (vomit) if you were given a general anesthetic.   You may have a sore throat if a tube was placed in your throat during general anesthesia.   You may have light cramping and bleeding. This may last for 2 days to 2 weeks after the procedure.   Your uterus needs to make a new lining after the procedure. This may make your next period late.   This information is not intended to replace advice given to you by your health care provider. Make sure you discuss any questions you have with your health care provider.   Document Released: 10/09/2005 Document Revised: 06/11/2013 Document Reviewed: 05/08/2013 Elsevier Interactive Patient Education [employer].

## 2016-06-21 ENCOUNTER — Encounter: Payer: Self-pay | Admitting: Obstetrics and Gynecology

## 2016-06-21 ENCOUNTER — Other Ambulatory Visit: Payer: Self-pay | Admitting: Obstetrics and Gynecology

## 2016-06-21 ENCOUNTER — Ambulatory Visit (INDEPENDENT_AMBULATORY_CARE_PROVIDER_SITE_OTHER): Payer: Medicare Other | Admitting: Obstetrics and Gynecology

## 2016-06-21 VITALS — BP 130/88 | HR 75 | Ht 69.0 in | Wt 269.0 lb

## 2016-06-21 DIAGNOSIS — Z302 Encounter for sterilization: Secondary | ICD-10-CM

## 2016-06-21 DIAGNOSIS — N938 Other specified abnormal uterine and vaginal bleeding: Secondary | ICD-10-CM | POA: Diagnosis not present

## 2016-06-21 DIAGNOSIS — Z3009 Encounter for other general counseling and advice on contraception: Secondary | ICD-10-CM

## 2016-06-21 DIAGNOSIS — Z01818 Encounter for other preprocedural examination: Secondary | ICD-10-CM

## 2016-06-21 DIAGNOSIS — N921 Excessive and frequent menstruation with irregular cycle: Secondary | ICD-10-CM | POA: Diagnosis not present

## 2016-06-21 DIAGNOSIS — N920 Excessive and frequent menstruation with regular cycle: Secondary | ICD-10-CM | POA: Insufficient documentation

## 2016-06-21 NOTE — Progress Notes (Signed)
Preoperative History and Physical  [name] is a 35 y.o. G1P1 here for surgical management of menorrhagia x today. Patient's last menstrual period was 06/11/2016. No significant preoperative concerns. Pt voices that she doesn't desire any more children at this time. Pt denies any other symptoms.   Proposed surgery: endometrial ablation and bilateral tubal ligation  Past Medical History:  Diagnosis Date  . Fibroid 04/24/2016  . GERD (gastroesophageal reflux disease)   . Hypertension   . Menorrhagia with irregular cycle 04/13/2016  . Multiple sclerosis ([place])    Dr [name]  . Multiple sclerosis exacerbation ([place])   . Numbness in right leg 02/13,12/12  . Obesity (BMI 35.0-39.9 without comorbidity) ([place])   . Other malaise and fatigue 10/20/2013  . Ovarian cyst 04/24/2016  . Regurgitation    cardiac valve. echo 2013 for first eval [name],mod MR,TR   Past Surgical History:  Procedure Laterality Date  . BACK SURGERY  2006  . CESAREAN SECTION  2000  . CHOLECYSTECTOMY  2010  . COLONOSCOPY  09/14/2010   Fields-hyperplastic polyps, int hemorrhoids  . CYST EXCISION  2006   pilonoidal  . ESOPHAGOGASTRODUODENOSCOPY  08/05/2008   Fields-incomplete Schatzki's ring, distal esophageal erosions/esophagitis(mild), small HH, NEGATIVE h pylori  . US ECHOCARDIOGRAPHY  03/19/2012   RV mildly dilated,RA mod. dilated,strial septum aneurysmal,Mod. MR & TR   OB History  Gravida Para Term Preterm AB Living  1 1       1   SAB TAB Ectopic Multiple Live Births               # Outcome Date GA Lbr Len/2nd Weight Sex Delivery Anes PTL Lv  1 Para             Patient denies any other pertinent gynecologic issues.   Current Outpatient Prescriptions on File Prior to Visit  Medication Sig Dispense Refill  . ALPRAZolam (XANAX) 1 MG tablet Take 0.5 tablets (0.5 mg total) by mouth 2 (two) times daily. 60 tablet 5  . dalfampridine 10 MG TB12 Take 1 tablet (10 mg total) by mouth 2 (two) times daily. 60  tablet 5  . HYDROcodone-acetaminophen (NORCO/VICODIN) 5-325 MG tablet Take 1 tablet by mouth every 6 (six) hours as needed for moderate pain.    Marland Kitchen ibuprofen (ADVIL,MOTRIN) 200 MG tablet Take 600 mg by mouth every 6 (six) hours as needed for mild pain or moderate pain.    . Peginterferon Beta-1a 125 MCG/0.5ML SOPN Inject 125 mcg into the skin every 14 (fourteen) days. 6 pen 3  . ranitidine (ZANTAC) 150 MG tablet Take 150 mg by mouth 2 (two) times daily.    Marland Kitchen sulfamethoxazole-trimethoprim (BACTRIM DS,SEPTRA DS) 800-160 MG tablet Take 1 tablet by mouth 2 (two) times daily.     No current facility-administered medications on file prior to visit.    Allergies  Allergen Reactions  . Propoxyphene N-Acetaminophen Rash    Social History:   reports that she quit smoking about 4 years ago. Her smoking use included Cigarettes. She has a 20.00 pack-year smoking history. She has never used smokeless tobacco. She reports that she does not drink alcohol or use drugs.  Family History  Problem Relation Age of Onset  . Other Mother     lost leg, has trouble swallowing  . Thyroid nodules Mother   . Heart attack Father   . Coronary artery disease Father   . Diabetes Father   . Rheum arthritis Father   . Colon cancer Paternal Uncle 14  .  Colon cancer Paternal Grandmother 43  . Diabetes Paternal Grandmother   . Cancer Paternal Grandmother     breast,lung  . Cancer Maternal Grandmother 60    kind unknown  . Diabetes Maternal Grandmother   . Myasthenia gravis Maternal Grandfather   . Other Sister     tumor on ovary  . ADD / ADHD Son   . Heart attack Paternal Grandfather   . Multiple sclerosis Other     paternal great aunt    Review of Systems: Noncontributory  PHYSICAL EXAM: Last menstrual period 06/11/2016. General appearance - alert, well appearing, and in no distress Chest - clear to auscultation, no wheezes, rales or rhonchi, symmetric air entry Heart - normal rate and regular  rhythm Abdomen - soft, nontender, nondistended, no masses or organomegaly                     Minimal bruising from recent medication injection Pelvic - normal external genitalia, vulva, vagina, cervix, uterus and adnexa, VULVA: normal appearing vulva with no masses, tenderness or lesions,  VAGINA: normal appearing vagina with normal color and discharge, no lesions,  CERVIX: normal appearing cervix without discharge or lesions, multiparous os, UTERUS: uterus is normal size, shape, consistency and nontender, mobile, anterior, no masses ADNEXA: normal adnexa in size, nontender and no masses  Extremities - peripheral pulses normal, no pedal edema, no clubbing or cyanosis  Labs: No results found for this or any previous visit (from the past 336 hour(s)).  Imaging Studies: US Transvaginal Non-ob  Result Date: 06/19/2016 GYNECOLOGIC SONOGRAM [name] is a 35 y.o. G1P1 LMP 05/25/2016 for a pelvic sonogram to f/u complex left ov cyst. Uterus                      9.6 x 5.4 x 5.3 cm, anteverted uterus w/ a post intramural fibroid 2.1 x 1.2 x 1.7 cm Endometrium          11.9 mm, symmetrical, wnl Right ovary             3.4 x 3.8 x 2.9 cm, wnl Left ovary                5.1 x 5.3 x 5.3 cm,  complex left ovarian cyst w/ low level echoes 5 x 4.9 x 4.5 cm (slightly larger) No free fluid,no pain during ultrasound,ov's appear to be mobile Technician Comments: PELVIC US TA/TV: Anteverted uterus w/ a post intramural fibroid 2.1 x 1.2 x 1.7 cm,normal right ov, complex left ovarian cyst w/ low level echoes 5 x 4.9 x 4.5 cm (slightly larger),EEC 11.9 mm,no free fluid,no pain during ultrasound,ov's appear to be mobile [employer] 06/05/2016 10:04 AM Clinical Impression and recommendations: I have reviewed the sonogram results above. Combined with the patient's current clinical course, below are my impressions and any appropriate recommendations for management based on the sonographic findings: 1. Persistent left  adnexal cyst, likely ovarian, with low level echoes, no solid component, no internal or external excresences. Benign nature of cyst considered most likely. 2. Intramural fibroid stable, not affecting endometrium. 3. Consideration of removal of cyst at time of ablation to be discussed with the patient. Jermaine Neuharth V   US Pelvis Complete  Result Date: 06/19/2016 GYNECOLOGIC SONOGRAM DEMONICA ANNON is a 35 y.o. G1P1 LMP 05/25/2016 for a pelvic sonogram to f/u complex left ov cyst. Uterus  9.6 x 5.4 x 5.3 cm, anteverted uterus w/ a post intramural fibroid 2.1 x 1.2 x 1.7 cm Endometrium          11.9 mm, symmetrical, wnl Right ovary             3.4 x 3.8 x 2.9 cm, wnl Left ovary                5.1 x 5.3 x 5.3 cm,  complex left ovarian cyst w/ low level echoes 5 x 4.9 x 4.5 cm (slightly larger) No free fluid,no pain during ultrasound,ov's appear to be Scientist, product/process development Comments: PELVIC US TA/TV: Anteverted uterus w/ a post intramural fibroid 2.1 x 1.2 x 1.7 cm,normal right ov, complex left ovarian cyst w/ low level echoes 5 x 4.9 x 4.5 cm (slightly larger),EEC 11.9 mm,no free fluid,no pain during ultrasound,ov's appear to be mobile [employer] 06/05/2016 10:04 AM Clinical Impression and recommendations: I have reviewed the sonogram results above. Combined with the patient's current clinical course, below are my impressions and any appropriate recommendations for management based on the sonographic findings: 1. Persistent left adnexal cyst, likely ovarian, with low level echoes, no solid component, no internal or external excresences. Benign nature of cyst considered most likely. 2. Intramural fibroid stable, not affecting endometrium. 3. Consideration of removal of cyst at time of ablation to be discussed with the patient. Freida Nebel V    Assessment: Patient Active Problem List   Diagnosis Date Noted  . Fibroid 04/24/2016  . Ovarian cyst 04/24/2016  . Menorrhagia with irregular cycle  04/13/2016  . Frontal sinusitis 09/30/2015  . Photosensitivity 03/30/2015  . Vertigo, aural 03/30/2015  . Morbid obesity ([place]) 10/19/2014  . Diplopia 10/19/2014  . MS (multiple sclerosis) ([place]) 06/17/2014  . Abnormality of gait 06/17/2014  . Other malaise and fatigue 10/20/2013  . Insomnia 08/06/2013  . Mitral insufficiency 05/17/2013  . Tricuspid insufficiency 05/17/2013  . Multiple sclerosis ([place]) 01/23/2013  . Severe obesity (BMI >= 40) ([place]) 01/23/2013  . 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 Depression with anxiety 01/23/2013  . Anemia 02/22/2012  . Hematochezia 02/22/2012  . Globus sensation 02/22/2012  . Dysphagia 02/22/2012  . GERD 09/01/2010  . CONSTIPATION, CHRONIC 09/01/2010    Plan: 1. 2.5 mg Provera Rx daily for use prior to surgical management, to suppress endometrium. As pt under age 75 with regular menses will not require preAblation endometrial sampling. 2. Patient will undergo surgical management with endometrial ablation and tubal ligation, to be scheduled for late September.   By signing my name below, I, Soijett Blue, attest that this documentation has been prepared under the direction and in the presence of Jonnie Kind, MD. Electronically Signed: Soijett Blue, ED Scribe. 06/21/16. 11:49 AM.  I personally performed the services described in this documentation, which was SCRIBED in my presence. The recorded information has been reviewed and considered accurate. It has been edited as necessary during review. Jonnie Kind, MD

## 2016-06-21 NOTE — Telephone Encounter (Signed)
Pt's u/s of the persistent ovarian cyst reviewed, will remove the cyst at time of tubal ligation

## 2016-06-21 NOTE — H&P (Signed)
Expand All Collapse All   [] Hide copied text Preoperative History and Physical  [name] is a 35 y.o. G1P1 here for surgical management of menorrhagia. Pt reports ongoing episodes of vaginal bleeding x 6 days. She reports anemia, lightheadedness, and states she has been having periods twice a months for the last 6 months. No significant preoperative concerns. Pt had [place] on 06/05/16 that showed Anteverted uterus w/ a post intramural fibroid, normal right ovary, complex left ovarian cyst. Pt is not sexually active and has no desire for more children.  Proposed surgery: Endometrial Ablation      Past Medical History:  Diagnosis Date  . Fibroid 04/24/2016  . GERD (gastroesophageal reflux disease)   . Hypertension   . Menorrhagia with irregular cycle 04/13/2016  . Multiple sclerosis ([place])    Dr Lonzo Cloud  . Multiple sclerosis exacerbation ([place])   . Numbness in right leg 02/13,12/12  . Obesity (BMI 35.0-39.9 without comorbidity) ([place])   . Other malaise and fatigue 10/20/2013  . Ovarian cyst 04/24/2016  . Regurgitation    cardiac valve. echo 2013 for first eval GILENYA,mod MR,TR        Past Surgical History:  Procedure Laterality Date  . BACK SURGERY  2006  . CESAREAN SECTION  2000  . CHOLECYSTECTOMY  2010  . COLONOSCOPY  09/14/2010   Fields-hyperplastic polyps, int hemorrhoids  . CYST EXCISION  2006   pilonoidal  . ESOPHAGOGASTRODUODENOSCOPY  08/05/2008   Fields-incomplete Schatzki's ring, distal esophageal erosions/esophagitis(mild), small HH, NEGATIVE h pylori  . US ECHOCARDIOGRAPHY  03/19/2012   RV mildly dilated,RA mod. dilated,strial septum aneurysmal,Mod. MR & TR                   OB History  Gravida Para Term Preterm AB Living  1 1       1   SAB TAB Ectopic Multiple Live Births               # Outcome Date GA Lbr Len/2nd Weight Sex Delivery Anes PTL Lv  1 Para             Patient denies any other pertinent gynecologic issues.           Current Outpatient Prescriptions on File Prior to Visit  Medication Sig Dispense Refill  . ALPRAZolam (XANAX) 1 MG tablet Take 0.5 tablets (0.5 mg total) by mouth 2 (two) times daily. 60 tablet 5  . dalfampridine 10 MG TB12 Take 1 tablet (10 mg total) by mouth 2 (two) times daily. 60 tablet 5  . ibuprofen (ADVIL,MOTRIN) 200 MG tablet Take 600 mg by mouth every 6 (six) hours as needed for mild pain or moderate pain.    . Peginterferon Beta-1a 125 MCG/0.5ML SOPN Inject 125 mcg into the skin every 14 (fourteen) days. 6 pen 3  . ranitidine (ZANTAC) 150 MG tablet Take 150 mg by mouth 2 (two) times daily.     No current facility-administered medications on file prior to visit.        Allergies  Allergen Reactions  . Propoxyphene N-Acetaminophen Rash    Social History:   reports that she quit smoking about 4 years ago. Her smoking use included Cigarettes. She has a 20.00 pack-year smoking history. She has never used smokeless tobacco. She reports that she does not drink alcohol or use drugs.        Family History  Problem Relation Age of Onset  . Other Mother     lost leg, has trouble  swallowing  . Thyroid nodules Mother   . Heart attack Father   . Coronary artery disease Father   . Diabetes Father   . Rheum arthritis Father   . Colon cancer Paternal Uncle 44  . Colon cancer Paternal Grandmother 36  . Diabetes Paternal Grandmother   . Cancer Paternal Grandmother     breast,lung  . Cancer Maternal Grandmother 38    kind unknown  . Diabetes Maternal Grandmother   . Myasthenia gravis Maternal Grandfather   . Other Sister     tumor on ovary  . ADD / ADHD Son   . Heart attack Paternal Grandfather   . Multiple sclerosis Other     paternal great aunt    Review of Systems: Noncontributory  PHYSICAL EXAM: Blood pressure 108/74, pulse 76, height 5\' 8"  (1.727 m), weight 268 lb (121.6 kg), last menstrual period 06/11/2016. General  appearance - alert, well appearing, and in no distress Pt declined exam at this time; will perform next week .   Labs: No results found for this or any previous visit (from the past 336 hour(s)).  Imaging Studies:  ImagingResults  US Transvaginal Non-ob  Result Date: 06/05/2016 GYNECOLOGIC SONOGRAM [name] is a 35 y.o. G1P1 LMP 05/25/2016 for a pelvic sonogram to f/u complex left ov cyst. Uterus                      9.6 x 5.4 x 5.3 cm, anteverted uterus w/ a post intramural fibroid 2.1 x 1.2 x 1.7 cm Endometrium          11.9 mm, symmetrical, wnl Right ovary             3.4 x 3.8 x 2.9 cm, wnl Left ovary                5.1 x 5.3 x 5.3 cm,  complex left ovarian cyst w/ low level echoes 5 x 4.9 x 4.5 cm (slightly larger) No free fluid,no pain during ultrasound,ov's appear to be mobile Technician Comments: PELVIC US TA/TV: Anteverted uterus w/ a post intramural fibroid 2.1 x 1.2 x 1.7 cm,normal right ov, complex left ovarian cyst w/ low level echoes 5 x 4.9 x 4.5 cm (slightly larger),EEC 11.9 mm,no free fluid,no pain during ultrasound,ov's appear to be mobile [employer] 06/05/2016 10:04 AM   US Pelvis Complete  Result Date: 06/05/2016 GYNECOLOGIC SONOGRAM [name] is a 35 y.o. G1P1 LMP 05/25/2016 for a pelvic sonogram to f/u complex left ov cyst. Uterus                      9.6 x 5.4 x 5.3 cm, anteverted uterus w/ a post intramural fibroid 2.1 x 1.2 x 1.7 cm Endometrium          11.9 mm, symmetrical, wnl Right ovary             3.4 x 3.8 x 2.9 cm, wnl Left ovary                5.1 x 5.3 x 5.3 cm,  complex left ovarian cyst w/ low level echoes 5 x 4.9 x 4.5 cm (slightly larger) No free fluid,no pain during ultrasound,ov's appear to be mobile Technician Comments: PELVIC US TA/TV: Anteverted uterus w/ a post intramural fibroid 2.1 x 1.2 x 1.7 cm,normal right ov, complex left ovarian cyst w/ low level echoes 5 x 4.9 x 4.5 cm (slightly larger),EEC 11.9 mm,no free  fluid,no pain during  ultrasound,ov's appear to be mobile [employer] 06/05/2016 10:04 AM     Assessment:     Patient Active Problem List   Diagnosis Date Noted  . Fibroid 04/24/2016  . Ovarian cyst 04/24/2016  . Menorrhagia with irregular cycle 04/13/2016  . Frontal sinusitis 09/30/2015  . Photosensitivity 03/30/2015  . Vertigo, aural 03/30/2015  . Morbid obesity ([place]) 10/19/2014  . Diplopia 10/19/2014  . MS (multiple sclerosis) ([place]) 06/17/2014  . Abnormality of gait 06/17/2014  . Other malaise and fatigue 10/20/2013  . Insomnia 08/06/2013  . Mitral insufficiency 05/17/2013  . Tricuspid insufficiency 05/17/2013  . Multiple sclerosis ([place]) 01/23/2013  . Severe obesity (BMI >= 40) ([place]) 01/23/2013  . 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 Depression with anxiety 01/23/2013  . Anemia 02/22/2012  . Hematochezia 02/22/2012  . Globus sensation 02/22/2012  . Dysphagia 02/22/2012  . GERD 09/01/2010  . CONSTIPATION, CHRONIC 09/01/2010   Discussed various methods of period regulation.  Plan: Patient will undergo surgical management with Endometrial Ablation.   By signing my name below, I, Evelene Croon, attest that this documentation has been prepared under the direction and in the presence of Jonnie Kind, MD . Electronically Signed: Evelene Croon, Scribe. 06/16/2016. 11:50 AM. I personally performed the services described in this documentation, which was SCRIBED in my presence. The recorded information has been reviewed and considered accurate. It has been edited as necessary during review. Jonnie Kind, MD

## 2016-06-22 ENCOUNTER — Encounter (HOSPITAL_COMMUNITY)
Admission: RE | Admit: 2016-06-22 | Discharge: 2016-06-22 | Disposition: A | Discharge status: Home / Self Care | Payer: Medicare Other | Source: Ambulatory Visit | Attending: Obstetrics and Gynecology | Admitting: Obstetrics and Gynecology

## 2016-06-22 DIAGNOSIS — S91222D Laceration with foreign body of left great toe with damage to nail, subsequent encounter: Secondary | ICD-10-CM | POA: Diagnosis not present

## 2016-07-05 ENCOUNTER — Encounter: Payer: Medicare Other | Admitting: Obstetrics and Gynecology

## 2016-07-14 NOTE — Patient Instructions (Signed)
DANIKKA WOLOSZYK  07/14/2016     @PREFPERIOPPHARMACY @   Your procedure is scheduled on  07/21/2016   Report to [facility] at  53  A.M.  Call this number if you have problems the morning of surgery:  [phone]   Remember:  Do not eat food or drink liquids after midnight.  Take these medicines the morning of surgery with A SIP OF WATER xanax, dalfampridine,hydrocodone, zantac.   Do not wear jewelry, make-up or nail polish.  Do not wear lotions, powders, or perfumes, or deoderant.  Do not shave 48 hours prior to surgery.  Men may shave face and neck.  Do not bring valuables to the hospital.  [facility] is not responsible for any belongings or valuables.  Contacts, dentures or bridgework may not be worn into surgery.  Leave your suitcase in the car.  After surgery it may be brought to your room.  For patients admitted to the hospital, discharge time will be determined by your treatment team.  Patients discharged the day of surgery will not be allowed to drive home.   Name and phone number of your driver:   family Special instructions:  none  Please read over the following fact sheets that you were given. Anesthesia Post-op Instructions and Care and Recovery After Surgery       Endometrial Ablation Endometrial ablation removes the lining of the uterus (endometrium). It is usually a same-day, outpatient treatment. Ablation helps avoid major surgery, such as surgery to remove the cervix and uterus (hysterectomy). After endometrial ablation, you will have little or no menstrual bleeding and may not be able to have children. However, if you are premenopausal, you will need to use a reliable method of birth control following the procedure because of the small chance that pregnancy can occur. There are different reasons to have this procedure. These reasons include:  Heavy periods.  Bleeding that is causing anemia.  Irregular  bleeding.  Bleeding fibroids on the lining inside the uterus if they are smaller than 3 centimeters. This procedure may not be possible for you if:   You want to have children in the future.   You have severe cramps with your menstrual period.   You have precancerous or cancerous cells in your uterus.   You were recently pregnant.   You have gone through menopause.   You have had major surgery on your uterus, resulting in thinning of the uterine wall. Surgeries may include:  The removal of one or more uterine fibroids (myomectomy).  A cesarean section with a classic (vertical) incision on your uterus. Ask your health care provider what type of cesarean you had. Sometimes the scar on your skin is different than the scar on your uterus. Even if you have had surgery on your uterus, certain types of ablation may still be safe for you. Talk with your health care provider. LET [facility] CARE PROVIDER KNOW ABOUT:  Any allergies you have.  All medicines you are taking, including vitamins, herbs, eye drops, creams, and over-the-counter medicines.  Previous problems you or members of your family have had with the use of anesthetics.  Any blood disorders you have.  Previous surgeries you have had.  Medical conditions you have. RISKS AND COMPLICATIONS  Generally, this is a safe procedure. However, as with any procedure, complications can occur. Possible complications include:  Perforation of the uterus.  Bleeding.  Infection of the uterus, bladder, or vagina.  Injury to surrounding organs.  An air bubble to the lung (air embolus).  Pregnancy following the procedure.  Failure of the procedure to help the problem, requiring hysterectomy.  Decreased ability to diagnose cancer in the lining of the uterus. BEFORE THE PROCEDURE  The lining of the uterus must be tested to make sure there is no pre-cancerous or cancer cells present.  An ultrasound may be performed to look  at the size of the uterus and to check for abnormalities.  Medicines may be given to thin the lining of the uterus. PROCEDURE  During the procedure, your health care provider will use a tool called a resectoscope to help see inside your uterus. There are different ways to remove the lining of your uterus.   Radiofrequency - This method uses a radiofrequency-alternating electric current to remove the lining of the uterus.  Cryotherapy - This method uses extreme cold to freeze the lining of the uterus.  Heated-Free Liquid - This method uses heated salt (saline) solution to remove the lining of the uterus.  Microwave - This method uses high-energy microwaves to heat up the lining of the uterus to remove it.  Thermal balloon - This method involves inserting a catheter with a balloon tip into the uterus. The balloon tip is filled with heated fluid to remove the lining of the uterus. AFTER THE PROCEDURE  After your procedure, do not have sexual intercourse or insert anything into your vagina until permitted by your health care provider. After the procedure, you may experience:  Cramps.  Vaginal discharge.  Frequent urination.   This information is not intended to replace advice given to you by your health care provider. Make sure you discuss any questions you have with your health care provider.   Document Released: 08/18/2004 Document Revised: 06/30/2015 Document Reviewed: 03/12/2013 Elsevier Interactive Patient Education [employer]. Hysteroscopy Hysteroscopy is a procedure used for looking inside the womb (uterus). It may be done for various reasons, including:  To evaluate abnormal bleeding, fibroid (benign, noncancerous) tumors, polyps, scar tissue (adhesions), and possibly cancer of the uterus.  To look for lumps (tumors) and other uterine growths.  To look for causes of why a woman cannot get pregnant (infertility), causes of recurrent loss of pregnancy (miscarriages), or a  lost intrauterine device (IUD).  To perform a sterilization by blocking the fallopian tubes from inside the uterus. In this procedure, a thin, flexible tube with a tiny light and camera on the end of it (hysteroscope) is used to look inside the uterus. A hysteroscopy should be done right after a menstrual period to be sure you are not pregnant. LET [facility] CARE PROVIDER KNOW ABOUT:   Any allergies you have.  All medicines you are taking, including vitamins, herbs, eye drops, creams, and over-the-counter medicines.  Previous problems you or members of your family have had with the use of anesthetics.  Any blood disorders you have.  Previous surgeries you have had.  Medical conditions you have. RISKS AND COMPLICATIONS  Generally, this is a safe procedure. However, as with any procedure, complications can occur. Possible complications include:  Putting a hole in the uterus.  Excessive bleeding.  Infection.  Damage to the cervix.  Injury to other organs.  Allergic reaction to medicines.  Too much fluid used in the uterus for the procedure. BEFORE THE PROCEDURE   Ask your health care provider about changing or stopping any regular medicines.  Do not  take aspirin or blood thinners for 1 week before the procedure, or as directed by your health care provider. These can cause bleeding.  If you smoke, do not smoke for 2 weeks before the procedure.  In some cases, a medicine is placed in the cervix the day before the procedure. This medicine makes the cervix have a larger opening (dilate). This makes it easier for the instrument to be inserted into the uterus during the procedure.  Do not eat or drink anything for at least 8 hours before the surgery.  Arrange for someone to take you home after the procedure. PROCEDURE   You may be given a medicine to relax you (sedative). You may also be given one of the following:  A medicine that numbs the area around the cervix (local  anesthetic).  A medicine that makes you sleep through the procedure (general anesthetic).  The hysteroscope is inserted through the vagina into the uterus. The camera on the hysteroscope sends a picture to a TV screen. This gives the surgeon a good view inside the uterus.  During the procedure, air or a liquid is put into the uterus, which allows the surgeon to see better.  Sometimes, tissue is gently scraped from inside the uterus. These tissue samples are sent to a lab for testing. AFTER THE PROCEDURE   If you had a general anesthetic, you may be groggy for a couple hours after the procedure.  If you had a local anesthetic, you will be able to go home as soon as you are stable and feel ready.  You may have some cramping. This normally lasts for a couple days.  You may have bleeding, which varies from light spotting for a few days to menstrual-like bleeding for 3-7 days. This is normal.  If your test results are not back during the visit, make an appointment with your health care provider to find out the results.   This information is not intended to replace advice given to you by your health care provider. Make sure you discuss any questions you have with your health care provider.   Document Released: 01/15/2001 Document Revised: 07/30/2013 Document Reviewed: 05/08/2013 Elsevier Interactive Patient Education 2016 [place]. Hysteroscopy, Care After Refer to this sheet in the next few weeks. These instructions provide you with information on caring for yourself after your procedure. Your health care provider may also give you more specific instructions. Your treatment has been planned according to current medical practices, but problems sometimes occur. Call your health care provider if you have any problems or questions after your procedure.  WHAT TO EXPECT AFTER THE PROCEDURE After your procedure, it is typical to have the following:  You may have some cramping. This normally  lasts for a couple days.  You may have bleeding. This can vary from light spotting for a few days to menstrual-like bleeding for 3-7 days. HOME CARE INSTRUCTIONS  Rest for the first 1-2 days after the procedure.  Only take over-the-counter or prescription medicines as directed by your health care provider. Do not take aspirin. It can increase the chances of bleeding.  Take showers instead of baths for 2 weeks or as directed by your health care provider.  Do not drive for 24 hours or as directed.  Do not drink alcohol while taking pain medicine.  Do not use tampons, douche, or have sexual intercourse for 2 weeks or until your health care provider says it is okay.  Take your temperature twice a day for  4-5 days. Write it down each time.  Follow your health care provider's advice about diet, exercise, and lifting.  If you develop constipation, you may:  Take a mild laxative if your health care provider approves.  Add bran foods to your diet.  Drink enough fluids to keep your urine clear or pale yellow.  Try to have someone with you or available to you for the first 24-48 hours, especially if you were given a general anesthetic.  Follow up with your health care provider as directed. SEEK MEDICAL CARE IF:  You feel dizzy or lightheaded.  You feel sick to your stomach (nauseous).  You have abnormal vaginal discharge.  You have a rash.  You have pain that is not controlled with medicine. SEEK IMMEDIATE MEDICAL CARE IF:  You have bleeding that is heavier than a normal menstrual period.  You have a fever.  You have increasing cramps or pain, not controlled with medicine.  You have new belly (abdominal) pain.  You pass out.  You have pain in the tops of your shoulders (shoulder strap areas).  You have shortness of breath.   This information is not intended to replace advice given to you by your health care provider. Make sure you discuss any questions you have with  your health care provider.   Document Released: 07/30/2013 Document Reviewed: 07/30/2013 Elsevier Interactive Patient Education 2016 Elsevier Inc[employer] Dilation and Curettage or Vacuum Curettage Dilation and curettage (D&C) and vacuum curettage are minor procedures. A D&C involves stretching (dilation) the cervix and scraping (curettage) the inside lining of the womb (uterus). During a D&C, tissue is gently scraped from the inside lining of the uterus. During a vacuum curettage, the lining and tissue in the uterus are removed with the use of gentle suction.  Curettage may be performed to either diagnose or treat a problem. As a diagnostic procedure, curettage is performed to examine tissues from the uterus. A diagnostic curettage may be performed for the following symptoms:   Irregular bleeding in the uterus.   Bleeding with the development of clots.   Spotting between menstrual periods.   Prolonged menstrual periods.   Bleeding after menopause.   No menstrual period (amenorrhea).   A change in size and shape of the uterus.  As a treatment procedure, curettage may be performed for the following reasons:   Removal of an IUD (intrauterine device).   Removal of retained placenta after giving birth. Retained placenta can cause an infection or bleeding severe enough to require transfusions.   Abortion.   Miscarriage.   Removal of polyps inside the uterus.   Removal of uncommon types of noncancerous lumps (fibroids).  LET [facility] CARE PROVIDER KNOW ABOUT:   Any allergies you have.   All medicines you are taking, including vitamins, herbs, eye drops, creams, and over-the-counter medicines.   Previous problems you or members of your family have had with the use of anesthetics.   Any blood disorders you have.   Previous surgeries you have had.   Medical conditions you have. RISKS AND COMPLICATIONS  Generally, this is a safe procedure. However, as with any  procedure, complications can occur. Possible complications include:  Excessive bleeding.   Infection of the uterus.   Damage to the cervix.   Development of scar tissue (adhesions) inside the uterus, later causing abnormal amounts of menstrual bleeding.   Complications from the general anesthetic, if a general anesthetic is used.   Putting a hole (perforation) in the uterus. This is  rare.  BEFORE THE PROCEDURE   Eat and drink before the procedure only as directed by your health care provider.   Arrange for someone to take you home.  PROCEDURE  This procedure usually takes about 15-30 minutes.  You will be given one of the following:  A medicine that numbs the area in and around the cervix (local anesthetic).   A medicine to make you sleep through the procedure (general anesthetic).  You will lie on your back with your legs in stirrups.   A warm metal or plastic instrument (speculum) will be placed in your vagina to keep it open and to allow the health care provider to see the cervix.  There are two ways in which your cervix can be softened and dilated. These include:   Taking a medicine.   Having thin rods (laminaria) inserted into your cervix.   A curved tool (curette) will be used to scrape cells from the inside lining of the uterus. In some cases, gentle suction is applied with the curette. The curette will then be removed.  AFTER THE PROCEDURE   You will rest in the recovery area until you are stable and are ready to go home.   You may feel sick to your stomach (nauseous) or throw up (vomit) if you were given a general anesthetic.   You may have a sore throat if a tube was placed in your throat during general anesthesia.   You may have light cramping and bleeding. This may last for 2 days to 2 weeks after the procedure.   Your uterus needs to make a new lining after the procedure. This may make your next period late.   This information is not  intended to replace advice given to you by your health care provider. Make sure you discuss any questions you have with your health care provider.   Document Released: 10/09/2005 Document Revised: 06/11/2013 Document Reviewed: 05/08/2013 Elsevier Interactive Patient Education 2016 Else[employer] Inc[employer] Dilation and Curettage or Vacuum Curettage, Care After Refer to this sheet in the next few weeks. These instructions provide you with information on caring for yourself after your procedure. Your health care provider may also give you more specific instructions. Your treatment has been planned according to current medical practices, but problems sometimes occur. Call your health care provider if you have any problems or questions after your procedure. WHAT TO EXPECT AFTER THE PROCEDURE After your procedure, it is typical to have light cramping and bleeding. This may last for 2 days to 2 weeks after the procedure. HOME CARE INSTRUCTIONS   Do not drive for 24 hours.  Wait 1 week before returning to strenuous activities.  Take your temperature 2 times a day for 4 days and write it down. Provide these temperatures to your health care provider if you develop a fever.  Avoid long periods of standing.  Avoid heavy lifting, pushing, or pulling. Do not lift anything heavier than 10 pounds (4.5 kg).  Limit stair climbing to once or twice a day.  Take rest periods often.  You may resume your usual diet.  Drink enough fluids to keep your urine clear or pale yellow.  Your usual bowel function should return. If you have constipation, you may:  Take a mild laxative with permission from your health care provider.  Add fruit and bran to your diet.  Drink more fluids.  Take showers instead of baths until your health care provider gives you permission to take baths.  Do not  go swimming or use a hot tub until your health care provider approves.  Try to have someone with you or available to you the first  24-48 hours, especially if you were given a general anesthetic.  Do not douche, use tampons, or have sex (intercourse) for 2 weeks after the procedure.  Only take over-the-counter or prescription medicines as directed by your health care provider. Do not take aspirin. It can cause bleeding.  Follow up with your health care provider as directed. SEEK MEDICAL CARE IF:   You have increasing cramps or pain that is not relieved with medicine.  You have abdominal pain that does not seem to be related to the same area of earlier cramping and pain.  You have bad smelling vaginal discharge.  You have a rash.  You are having problems with any medicine. SEEK IMMEDIATE MEDICAL CARE IF:   You have bleeding that is heavier than a normal menstrual period.  You have a fever.  You have chest pain.  You have shortness of breath.  You feel dizzy or feel like fainting.  You pass out.  You have pain in your shoulder strap area.  You have heavy vaginal bleeding with or without blood clots. MAKE SURE YOU:   Understand these instructions.  Will watch your condition.  Will get help right away if you are not doing well or get worse.   This information is not intended to replace advice given to you by your health care provider. Make sure you discuss any questions you have with your health care provider.   Document Released: 10/06/2000 Document Revised: 10/14/2013 Document Reviewed: 05/08/2013 Elsevier Interactive Patient Education 2016 [place] Anesthesia, Adult General anesthesia is a sleep-like state of non-feeling produced by medicines (anesthetics). General anesthesia prevents you from being alert and feeling pain during a medical procedure. Your caregiver may recommend general anesthesia if your procedure:  Is long.  Is painful or uncomfortable.  Would be frightening to see or hear.  Requires you to be still.  Affects your breathing.  Causes significant blood  loss. LET YOUR CAREGIVER KNOW ABOUT:  Allergies to food or medicine.  Medicines taken, including vitamins, herbs, eyedrops, over-the-counter medicines, and creams.  Use of steroids (by mouth or creams).  Previous problems with anesthetics or numbing medicines, including problems experienced by relatives.  History of bleeding problems or blood clots.  Previous surgeries and types of anesthetics received.  Possibility of pregnancy, if this applies.  Use of cigarettes, alcohol, or illegal drugs.  Any health condition(s), especially diabetes, sleep apnea, and high blood pressure. RISKS AND COMPLICATIONS General anesthesia rarely causes complications. However, if complications do occur, they can be life threatening. Complications include:  A lung infection.  A stroke.  A heart attack.  Waking up during the procedure. When this occurs, the patient may be unable to move and communicate that he or she is awake. The patient may feel severe pain. Older adults and adults with serious medical problems are more likely to have complications than adults who are young and healthy. Some complications can be prevented by answering all of your caregiver's questions thoroughly and by following all pre-procedure instructions. It is important to tell your caregiver if any of the pre-procedure instructions, especially those related to diet, were not followed. Any food or liquid in the stomach can cause problems when you are under general anesthesia. BEFORE THE PROCEDURE  Ask your caregiver if you will have to spend the night at the hospital. If  you will not have to spend the night, arrange to have an adult drive you and stay with you for 24 hours.  Follow your caregiver's instructions if you are taking dietary supplements or medicines. Your caregiver may tell you to stop taking them or to reduce your dosage.  Do not smoke for as long as possible before your procedure. If possible, stop smoking 3-6  weeks before the procedure.  Do not take new dietary supplements or medicines within 1 week of your procedure unless your caregiver approves them.  Do not eat within 8 hours of your procedure or as directed by your caregiver. Drink only clear liquids, such as water, black coffee (without milk or cream), and fruit juices (without pulp).  Do not drink within 3 hours of your procedure or as directed by your caregiver.  You may brush your teeth on the morning of the procedure, but make sure to spit out the toothpaste and water when finished. PROCEDURE  You will receive anesthetics through a mask, through an intravenous (IV) access tube, or through both. A doctor who specializes in anesthesia (anesthesiologist) or a nurse who specializes in anesthesia (nurse anesthetist) or both will stay with you throughout the procedure to make sure you remain unconscious. He or she will also watch your blood pressure, pulse, and oxygen levels to make sure that the anesthetics do not cause any problems. Once you are asleep, a breathing tube or mask may be used to help you breathe. AFTER THE PROCEDURE You will wake up after the procedure is complete. You may be in the room where the procedure was performed or in a recovery area. You may have a sore throat if a breathing tube was used. You may also feel:  Dizzy.  Weak.  Drowsy.  Confused.  Nauseous.  Cold. These are all normal responses and can be expected to last for up to 24 hours after the procedure is complete. A caregiver will tell you when you are ready to go home. This will usually be when you are fully awake and in stable condition.   This information is not intended to replace advice given to you by your health care provider. Make sure you discuss any questions you have with your health care provider.   Document Released: 01/16/2008 Document Revised: 10/30/2014 Document Reviewed: 02/07/2012 Elsevier Interactive Patient Education 2016 [place] Anesthesia, Adult, Care After Refer to this sheet in the next few weeks. These instructions provide you with information on caring for yourself after your procedure. Your health care provider may also give you more specific instructions. Your treatment has been planned according to current medical practices, but problems sometimes occur. Call your health care provider if you have any problems or questions after your procedure. WHAT TO EXPECT AFTER THE PROCEDURE After the procedure, it is typical to experience:  Sleepiness.  Nausea and vomiting. HOME CARE INSTRUCTIONS  For the first 24 hours after general anesthesia:  Have a responsible person with you.  Do not drive a car. If you are alone, do not take public transportation.  Do not drink alcohol.  Do not take medicine that has not been prescribed by your health care provider.  Do not sign important papers or make important decisions.  You may resume a normal diet and activities as directed by your health care provider.  Change bandages (dressings) as directed.  If you have questions or problems that seem related to general anesthesia, call the hospital and ask for the anesthetist  or anesthesiologist on call. SEEK MEDICAL CARE IF:  You have nausea and vomiting that continue the day after anesthesia.  You develop a rash. SEEK IMMEDIATE MEDICAL CARE IF:   You have difficulty breathing.  You have chest pain.  You have any allergic problems.   This information is not intended to replace advice given to you by your health care provider. Make sure you discuss any questions you have with your health care provider.   Document Released: 01/15/2001 Document Revised: 10/30/2014 Document Reviewed: 02/07/2012 Elsevier Interactive Patient Education 2016 [employer] PATIENT INSTRUCTIONS POST-ANESTHESIA  IMMEDIATELY FOLLOWING SURGERY:  Do not drive or operate machinery for the first twenty four hours after surgery.  Do  not make any important decisions for twenty four hours after surgery or while taking narcotic pain medications or sedatives.  If you develop intractable nausea and vomiting or a severe headache please notify your doctor immediately.  FOLLOW-UP:  Please make an appointment with your surgeon as instructed. You do not need to follow up with anesthesia unless specifically instructed to do so.  WOUND CARE INSTRUCTIONS (if applicable):  Keep a dry clean dressing on the anesthesia/puncture wound site if there is drainage.  Once the wound has quit draining you may leave it open to air.  Generally you should leave the bandage intact for twenty four hours unless there is drainage.  If the epidural site drains for more than 36-48 hours please call the anesthesia department.  QUESTIONS?:  Please feel free to call your physician or the hospital operator if you have any questions, and they will be happy to assist you.

## 2016-07-17 ENCOUNTER — Encounter (HOSPITAL_COMMUNITY)
Admission: RE | Admit: 2016-07-17 | Discharge: 2016-07-17 | Disposition: A | Discharge status: Home / Self Care | Payer: Medicare Other | Source: Ambulatory Visit | Attending: Obstetrics and Gynecology | Admitting: Obstetrics and Gynecology

## 2016-07-17 ENCOUNTER — Encounter (HOSPITAL_COMMUNITY): Payer: Self-pay

## 2016-07-17 DIAGNOSIS — Z01812 Encounter for preprocedural laboratory examination: Secondary | ICD-10-CM | POA: Diagnosis not present

## 2016-07-17 DIAGNOSIS — N920 Excessive and frequent menstruation with regular cycle: Secondary | ICD-10-CM | POA: Diagnosis not present

## 2016-07-17 DIAGNOSIS — Z888 Allergy status to other drugs, medicaments and biological substances status: Secondary | ICD-10-CM | POA: Diagnosis not present

## 2016-07-17 DIAGNOSIS — K219 Gastro-esophageal reflux disease without esophagitis: Secondary | ICD-10-CM | POA: Diagnosis not present

## 2016-07-17 DIAGNOSIS — E669 Obesity, unspecified: Secondary | ICD-10-CM | POA: Insufficient documentation

## 2016-07-17 DIAGNOSIS — Z79899 Other long term (current) drug therapy: Secondary | ICD-10-CM | POA: Insufficient documentation

## 2016-07-17 DIAGNOSIS — G35 Multiple sclerosis: Secondary | ICD-10-CM | POA: Insufficient documentation

## 2016-07-17 DIAGNOSIS — Z9889 Other specified postprocedural states: Secondary | ICD-10-CM | POA: Diagnosis not present

## 2016-07-17 DIAGNOSIS — I10 Essential (primary) hypertension: Secondary | ICD-10-CM | POA: Diagnosis not present

## 2016-07-17 HISTORY — DX: Major depressive disorder, single episode, unspecified: F32.9

## 2016-07-17 HISTORY — DX: Endocarditis, valve unspecified: I38

## 2016-07-17 HISTORY — DX: Anxiety disorder, unspecified: F41.9

## 2016-07-17 HISTORY — DX: Cardiomegaly: I51.7

## 2016-07-17 HISTORY — DX: Depression, unspecified: F32.A

## 2016-07-17 LAB — URINE MICROSCOPIC-ADD ON

## 2016-07-17 LAB — CBC
HEMATOCRIT: 34.5 % — AB (ref 36.0–46.0)
Hemoglobin: 11.3 g/dL — ABNORMAL LOW (ref 12.0–15.0)
MCH: 25 pg — ABNORMAL LOW (ref 26.0–34.0)
MCHC: 32.8 g/dL (ref 30.0–36.0)
MCV: 76.3 fL — ABNORMAL LOW (ref 78.0–100.0)
Platelets: 275 10*3/uL (ref 150–400)
RBC: 4.52 MIL/uL (ref 3.87–5.11)
RDW: 15.2 % (ref 11.5–15.5)
WBC: 7.1 10*3/uL (ref 4.0–10.5)

## 2016-07-17 LAB — COMPREHENSIVE METABOLIC PANEL
ALBUMIN: 3.5 g/dL (ref 3.5–5.0)
ALT: 17 U/L (ref 14–54)
ANION GAP: 3 — AB (ref 5–15)
AST: 15 U/L (ref 15–41)
Alkaline Phosphatase: 46 U/L (ref 38–126)
BILIRUBIN TOTAL: 0.2 mg/dL — AB (ref 0.3–1.2)
BUN: 9 mg/dL (ref 6–20)
CO2: 22 mmol/L (ref 22–32)
Calcium: 8.6 mg/dL — ABNORMAL LOW (ref 8.9–10.3)
Chloride: 109 mmol/L (ref 101–111)
Creatinine, Ser: 0.52 mg/dL (ref 0.44–1.00)
GFR calc Af Amer: >60 mL/min (ref >60)
GFR calc non Af Amer: >60 mL/min (ref >60)
GLUCOSE: 92 mg/dL (ref 65–99)
POTASSIUM: 3.6 mmol/L (ref 3.5–5.1)
SODIUM: 134 mmol/L — AB (ref 135–145)
TOTAL PROTEIN: 6.6 g/dL (ref 6.5–8.1)

## 2016-07-17 LAB — URINALYSIS, ROUTINE W REFLEX MICROSCOPIC
Bilirubin Urine: NEGATIVE
Glucose, UA: NEGATIVE mg/dL
Hgb urine dipstick: NEGATIVE
KETONES UR: NEGATIVE mg/dL
NITRITE: NEGATIVE
PH: 5.5 (ref 5.0–8.0)
PROTEIN: NEGATIVE mg/dL
Specific Gravity, Urine: 1.025 (ref 1.005–1.030)

## 2016-07-17 LAB — HCG, SERUM, QUALITATIVE: Preg, Serum: NEGATIVE

## 2016-07-18 DIAGNOSIS — N319 Neuromuscular dysfunction of bladder, unspecified: Secondary | ICD-10-CM | POA: Diagnosis not present

## 2016-07-18 DIAGNOSIS — R350 Frequency of micturition: Secondary | ICD-10-CM | POA: Diagnosis not present

## 2016-07-21 ENCOUNTER — Ambulatory Visit (HOSPITAL_COMMUNITY)
Admission: RE | Admit: 2016-07-21 | Discharge: 2016-07-21 | Disposition: A | Discharge status: Home / Self Care | Payer: Medicare Other | Source: Ambulatory Visit | Attending: Obstetrics and Gynecology | Admitting: Obstetrics and Gynecology

## 2016-07-21 ENCOUNTER — Encounter (HOSPITAL_COMMUNITY)
Admission: RE | Disposition: A | Discharge status: Home / Self Care | Payer: Self-pay | Source: Ambulatory Visit | Attending: Obstetrics and Gynecology

## 2016-07-21 ENCOUNTER — Encounter (HOSPITAL_COMMUNITY): Payer: Self-pay | Admitting: *Deleted

## 2016-07-21 ENCOUNTER — Ambulatory Visit (HOSPITAL_COMMUNITY): Payer: Medicare Other | Admitting: Anesthesiology

## 2016-07-21 DIAGNOSIS — Z8261 Family history of arthritis: Secondary | ICD-10-CM | POA: Insufficient documentation

## 2016-07-21 DIAGNOSIS — Z6835 Body mass index (BMI) 35.0-35.9, adult: Secondary | ICD-10-CM | POA: Insufficient documentation

## 2016-07-21 DIAGNOSIS — Z820 Family history of epilepsy and other diseases of the nervous system: Secondary | ICD-10-CM | POA: Diagnosis not present

## 2016-07-21 DIAGNOSIS — Z9049 Acquired absence of other specified parts of digestive tract: Secondary | ICD-10-CM | POA: Insufficient documentation

## 2016-07-21 DIAGNOSIS — Z87891 Personal history of nicotine dependence: Secondary | ICD-10-CM | POA: Insufficient documentation

## 2016-07-21 DIAGNOSIS — Z801 Family history of malignant neoplasm of trachea, bronchus and lung: Secondary | ICD-10-CM | POA: Diagnosis not present

## 2016-07-21 DIAGNOSIS — Z809 Family history of malignant neoplasm, unspecified: Secondary | ICD-10-CM | POA: Diagnosis not present

## 2016-07-21 DIAGNOSIS — Z302 Encounter for sterilization: Secondary | ICD-10-CM | POA: Diagnosis not present

## 2016-07-21 DIAGNOSIS — D649 Anemia, unspecified: Secondary | ICD-10-CM | POA: Insufficient documentation

## 2016-07-21 DIAGNOSIS — N920 Excessive and frequent menstruation with regular cycle: Secondary | ICD-10-CM | POA: Insufficient documentation

## 2016-07-21 DIAGNOSIS — Z833 Family history of diabetes mellitus: Secondary | ICD-10-CM | POA: Insufficient documentation

## 2016-07-21 DIAGNOSIS — E669 Obesity, unspecified: Secondary | ICD-10-CM | POA: Diagnosis not present

## 2016-07-21 DIAGNOSIS — N921 Excessive and frequent menstruation with irregular cycle: Secondary | ICD-10-CM | POA: Diagnosis not present

## 2016-07-21 DIAGNOSIS — Z79899 Other long term (current) drug therapy: Secondary | ICD-10-CM | POA: Diagnosis not present

## 2016-07-21 DIAGNOSIS — D271 Benign neoplasm of left ovary: Secondary | ICD-10-CM | POA: Diagnosis not present

## 2016-07-21 DIAGNOSIS — Z800 Family history of malignant neoplasm of digestive organs: Secondary | ICD-10-CM | POA: Insufficient documentation

## 2016-07-21 DIAGNOSIS — Z8249 Family history of ischemic heart disease and other diseases of the circulatory system: Secondary | ICD-10-CM | POA: Diagnosis not present

## 2016-07-21 DIAGNOSIS — I10 Essential (primary) hypertension: Secondary | ICD-10-CM | POA: Insufficient documentation

## 2016-07-21 DIAGNOSIS — Z888 Allergy status to other drugs, medicaments and biological substances status: Secondary | ICD-10-CM | POA: Diagnosis not present

## 2016-07-21 DIAGNOSIS — K219 Gastro-esophageal reflux disease without esophagitis: Secondary | ICD-10-CM | POA: Diagnosis not present

## 2016-07-21 DIAGNOSIS — Z818 Family history of other mental and behavioral disorders: Secondary | ICD-10-CM | POA: Insufficient documentation

## 2016-07-21 DIAGNOSIS — G35 Multiple sclerosis: Secondary | ICD-10-CM | POA: Diagnosis not present

## 2016-07-21 DIAGNOSIS — Z8489 Family history of other specified conditions: Secondary | ICD-10-CM | POA: Insufficient documentation

## 2016-07-21 DIAGNOSIS — I38 Endocarditis, valve unspecified: Secondary | ICD-10-CM | POA: Insufficient documentation

## 2016-07-21 DIAGNOSIS — Z803 Family history of malignant neoplasm of breast: Secondary | ICD-10-CM | POA: Diagnosis not present

## 2016-07-21 HISTORY — PX: DILITATION & CURRETTAGE/HYSTROSCOPY WITH NOVASURE ABLATION: SHX5568

## 2016-07-21 HISTORY — PX: LAPAROSCOPIC BILATERAL SALPINGECTOMY: SHX5889

## 2016-07-21 SURGERY — DILATATION & CURETTAGE/HYSTEROSCOPY WITH NOVASURE ABLATION
Anesthesia: General | Site: Vagina

## 2016-07-21 MED ORDER — ARTIFICIAL TEARS OP OINT
TOPICAL_OINTMENT | OPHTHALMIC | Status: AC
  Filled 2016-07-21: qty 3.5

## 2016-07-21 MED ORDER — CEFAZOLIN SODIUM-DEXTROSE 2-4 GM/100ML-% IV SOLN
2.0000 g | Freq: Once | INTRAVENOUS | Status: AC
  Administered 2016-07-21: 2 g via INTRAVENOUS

## 2016-07-21 MED ORDER — KETOROLAC TROMETHAMINE 10 MG PO TABS: 10.0000 mg | ORAL_TABLET | Freq: Four times a day (QID) | ORAL | 0 refills | Status: DC | PRN

## 2016-07-21 MED ORDER — ARTIFICIAL TEARS OP OINT
TOPICAL_OINTMENT | OPHTHALMIC | Status: DC | PRN
  Administered 2016-07-21: 1 via OPHTHALMIC

## 2016-07-21 MED ORDER — PROPOFOL 10 MG/ML IV BOLUS
INTRAVENOUS | Status: AC
Start: 2016-07-21 — End: 2016-07-21
  Filled 2016-07-21: qty 40

## 2016-07-21 MED ORDER — ONDANSETRON HCL 4 MG/2ML IJ SOLN
4.0000 mg | Freq: Once | INTRAMUSCULAR | Status: AC
  Administered 2016-07-21: 4 mg via INTRAVENOUS

## 2016-07-21 MED ORDER — GLYCOPYRROLATE 0.2 MG/ML IJ SOLN
INTRAMUSCULAR | Status: AC
  Filled 2016-07-21: qty 4

## 2016-07-21 MED ORDER — LACTATED RINGERS IV SOLN
INTRAVENOUS | Status: DC
  Administered 2016-07-21: 07:00:00 via INTRAVENOUS

## 2016-07-21 MED ORDER — ROCURONIUM BROMIDE 50 MG/5ML IV SOLN
INTRAVENOUS | Status: AC
  Filled 2016-07-21: qty 1

## 2016-07-21 MED ORDER — NEOSTIGMINE METHYLSULFATE 10 MG/10ML IV SOLN
INTRAVENOUS | Status: DC | PRN
  Administered 2016-07-21: 4 mg via INTRAVENOUS

## 2016-07-21 MED ORDER — ONDANSETRON HCL 4 MG/2ML IJ SOLN
INTRAMUSCULAR | Status: AC
  Filled 2016-07-21: qty 2

## 2016-07-21 MED ORDER — MIDAZOLAM HCL 2 MG/2ML IJ SOLN
INTRAMUSCULAR | Status: AC
  Filled 2016-07-21: qty 2

## 2016-07-21 MED ORDER — LIDOCAINE HCL (CARDIAC) 20 MG/ML IV SOLN
INTRAVENOUS | Status: DC | PRN
  Administered 2016-07-21: 25 mg via INTRAVENOUS

## 2016-07-21 MED ORDER — CEFAZOLIN IN D5W 1 GM/50ML IV SOLN
INTRAVENOUS | Status: AC
  Filled 2016-07-21: qty 50

## 2016-07-21 MED ORDER — OXYCODONE-ACETAMINOPHEN 5-325 MG PO TABS: 1.0000 | ORAL_TABLET | ORAL | 0 refills | Status: DC | PRN

## 2016-07-21 MED ORDER — NEOSTIGMINE METHYLSULFATE 10 MG/10ML IV SOLN
INTRAVENOUS | Status: AC
  Filled 2016-07-21: qty 1

## 2016-07-21 MED ORDER — SUCCINYLCHOLINE CHLORIDE 20 MG/ML IJ SOLN
INTRAMUSCULAR | Status: DC | PRN
  Administered 2016-07-21: 120 mg via INTRAVENOUS

## 2016-07-21 MED ORDER — FENTANYL CITRATE (PF) 250 MCG/5ML IJ SOLN
INTRAMUSCULAR | Status: AC
  Filled 2016-07-21: qty 5

## 2016-07-21 MED ORDER — 0.9 % SODIUM CHLORIDE (POUR BTL) OPTIME
TOPICAL | Status: DC | PRN
  Administered 2016-07-21: 1000 mL

## 2016-07-21 MED ORDER — CEFAZOLIN SODIUM 10 G IJ SOLR
3.0000 g | INTRAMUSCULAR | Status: DC
  Filled 2016-07-21: qty 3000

## 2016-07-21 MED ORDER — SUCCINYLCHOLINE CHLORIDE 20 MG/ML IJ SOLN
INTRAMUSCULAR | Status: AC
  Filled 2016-07-21: qty 1

## 2016-07-21 MED ORDER — FENTANYL CITRATE (PF) 100 MCG/2ML IJ SOLN
INTRAMUSCULAR | Status: DC | PRN
  Administered 2016-07-21 (×7): 50 ug via INTRAVENOUS

## 2016-07-21 MED ORDER — CEFAZOLIN SODIUM-DEXTROSE 2-4 GM/100ML-% IV SOLN
INTRAVENOUS | Status: AC
  Filled 2016-07-21: qty 100

## 2016-07-21 MED ORDER — LACTATED RINGERS IV SOLN
INTRAVENOUS | Status: DC | PRN
  Administered 2016-07-21 (×2): via INTRAVENOUS

## 2016-07-21 MED ORDER — BUPIVACAINE-EPINEPHRINE (PF) 0.5% -1:200000 IJ SOLN
INTRAMUSCULAR | Status: AC
  Filled 2016-07-21: qty 30

## 2016-07-21 MED ORDER — GLYCOPYRROLATE 0.2 MG/ML IJ SOLN
INTRAMUSCULAR | Status: DC | PRN
  Administered 2016-07-21: 0.6 mg via INTRAVENOUS

## 2016-07-21 MED ORDER — FENTANYL CITRATE (PF) 100 MCG/2ML IJ SOLN
INTRAMUSCULAR | Status: AC
  Filled 2016-07-21: qty 2

## 2016-07-21 MED ORDER — BUPIVACAINE-EPINEPHRINE (PF) 0.5% -1:200000 IJ SOLN
INTRAMUSCULAR | Status: DC | PRN
  Administered 2016-07-21: 30 mL

## 2016-07-21 MED ORDER — CEFAZOLIN IN D5W 1 GM/50ML IV SOLN
1.0000 g | Freq: Once | INTRAVENOUS | Status: AC
  Administered 2016-07-21: 1 g via INTRAVENOUS

## 2016-07-21 MED ORDER — SODIUM CHLORIDE 0.9 % IR SOLN
Status: DC | PRN
  Administered 2016-07-21: 3000 mL

## 2016-07-21 MED ORDER — ROCURONIUM BROMIDE 100 MG/10ML IV SOLN
INTRAVENOUS | Status: DC | PRN
  Administered 2016-07-21 (×2): 10 mg via INTRAVENOUS
  Administered 2016-07-21: 15 mg via INTRAVENOUS

## 2016-07-21 MED ORDER — HYDROMORPHONE HCL 1 MG/ML IJ SOLN: 0.2500 mg | INTRAMUSCULAR | Status: DC | PRN

## 2016-07-21 MED ORDER — MIDAZOLAM HCL 2 MG/2ML IJ SOLN
1.0000 mg | INTRAMUSCULAR | Status: DC | PRN
  Administered 2016-07-21: 2 mg via INTRAVENOUS

## 2016-07-21 MED ORDER — LIDOCAINE HCL (PF) 1 % IJ SOLN
INTRAMUSCULAR | Status: AC
  Filled 2016-07-21: qty 5

## 2016-07-21 MED ORDER — PROPOFOL 10 MG/ML IV BOLUS
INTRAVENOUS | Status: DC | PRN
  Administered 2016-07-21: 150 mg via INTRAVENOUS

## 2016-07-21 SURGICAL SUPPLY — 50 items
ABLATOR ENDOMETRIAL BIPOLAR (ABLATOR) ×4 IMPLANT
BAG HAMPER (MISCELLANEOUS) ×4 IMPLANT
BANDAGE STRIP 1X3 FLEXIBLE (GAUZE/BANDAGES/DRESSINGS) ×10 IMPLANT
BLADE SURG SZ11 CARB STEEL (BLADE) ×4 IMPLANT
CATH ROBINSON RED A/P 16FR (CATHETERS) ×4 IMPLANT
CLOSURE STERI-STRIP 1/4X4 (GAUZE/BANDAGES/DRESSINGS) ×4 IMPLANT
CLOSURE WOUND 1/4 X3 (GAUZE/BANDAGES/DRESSINGS) ×1
CLOTH BEACON ORANGE TIMEOUT ST (SAFETY) ×4 IMPLANT
COVER LIGHT HANDLE STERIS (MISCELLANEOUS) ×8 IMPLANT
DECANTER SPIKE VIAL GLASS SM (MISCELLANEOUS) ×4 IMPLANT
DRSG TEGADERM 2-3/8X2-3/4 SM (GAUZE/BANDAGES/DRESSINGS) ×2 IMPLANT
DURAPREP 26ML APPLICATOR (WOUND CARE) ×4 IMPLANT
ELECT REM PT RETURN 9FT ADLT (ELECTROSURGICAL) ×4
ELECTRODE REM PT RTRN 9FT ADLT (ELECTROSURGICAL) ×2 IMPLANT
FORMALIN 10 PREFIL 120ML (MISCELLANEOUS) ×6 IMPLANT
GLOVE BIOGEL PI IND STRL 7.0 (GLOVE) ×2 IMPLANT
GLOVE BIOGEL PI IND STRL 9 (GLOVE) ×2 IMPLANT
GLOVE BIOGEL PI INDICATOR 7.0 (GLOVE) ×2
GLOVE BIOGEL PI INDICATOR 9 (GLOVE) ×2
GLOVE ECLIPSE 9.0 STRL (GLOVE) ×8 IMPLANT
GOWN SPEC L3 XXLG W/TWL (GOWN DISPOSABLE) ×4 IMPLANT
GOWN STRL REUS W/TWL LRG LVL3 (GOWN DISPOSABLE) ×4 IMPLANT
INST SET HYSTEROSCOPY (KITS) ×4 IMPLANT
INST SET LAPROSCOPIC GYN AP (KITS) ×4 IMPLANT
IV NS IRRIG 3000ML ARTHROMATIC (IV SOLUTION) ×4 IMPLANT
KIT ROOM TURNOVER AP CYSTO (KITS) ×4 IMPLANT
KIT ROOM TURNOVER APOR (KITS) ×4 IMPLANT
MANIFOLD NEPTUNE II (INSTRUMENTS) ×4 IMPLANT
NEEDLE INSUFFLATION 120MM (ENDOMECHANICALS) ×4 IMPLANT
NS IRRIG 1000ML POUR BTL (IV SOLUTION) ×4 IMPLANT
PACK PERI GYN (CUSTOM PROCEDURE TRAY) ×4 IMPLANT
PAD ARMBOARD 7.5X6 YLW CONV (MISCELLANEOUS) ×4 IMPLANT
PAD TELFA 3X4 1S STER (GAUZE/BANDAGES/DRESSINGS) ×4 IMPLANT
SET BASIN LINEN APH (SET/KITS/TRAYS/PACK) ×4 IMPLANT
SET IRRIG Y TYPE TUR BLADDER L (SET/KITS/TRAYS/PACK) ×4 IMPLANT
SHEARS HARMONIC ACE PLUS 36CM (ENDOMECHANICALS) ×4 IMPLANT
SLEEVE ENDOPATH XCEL 5M (ENDOMECHANICALS) ×8 IMPLANT
SOLUTION ANTI FOG 6CC (MISCELLANEOUS) ×4 IMPLANT
SPONGE GAUZE 2X2 8PLY STER LF (GAUZE/BANDAGES/DRESSINGS) ×1
SPONGE GAUZE 2X2 8PLY STRL LF (GAUZE/BANDAGES/DRESSINGS) ×1 IMPLANT
STRIP CLOSURE SKIN 1/4X3 (GAUZE/BANDAGES/DRESSINGS) ×1 IMPLANT
SUT VIC AB 4-0 PS2 27 (SUTURE) ×4 IMPLANT
SUT VICRYL 0 UR6 27IN ABS (SUTURE) ×4 IMPLANT
SYR 30ML LL (SYRINGE) ×4 IMPLANT
SYR CONTROL 10ML LL (SYRINGE) ×4 IMPLANT
SYRINGE 10CC LL (SYRINGE) ×4 IMPLANT
TROCAR ENDO BLADELESS 11MM (ENDOMECHANICALS) ×2 IMPLANT
TROCAR XCEL NON-BLD 5MMX100MML (ENDOMECHANICALS) ×4 IMPLANT
TUBING INSUFFLATION (TUBING) ×4 IMPLANT
WARMER LAPAROSCOPE (MISCELLANEOUS) ×4 IMPLANT

## 2016-07-21 NOTE — Transfer of Care (Signed)
Immediate Anesthesia Transfer of Care Note  Patient: [name]  Procedure(s) Performed: Procedure(s) with comments: DILATATION & CURETTAGE/HYSTEROSCOPY WITH NOVASURE ABLATION (N/A) - Novasure lenght - 5.0 width - 4.1 power - 113 time - 2 min 03 seconds LAPAROSCOPIC RIGHT SALPINGECTOMY AND LEFT OOPHECTOMY (Bilateral)  Patient Location: PACU  Anesthesia Type:General  Level of Consciousness: awake, oriented and patient cooperative  Airway & Oxygen Therapy: Patient Spontanous Breathing and Patient connected to face mask oxygen  Post-op Assessment: Report given to RN and Post -op Vital signs reviewed and stable  Post vital signs: Reviewed and stable  Last Vitals:  Vitals:   07/21/16 0720 07/21/16 0725  BP: 116/74 118/78  Resp: 18 12  Temp:      Last Pain:  Vitals:   07/21/16 0632  TempSrc: Oral      Patients Stated Pain Goal: 6 ([id number])  Complications: No apparent anesthesia complications

## 2016-07-21 NOTE — Anesthesia Preprocedure Evaluation (Signed)
Anesthesia Evaluation  Patient identified by MRN, date of birth, ID band Patient awake    Reviewed: Allergy & Precautions, NPO status , Patient's Chart, lab work & pertinent test results  Airway Mallampati: II  TM Distance: >3 FB Neck ROM: Full    Dental  (+) Teeth Intact   Pulmonary former smoker,    breath sounds clear to auscultation       Cardiovascular + Valvular Problems/Murmurs MR  Rhythm:Regular Rate:Normal     Neuro/Psych PSYCHIATRIC DISORDERS Anxiety Depression Multiple sclerosis     GI/Hepatic GERD  Controlled and Medicated,  Endo/Other  Morbid obesity  Renal/GU      Musculoskeletal   Abdominal   Peds  Hematology  (+) anemia ,   Anesthesia Other Findings   Reproductive/Obstetrics                             Anesthesia Physical Anesthesia Plan  ASA: III  Anesthesia Plan: General   Post-op Pain Management:    Induction: Intravenous, Rapid sequence and Cricoid pressure planned  Airway Management Planned: Oral ETT  Additional Equipment:   Intra-op Plan:   Post-operative Plan: Extubation in OR  Informed Consent: I have reviewed the patients History and Physical, chart, labs and discussed the procedure including the risks, benefits and alternatives for the proposed anesthesia with the patient or authorized representative who has indicated his/her understanding and acceptance.     Plan Discussed with:   Anesthesia Plan Comments:         Anesthesia Quick Evaluation

## 2016-07-21 NOTE — Op Note (Signed)
Please see the brief operative note for surgical details 

## 2016-07-21 NOTE — H&P (Signed)
Expand All Collapse All   Preoperative History and Physical  [name] is a 35 y.o. G1P1 here for surgical management of menorrhagia x today. Patient's last menstrual period was 06/11/2016. No significant preoperative concerns. Pt voices that she doesn't desire any more children at this time. Pt denies any other symptoms.   Proposed surgery: endometrial ablation and bilateral tubal ligation      Past Medical History:  Diagnosis Date  . Fibroid 04/24/2016  . GERD (gastroesophageal reflux disease)   . Hypertension   . Menorrhagia with irregular cycle 04/13/2016  . Multiple sclerosis ([place])    Dr Lonzo Cloud  . Multiple sclerosis exacerbation ([place])   . Numbness in right leg 02/13,12/12  . Obesity (BMI 35.0-39.9 without comorbidity) ([place])   . Other malaise and fatigue 10/20/2013  . Ovarian cyst 04/24/2016  . Regurgitation    cardiac valve. echo 2013 for first eval GILENYA,mod MR,TR        Past Surgical History:  Procedure Laterality Date  . BACK SURGERY  2006  . CESAREAN SECTION  2000  . CHOLECYSTECTOMY  2010  . COLONOSCOPY  09/14/2010   Fields-hyperplastic polyps, int hemorrhoids  . CYST EXCISION  2006   pilonoidal  . ESOPHAGOGASTRODUODENOSCOPY  08/05/2008   Fields-incomplete Schatzki's ring, distal esophageal erosions/esophagitis(mild), small HH, NEGATIVE h pylori  . US ECHOCARDIOGRAPHY  03/19/2012   RV mildly dilated,RA mod. dilated,strial septum aneurysmal,Mod. MR & TR                   OB History  Gravida Para Term Preterm AB Living  1 1       1   SAB TAB Ectopic Multiple Live Births               # Outcome Date GA Lbr Len/2nd Weight Sex Delivery Anes PTL Lv  1 Para             Patient denies any other pertinent gynecologic issues.         Current Outpatient Prescriptions on File Prior to Visit  Medication Sig Dispense Refill  . ALPRAZolam (XANAX) 1 MG tablet Take 0.5 tablets (0.5 mg total) by mouth 2 (two) times daily. 60  tablet 5  . dalfampridine 10 MG TB12 Take 1 tablet (10 mg total) by mouth 2 (two) times daily. 60 tablet 5  . HYDROcodone-acetaminophen (NORCO/VICODIN) 5-325 MG tablet Take 1 tablet by mouth every 6 (six) hours as needed for moderate pain.    Marland Kitchen ibuprofen (ADVIL,MOTRIN) 200 MG tablet Take 600 mg by mouth every 6 (six) hours as needed for mild pain or moderate pain.    . Peginterferon Beta-1a 125 MCG/0.5ML SOPN Inject 125 mcg into the skin every 14 (fourteen) days. 6 pen 3  . ranitidine (ZANTAC) 150 MG tablet Take 150 mg by mouth 2 (two) times daily.    Marland Kitchen sulfamethoxazole-trimethoprim (BACTRIM DS,SEPTRA DS) 800-160 MG tablet Take 1 tablet by mouth 2 (two) times daily.     No current facility-administered medications on file prior to visit.        Allergies  Allergen Reactions  . Propoxyphene N-Acetaminophen Rash    Social History:   reports that she quit smoking about 4 years ago. Her smoking use included Cigarettes. She has a 20.00 pack-year smoking history. She has never used smokeless tobacco. She reports that she does not drink alcohol or use drugs.  Family History  Problem Relation Age of Onset  . Other Mother     lost leg, has  trouble swallowing  . Thyroid nodules Mother   . Heart attack Father   . Coronary artery disease Father   . Diabetes Father   . Rheum arthritis Father   . Colon cancer Paternal Uncle 33  . Colon cancer Paternal Grandmother 47  . Diabetes Paternal Grandmother   . Cancer Paternal Grandmother     breast,lung  . Cancer Maternal Grandmother 64    kind unknown  . Diabetes Maternal Grandmother   . Myasthenia gravis Maternal Grandfather   . Other Sister     tumor on ovary  . ADD / ADHD Son   . Heart attack Paternal Grandfather   . Multiple sclerosis Other     paternal great aunt    Review of Systems: Noncontributory  PHYSICAL EXAM: Last menstrual period 06/11/2016. General appearance - alert, well appearing,  and in no distress Chest - clear to auscultation, no wheezes, rales or rhonchi, symmetric air entry Heart - normal rate and regular rhythm Abdomen - soft, nontender, nondistended, no masses or organomegaly                     Minimal bruising from recent medication injection Pelvic - normal external genitalia, vulva, vagina, cervix, uterus and adnexa, VULVA: normal appearing vulva with no masses, tenderness or lesions,  VAGINA: normal appearing vagina with normal color and discharge, no lesions,  CERVIX: normal appearing cervix without discharge or lesions, multiparous os, UTERUS: uterus is normal size, shape, consistency and nontender, mobile, anterior, no masses ADNEXA: normal adnexa in size, nontender and no masses  Extremities - peripheral pulses normal, no pedal edema, no clubbing or cyanosis  Labs: CBC    Component Value Date/Time   WBC 7.1 07/17/2016 0930   RBC 4.52 07/17/2016 0930   HGB 11.3 (L) 07/17/2016 0930   HCT 34.5 (L) 07/17/2016 0930   HCT 36.0 04/05/2016 1634   HCT 38 02/02/2012 1115   PLT 275 07/17/2016 0930   PLT 319 04/05/2016 1634   MCV 76.3 (L) 07/17/2016 0930   MCV 78 (L) 04/05/2016 1634   MCH 25.0 (L) 07/17/2016 0930   MCHC 32.8 07/17/2016 0930   RDW 15.2 07/17/2016 0930   RDW 14.6 04/05/2016 1634   LYMPHSABS 2.0 04/05/2016 1634   MONOABS 0.4 03/02/2015 2100   EOSABS 0.4 04/05/2016 1634   BASOSABS 0.0 04/05/2016 1634   BMET  BMP Latest Ref Rng & Units 07/17/2016 04/05/2016 09/30/2015  Glucose 65 - 99 mg/dL 92 95 104(H)  BUN 6 - 20 mg/dL 9 7 10   Creatinine 0.44 - 1.00 mg/dL 0.52 0.66 0.64  BUN/Creat Ratio 9 - 23 - 11 16  Sodium 135 - 145 mmol/L 134(L) 142 140  Potassium 3.5 - 5.1 mmol/L 3.6 4.4 5.2  Chloride 101 - 111 mmol/L 109 102 103  CO2 22 - 32 mmol/L 22 23 22   Calcium 8.9 - 10.3 mg/dL 8.6(L) 9.9 9.3       Imaging Studies:  ImagingResults  US Transvaginal Non-ob  Result Date: 06/19/2016 GYNECOLOGIC SONOGRAM [name] is a  35 y.o. G1P1 LMP 05/25/2016 for a pelvic sonogram to f/u complex left ov cyst. Uterus                      9.6 x 5.4 x 5.3 cm, anteverted uterus w/ a post intramural fibroid 2.1 x 1.2 x 1.7 cm Endometrium          11.9 mm, symmetrical, wnl Right ovary  3.4 x 3.8 x 2.9 cm, wnl Left ovary                5.1 x 5.3 x 5.3 cm,  complex left ovarian cyst w/ low level echoes 5 x 4.9 x 4.5 cm (slightly larger) No free fluid,no pain during ultrasound,ov's appear to be Scientist, product/process development Comments: PELVIC US TA/TV: Anteverted uterus w/ a post intramural fibroid 2.1 x 1.2 x 1.7 cm,normal right ov, complex left ovarian cyst w/ low level echoes 5 x 4.9 x 4.5 cm (slightly larger),EEC 11.9 mm,no free fluid,no pain during ultrasound,ov's appear to be mobile [employer] 06/05/2016 10:04 AM Clinical Impression and recommendations: I have reviewed the sonogram results above. Combined with the patient's current clinical course, below are my impressions and any appropriate recommendations for management based on the sonographic findings: 1. Persistent left adnexal cyst, likely ovarian, with low level echoes, no solid component, no internal or external excresences. Benign nature of cyst considered most likely. 2. Intramural fibroid stable, not affecting endometrium. 3. Consideration of removal of cyst at time of ablation to be discussed with the patient. Jayana Kotula V   US Pelvis Complete  Result Date: 06/19/2016 GYNECOLOGIC SONOGRAM SHERRYANN HINZE is a 35 y.o. G1P1 LMP 05/25/2016 for a pelvic sonogram to f/u complex left ov cyst. Uterus                      9.6 x 5.4 x 5.3 cm, anteverted uterus w/ a post intramural fibroid 2.1 x 1.2 x 1.7 cm Endometrium          11.9 mm, symmetrical, wnl Right ovary             3.4 x 3.8 x 2.9 cm, wnl Left ovary                5.1 x 5.3 x 5.3 cm,  complex left ovarian cyst w/ low level echoes 5 x 4.9 x 4.5 cm (slightly larger) No free fluid,no pain during ultrasound,ov's appear to be  mobile Technician Comments: PELVIC US TA/TV: Anteverted uterus w/ a post intramural fibroid 2.1 x 1.2 x 1.7 cm,normal right ov, complex left ovarian cyst w/ low level echoes 5 x 4.9 x 4.5 cm (slightly larger),EEC 11.9 mm,no free fluid,no pain during ultrasound,ov's appear to be mobile [employer] 06/05/2016 10:04 AM Clinical Impression and recommendations: I have reviewed the sonogram results above. Combined with the patient's current clinical course, below are my impressions and any appropriate recommendations for management based on the sonographic findings: 1. Persistent left adnexal cyst, likely ovarian, with low level echoes, no solid component, no internal or external excresences. Benign nature of cyst considered most likely. 2. Intramural fibroid stable, not affecting endometrium. 3. Consideration of removal of cyst at time of ablation to be discussed with the patient. Emmajean Ratledge V     Assessment:     Patient Active Problem List   Diagnosis Date Noted  . Fibroid 04/24/2016  . Ovarian cyst 04/24/2016  . Menorrhagia with irregular cycle 04/13/2016  . Frontal sinusitis 09/30/2015  . Photosensitivity 03/30/2015  . Vertigo, aural 03/30/2015  . Morbid obesity ([place]) 10/19/2014  . Diplopia 10/19/2014  . MS (multiple sclerosis) ([place]) 06/17/2014  . Abnormality of gait 06/17/2014  . Other malaise and fatigue 10/20/2013  . Insomnia 08/06/2013  . Mitral insufficiency 05/17/2013  . Tricuspid insufficiency 05/17/2013  . Multiple sclerosis ([place]) 01/23/2013  . Severe obesity (BMI >= 40) ([place]) 01/23/2013  . 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 Depression with anxiety 01/23/2013  . Anemia 02/22/2012  . Hematochezia 02/22/2012  . Globus sensation 02/22/2012  . Dysphagia 02/22/2012  . GERD 09/01/2010  . CONSTIPATION, CHRONIC 09/01/2010  hcg negative 9/25  Plan: 1. 2.5 mg Provera Rx daily for use prior to surgical management, to suppress endometrium. As pt under age 26 with regular  menses will not require preAblation endometrial sampling. 2. Patient will undergo surgical management with endometrial ablation and tubal ligation, to be scheduled for late September.   By signing my name below, I, Soijett Blue, attest that this documentation has been prepared under the direction and in the presence of Jonnie Kind, MD. Electronically Signed: Soijett Blue, ED Scribe. 06/21/16. 11:49 AM.  I personally performed the services described in this documentation, which was SCRIBED in my presence. The recorded information has been reviewed and considered accurate. It has been edited as necessary during review. Jonnie Kind, MD

## 2016-07-21 NOTE — Anesthesia Procedure Notes (Signed)
Procedure Name: Intubation Date/Time: 07/21/2016 7:43 AM Performed by: Andree Elk, Etter Royall A Pre-anesthesia Checklist: Patient identified, Patient being monitored, Timeout performed, Emergency Drugs available and Suction available Patient Re-evaluated:Patient Re-evaluated prior to inductionOxygen Delivery Method: Circle System Utilized Preoxygenation: Pre-oxygenation with 100% oxygen Intubation Type: IV induction, Rapid sequence and Cricoid Pressure applied Ventilation: Mask ventilation without difficulty Laryngoscope Size: Miller and 3 Grade View: Grade I Tube type: Oral Tube size: 7.0 mm Number of attempts: 1 Airway Equipment and Method: Stylet Placement Confirmation: ETT inserted through vocal cords under direct vision,  positive ETCO2 and breath sounds checked- equal and bilateral Secured at: 21 cm Tube secured with: Tape Dental Injury: Teeth and Oropharynx as per pre-operative assessment

## 2016-07-21 NOTE — Interval H&P Note (Signed)
History and Physical Interval Note:  07/21/2016 7:24 AM  [name]  has presented today for surgery, with the diagnosis of menorrhagia, steralization  The various methods of treatment have been discussed with the patient and family. After consideration of risks, benefits and other options for treatment, the patient has consented to  Procedure(s): DILATATION & CURETTAGE/HYSTEROSCOPY WITH NOVASURE ABLATION (N/A) LAPAROSCOPIC BILATERAL SALPINGECTOMY (Bilateral) as a surgical intervention .  The patient's history has been reviewed, patient examined, no change in status, stable for surgery.  I have reviewed the patient's chart and labs.  Questions were answered to the patient's satisfaction.  The patient has asked that the ovary be assessed, and I plan to photo document the ovary with cyst, and removal of the cyst or ovary would only be done if suspicious characteristics noted. The patient agrees to this plan.   [name]

## 2016-07-21 NOTE — Anesthesia Postprocedure Evaluation (Signed)
Anesthesia Post Note  Patient: [name]  Procedure(s) Performed: Procedure(s) (LRB): DILATATION & CURETTAGE/HYSTEROSCOPY WITH NOVASURE ABLATION (N/A) LAPAROSCOPIC RIGHT SALPINGECTOMY AND LEFT OOPHECTOMY (Bilateral)  Patient location during evaluation: PACU Anesthesia Type: General Level of consciousness: awake and alert and oriented Pain management: pain level controlled Vital Signs Assessment: post-procedure vital signs reviewed and stable Respiratory status: patient connected to face mask oxygen Cardiovascular status: stable Postop Assessment: no signs of nausea or vomiting Anesthetic complications: no    Last Vitals:  Vitals:   07/21/16 0725 07/21/16 0945  BP: 118/78 134/70  Pulse:  89  Resp: 12 15  Temp:  36.6 C    Last Pain:  Vitals:   07/21/16 0632  TempSrc: Oral                 Narcisa Ganesh A

## 2016-07-21 NOTE — Brief Op Note (Signed)
07/21/2016  9:43 AM  PATIENT:  [name]  35 y.o. female  PRE-OPERATIVE DIAGNOSIS:  menorrhagia, steralization  POST-OPERATIVE DIAGNOSIS:  menorrhagia, steralization, left ovarian mass (left ovarian dermoid)  PROCEDURE:  Procedure(s) with comments: DILATATION & CURETTAGE/HYSTEROSCOPY WITH NOVASURE ABLATION (N/A) - Novasure lenght - 5.0 width - 4.1 power - 113 time - 2 min 03 seconds LAPAROSCOPIC RIGHT SALPINGECTOMY AND LEFT OOPHECTOMY (Bilateral)  corrected as laparoscopic left salpingo-oophorectomy and right salpingectomy  SURGEON:  Surgeon(s) and Role:    * Jonnie Kind, MD - Primary  PHYSICIAN ASSISTANT:   ASSISTANTS: None   ANESTHESIA:   local, general and paracervical block  EBL:  Total I/O In: 1200 [I.V.:1200] Out: 15 [Blood:15]  BLOOD ADMINISTERED:none  DRAINS: none   LOCAL MEDICATIONS USED:  MARCAINE    and Amount: 20 ml paracervical and 10 abdominal wall  SPECIMEN:  Source of Specimen:  Left tube and ovary, right fallopian tube  DISPOSITION OF SPECIMEN:  PATHOLOGY  COUNTS:  YES  TOURNIQUET:  * No tourniquets in log *  DICTATION: .Dragon Dictation  PLAN OF CARE: Discharge to home after PACU  PATIENT DISPOSITION:  PACU - hemodynamically stable.   Delay start of Pharmacological VTE agent (>24hrs) due to surgical blood loss or risk of bleeding: not applicable Details of procedure. Patient was taken operating room prepped and draped for combined abdominal and vaginal procedure legs in low lithotomy support, with timeout conducted by surgical team and Ancef administered. The infraumbilical vertical 1 cm skin incision was well made as well as a transverse suprapubic and vision at the level of the old cesarean scar and a right lower quadrant 1 cm incision. Veress needle was used through the umbilicus to achieve pneumoperitoneum under 9 mmHg pressure, with easy filling in the abdominal cavity. Laparoscopic trocar was then inserted and visualization showed  no evidence of trauma. There was omental adhesions to the anterior abdominal wall which were addressed later in the case. Suprapubic and right lower quadrant trocar millimeter trochars could be placed without difficulty. Harmonic scalpel was then used to take down the omental adhesions to the anterior abdominal wall. Attention was then directed to the right fallopian tube, which was elevated and salpingectomy performed in standard fashion using the Harmonic Ace 7 as energy device. Hemostasis was good. On the left side the cul-de-sac was filled with a large firm abnormally thickened ovary. There was no evidence of recent ovulation. It was considered to be a benign but abnormal ovary so as per our discussions the oophorectomy was initiated. A 7 was then again used for salpingectomy being careful to stay well away from the left retroperitoneum and photos were taken during the procedure document the procedure. The  left salpingectomy was then done to complete the procedure. A 11 mm trocar was placed through the umbilical site with sharp dissection used to widen the skin incision to 5 cm and the fascia opened transversely 5 cm. Bag was used to snare the specimen which was extracted to the abdominal wall. The specimen bag could then be opened in a sterile controlled fashion sufficiently to allow the needle to be used to decompress the cystic portion of the left ovary. The cystic fluid was obtained from the first loculation in the second loculation had classic waxy sebaceous material consistent with an ovarian dermoid all were taken out sufficiently to decompress the ovary so it can be extracted through the suprapubic trocar and incision site. Reinflation the abdomen confirmed no bleeding and no trauma to  adjacent organs. [name] was instilled in the abdomen 60 cc and laparoscopic equipment removed and the suprapubic incision closed at the fascial level with 0 Vicryl and then infiltrated with Marcaine at the fascia.  Cutaneous tissues were irrigated, subcuticular 4-0 Vicryl used to close all 3 trocar sites. Next Endometrial ablation: At this time the surgeon repositioned with new gloves and the endometrial ablation was performed. Speculum was inserted the cervix grasped with single-tooth tenaculum, paracervical block applied, and the uterus sounded to 9 cm, with dilation of the endocervical canal 23 [place] allowing introduction of the 30 rigid hysteroscope visualizing normal endometrial cavity. Curettage and obtained minimal tissue sample was insufficient for sending for pathology. Tubal ostia could be visualized the NovaSure endometrial device was inserted activated and the ablation sequence completed as described above 2 minute 2 seconds completion time was performed are device was removed and patient allowed to go recovery room in stable condition urge home with Percocet and Toradol

## 2016-07-21 NOTE — Anesthesia Postprocedure Evaluation (Signed)
Anesthesia Post Note  Patient: [name]  Procedure(s) Performed: Procedure(s) (LRB): DILATATION & CURETTAGE/HYSTEROSCOPY WITH NOVASURE ABLATION (N/A) LAPAROSCOPIC RIGHT SALPINGECTOMY AND LEFT OOPHECTOMY (Bilateral)  Patient location during evaluation: PACU Anesthesia Type: General Level of consciousness: awake and alert and oriented Pain management: pain level controlled Vital Signs Assessment: post-procedure vital signs reviewed and stable Respiratory status: spontaneous breathing and patient connected to face mask oxygen Cardiovascular status: stable Postop Assessment: no signs of nausea or vomiting Anesthetic complications: no    Last Vitals:  Vitals:   07/21/16 0725 07/21/16 0945  BP: 118/78 134/70  Pulse:  89  Resp: 12 15  Temp:  36.6 C    Last Pain:  Vitals:   07/21/16 0632  TempSrc: Oral                 [name] A

## 2016-07-21 NOTE — Discharge Instructions (Signed)
Salpingectomy, Care After Refer to this sheet in the next few weeks. These instructions provide you with information on caring for yourself after your procedure. Your health care provider may also give you more specific instructions. Your treatment has been planned according to current medical practices, but problems sometimes occur. Call your health care provider if you have any problems or questions after your procedure. WHAT TO EXPECT AFTER THE PROCEDURE After your procedure, it is typical to have the following:  Abdominal pain that can be controlled with pain medicine.  Vaginal spotting.  Tiredness. HOME CARE INSTRUCTIONS  Get plenty of rest and sleep.  Only take over-the-counter or prescription medicines as directed by your health care provider.  Keep incision areas clean and dry. Remove or change any bandages (dressings) only as directed by your health care provider.  You may resume your regular diet. Eat a well-balanced diet.  Drink enough fluids to keep your urine clear or pale yellow.  Limit exercise and activities as directed by your health care provider. Do not lift anything heavier than 5 lb (2.3 kg) until your health care provider approves.  Do not drive until your health care provider approves.  Do not have sexual intercourse until your health care provider says it is okay.  Take your temperature twice a day for the first week. Write those temperatures down.  Follow up with your health care provider as directed. SEEK MEDICAL CARE IF:  You have pain when you urinate.  You see pus coming out of the incision, or the incision is separating.  You have increasing abdominal pain.  You have swelling or redness in the incision area.  You develop a rash.  You feel lightheaded.  You have pain that is not controlled with medicine. SEEK IMMEDIATE MEDICAL CARE IF:  You develop a fever.  You have increasing abdominal pain.  You develop chest or leg pain.  You  develop shortness of breath.  You pass out.   This information is not intended to replace advice given to you by your health care provider. Make sure you discuss any questions you have with your health care provider.   Document Released: 01/13/2011 Document Revised: 10/30/2014 Document Reviewed: 04/02/2013 Elsevier Interactive Patient Education 2016 Elsevier Inc. Endometrial ablation Endometrial Ablation Endometrial ablation removes the lining of the uterus (endometrium). It is usually a same-day, outpatient treatment. Ablation helps avoid major surgery, such as surgery to remove the cervix and uterus (hysterectomy). After endometrial ablation, you will have little or no menstrual bleeding and may not be able to have children. However, if you are premenopausal, you will need to use a reliable method of birth control following the procedure because of the small chance that pregnancy can occur. There are different reasons to have this procedure. These reasons include:  Heavy periods.  Bleeding that is causing anemia.  Irregular bleeding.  Bleeding fibroids on the lining inside the uterus if they are smaller than 3 centimeters. This procedure may not be possible for you if:   You want to have children in the future.   You have severe cramps with your menstrual period.   You have precancerous or cancerous cells in your uterus.   You were recently pregnant.   You have gone through menopause.   You have had major surgery on your uterus, resulting in thinning of the uterine wall. Surgeries may include:  The removal of one or more uterine fibroids (myomectomy).  A cesarean section with a classic (vertical) incision on your  uterus. Ask your health care provider what type of cesarean you had. Sometimes the scar on your skin is different than the scar on your uterus. Even if you have had surgery on your uterus, certain types of ablation may still be safe for you. Talk with your health  care provider. LET [facility] CARE PROVIDER KNOW ABOUT:  Any allergies you have.  All medicines you are taking, including vitamins, herbs, eye drops, creams, and over-the-counter medicines.  Previous problems you or members of your family have had with the use of anesthetics.  Any blood disorders you have.  Previous surgeries you have had.  Medical conditions you have. RISKS AND COMPLICATIONS  Generally, this is a safe procedure. However, as with any procedure, complications can occur. Possible complications include:  Perforation of the uterus.  Bleeding.  Infection of the uterus, bladder, or vagina.  Injury to surrounding organs.  An air bubble to the lung (air embolus).  Pregnancy following the procedure.  Failure of the procedure to help the problem, requiring hysterectomy.  Decreased ability to diagnose cancer in the lining of the uterus. BEFORE THE PROCEDURE  The lining of the uterus must be tested to make sure there is no pre-cancerous or cancer cells present.  An ultrasound may be performed to look at the size of the uterus and to check for abnormalities.  Medicines may be given to thin the lining of the uterus. PROCEDURE  During the procedure, your health care provider will use a tool called a resectoscope to help see inside your uterus. There are different ways to remove the lining of your uterus.   Radiofrequency - This method uses a radiofrequency-alternating electric current to remove the lining of the uterus.  Cryotherapy - This method uses extreme cold to freeze the lining of the uterus.  Heated-Free Liquid - This method uses heated salt (saline) solution to remove the lining of the uterus.  Microwave - This method uses high-energy microwaves to heat up the lining of the uterus to remove it.  Thermal balloon - This method involves inserting a catheter with a balloon tip into the uterus. The balloon tip is filled with heated fluid to remove the lining  of the uterus. AFTER THE PROCEDURE  After your procedure, do not have sexual intercourse or insert anything into your vagina until permitted by your health care provider. After the procedure, you may experience:  Cramps.  Vaginal discharge.  Frequent urination.   This information is not intended to replace advice given to you by your health care provider. Make sure you discuss any questions you have with your health care provider.   Document Released: 08/18/2004 Document Revised: 06/30/2015 Document Reviewed: 03/12/2013 Elsevier Interactive Patient Education 2016 Elsevier Inc.   PATIENT INSTRUCTIONS POST-ANESTHESIA  IMMEDIATELY FOLLOWING SURGERY:  Do not drive or operate machinery for the first twenty four hours after surgery.  Do not make any important decisions for twenty four hours after surgery or while taking narcotic pain medications or sedatives.  If you develop intractable nausea and vomiting or a severe headache please notify your doctor immediately.  FOLLOW-UP:  Please make an appointment with your surgeon as instructed. You do not need to follow up with anesthesia unless specifically instructed to do so.  WOUND CARE INSTRUCTIONS (if applicable):  Keep a dry clean dressing on the anesthesia/puncture wound site if there is drainage.  Once the wound has quit draining you may leave it open to air.  Generally you should leave the bandage intact  for twenty four hours unless there is drainage.  If the epidural site drains for more than 36-48 hours please call the anesthesia department.  QUESTIONS?:  Please feel free to call your physician or the hospital operator if you have any questions, and they will be happy to assist you.

## 2016-07-24 ENCOUNTER — Encounter (HOSPITAL_COMMUNITY): Payer: Self-pay | Admitting: Obstetrics and Gynecology

## 2016-08-02 ENCOUNTER — Ambulatory Visit (INDEPENDENT_AMBULATORY_CARE_PROVIDER_SITE_OTHER): Payer: Medicare Other | Admitting: Obstetrics and Gynecology

## 2016-08-02 ENCOUNTER — Encounter: Payer: Self-pay | Admitting: Obstetrics and Gynecology

## 2016-08-02 VITALS — BP 140/80 | HR 78 | Ht 68.0 in | Wt 269.0 lb

## 2016-08-02 DIAGNOSIS — Z9889 Other specified postprocedural states: Secondary | ICD-10-CM

## 2016-08-02 DIAGNOSIS — Z09 Encounter for follow-up examination after completed treatment for conditions other than malignant neoplasm: Secondary | ICD-10-CM

## 2016-08-02 NOTE — Progress Notes (Signed)
   Subjective:  [name] is a 35 y.o. female now 2 weeks status post [place] and laparoscopic left salpingo-oophorectomy and right salpingectomy.    Review of Systems Negative except minimal vaginal discharge   Diet:   normal   Bowel movements : normal.  The patient is not having any pain.  Objective:  BP 140/80   Pulse 78   Ht 5\' 8"  (1.727 m)   Wt 269 lb (122 kg)   LMP 07/21/2016   BMI 40.90 kg/m  General:Well developed, well nourished.  No acute distress. Abdomen: Bowel sounds normal, soft, non-tender. Healing normally  Pelvic Exam: not indicated   Incision(s):   Healing well, no drainage, no erythema, no hernia, no swelling, no dehiscence,     Assessment:  Post-Op 2 weeks s/p DILATATION & CURETTAGE/HYSTEROSCOPY WITH NOVASURE ABLATION and laparoscopic left salpingo-oophorectomy and right salpingectomy.   left ovarian dermoid cyst benign   Doing well postoperatively.   Plan:  1.Wound care discussed   2. . current medications. 3. Activity restrictions: none 4. return to work: now. 5. Follow up in 3 years.  By signing my name below, I, Sonum Patel, attest that this documentation has been prepared under the direction and in the presence of Jonnie Kind, MD. Electronically Signed: Sonum Patel, Education administrator. 08/02/16. 10:10 AM.  I personally performed the services described in this documentation, which was SCRIBED in my presence. The recorded information has been reviewed and considered accurate. It has been edited as necessary during review. Jonnie Kind, MD

## 2016-08-07 ENCOUNTER — Ambulatory Visit (INDEPENDENT_AMBULATORY_CARE_PROVIDER_SITE_OTHER): Payer: Medicare Other | Admitting: Adult Health

## 2016-08-07 ENCOUNTER — Encounter: Payer: Self-pay | Admitting: Adult Health

## 2016-08-07 VITALS — BP 118/84 | HR 80 | Ht 68.0 in | Wt 269.4 lb

## 2016-08-07 DIAGNOSIS — R339 Retention of urine, unspecified: Secondary | ICD-10-CM | POA: Diagnosis not present

## 2016-08-07 DIAGNOSIS — G35 Multiple sclerosis: Secondary | ICD-10-CM

## 2016-08-07 NOTE — Progress Notes (Signed)
I agree with the assessment and plan as directed by NP .The patient is known to me .   Aadarsh Cozort, MD  

## 2016-08-07 NOTE — Patient Instructions (Signed)
Continue Pledrigy MRI brain and cervical spine If your symptoms worsen or you develop new symptoms please let us know.

## 2016-08-07 NOTE — Progress Notes (Signed)
PATIENT: [name] DOB: 03-08-1981  REASON FOR VISIT: follow up- multiple sclerosis HISTORY FROM: patient  HISTORY OF PRESENT ILLNESS: Today 08/07/2016: [name] is a 35 year old female with a history of multiple sclerosis. She returns today for follow-up. She reports that overall she has remained stable. She continues on Plegridy and tolerates it well. She denies any new numbness or weakness. Denies any changes with her gait or balance. Denies any changes with her vision. She does report changes with her bladder. Reports that she now has to catheterize herself due to urinary retention. She has followed with Dr. Zigmund Daniel and they feel that multiple sclerosis has affected her ability to contract her bladder. Patient's last MRI of the brain and spine was October 2016. At that time there was no change. She returns today for an evaluation.  HISTORY  [facility]): [name] is a 35 year-old caucasian female with history of RRMS.  She returns today for sooner followup, due to diplopia and vertigo. Vertigo worse after mowing the lawn. Today still present. I suspect she is dehydrated.  03-30-15, she continued Plegredy pen and is doing well on it, q 14 days.  Patient was in the past unwilling to try tysabri. She has tried seroquel , which helped her to get to sleep at night and she is avoiding daytime naps. She checks on her grand parents , who are in poor health.   On 08/20/14, she called reporting double vision. This is how her exacerbations have started in the past. Dr. Brett Fairy gave 20 mg prednisone tabs on taper for the next month. She stated that the prednisone pack usually helps but not this time.She is almost finished with this.  No pain, both eyes she states are affected, R side more (states that she can not move the eye all the way over horizontally).The diplopia is giving her vertigo as well.  07/29/14: Started Ampyra, has been only able to tolerate once daily due to  nausea. Feels like it has improved her ability to walk. Her father who is with her states that he has seen a definite improvement in her mobility - but she still has pain in the right thigh. Last visit walking test showed: 54 seconds for 6 times 18 feet Walking with turns. Still complains of memory difficulties. Mood is some better. Nausea on Tecfidera, tolerated only 60 mg Bid with applesauce. Patient had a brain MRI 11-06-13 with acute enhancing lesions, left frontal. Here today to get a 60 mg bid prescription of tecfidera and to discuss Ampyra.   Dr. Benn Moulder mentioned her high degree of sleepiness. Sent for SPLIT study. Patient had a normal PSG, no significant AHi, no PLMs, no RDI and only spontaneous arousals.anxiety/ depression in the diff. diagnosis. She has considered taking stimulant medications but couldn't use these with her heart history. Her Use of Seroquel allowed for deep sleep at a 25 mg daily dose and she feels better.  Until than she still took naps 2 hours in the morning after her son leaves for school, slept from 23.00 hours to 06.30. Epworth sleepiness score no endorsed at 6 points and her fatigue severity score at 57 pints per liter very high. [name] in her last visit performed a Hetland test , on which the patient scored 23/30 points indicating cognitive impairment or depression related to cognitive decline. Generate more than 7 bruits beginning was a certain left, and all he recalled 2/5 recall ports, test was repeated, the patient scored 26/30.today .  Interval history from 04/05/2016, I have seen [name] by now for over a decade. And she brought today some problems that are not strictly neurologic to my attention. She has more frequent but not painful menstrual cycles, she no longer can count on a 28 day interval. I do not think that this is anything to do with her neurologic condition of multiple sclerosis or the treatment thereby. I do think he is to see her  gynecologist. If she has myoma these may also affect her ability to empty her bladder or her urine production. Usually fibromata  cause pain, she denies having any. She has urinary urge and has small volume of urine.    REVIEW OF SYSTEMS: Out of a complete 14 system review of symptoms, the patient complains only of the following symptoms, and all other reviewed systems are negative.  Back pain  ALLERGIES: Allergies  Allergen Reactions  . Propoxyphene N-Acetaminophen Rash    HOME MEDICATIONS: Outpatient Medications Prior to Visit  Medication Sig Dispense Refill  . ALPRAZolam (XANAX) 1 MG tablet Take 0.5 tablets (0.5 mg total) by mouth 2 (two) times daily. 60 tablet 5  . dalfampridine 10 MG TB12 Take 1 tablet (10 mg total) by mouth 2 (two) times daily. 60 tablet 5  . Peginterferon Beta-1a 125 MCG/0.5ML SOPN Inject 125 mcg into the skin every 14 (fourteen) days. 6 pen 3  . ranitidine (ZANTAC) 150 MG tablet Take 150 mg by mouth 2 (two) times daily.    Marland Kitchen oxyCODONE-acetaminophen (PERCOCET/ROXICET) 5-325 MG tablet Take 1-2 tablets by mouth every 4 (four) hours as needed for moderate pain or severe pain. 20 tablet 0   No facility-administered medications prior to visit.     PAST MEDICAL HISTORY: Past Medical History:  Diagnosis Date  . Anxiety   . Depression   . Enlarged heart   . Fibroid 04/24/2016  . GERD (gastroesophageal reflux disease)   . Leaky heart valve   . Menorrhagia with irregular cycle 04/13/2016  . Multiple sclerosis ([place])    Dr Lonzo Cloud  . Multiple sclerosis exacerbation ([place])   . Numbness in right leg 02/13,12/12  . Obesity (BMI 35.0-39.9 without comorbidity)   . Other malaise and fatigue 10/20/2013  . Ovarian cyst 04/24/2016  . Regurgitation    cardiac valve. echo 2013 for first eval GILENYA,mod MR,TR    PAST SURGICAL HISTORY: Past Surgical History:  Procedure Laterality Date  . BACK SURGERY  2006  . CESAREAN SECTION  2000  . CHOLECYSTECTOMY  2010  .  COLONOSCOPY  09/14/2010   Fields-hyperplastic polyps, int hemorrhoids  . CYST EXCISION  2006   pilonoidal  . DILITATION & CURRETTAGE/HYSTROSCOPY WITH NOVASURE ABLATION N/A 07/21/2016   Procedure: DILATATION & CURETTAGE/HYSTEROSCOPY WITH NOVASURE ABLATION;  Surgeon: Jonnie Kind, MD;  Location: [facility];  Service: Gynecology;  Laterality: N/A;  Novasure lenght - 5.0 width - 4.1 power - 113 time - 2 min 03 seconds  . ESOPHAGOGASTRODUODENOSCOPY  08/05/2008   Fields-incomplete Schatzki's ring, distal esophageal erosions/esophagitis(mild), small HH, NEGATIVE h pylori  . LAPAROSCOPIC BILATERAL SALPINGECTOMY Bilateral 07/21/2016   Procedure: LAPAROSCOPIC RIGHT SALPINGECTOMY AND LEFT OOPHECTOMY;  Surgeon: Jonnie Kind, MD;  Location: [facility];  Service: Gynecology;  Laterality: Bilateral;  . US ECHOCARDIOGRAPHY  03/19/2012   RV mildly dilated,RA mod. dilated,strial septum aneurysmal,Mod. MR & TR    FAMILY HISTORY: Family History  Problem Relation Age of Onset  . Other Mother     lost leg, has trouble swallowing  . Thyroid  nodules Mother   . Heart attack Father   . Coronary artery disease Father   . Diabetes Father   . Rheum arthritis Father   . Colon cancer Paternal Uncle 94  . Colon cancer Paternal Grandmother 33  . Diabetes Paternal Grandmother   . Cancer Paternal Grandmother     breast,lung  . Cancer Maternal Grandmother 48    kind unknown  . Diabetes Maternal Grandmother   . Myasthenia gravis Maternal Grandfather   . Other Sister     tumor on ovary  . ADD / ADHD Son   . Heart attack Paternal Grandfather   . Multiple sclerosis Other     paternal great aunt    SOCIAL HISTORY: Social History   Social History  . Marital status: Married    Spouse name: Divorced   . Number of children: 1  . Years of education: college   Occupational History  . disabled    Social History Main Topics  . Smoking status: Former Smoker    Packs/day: 1.00    Years: 20.00    Types:  Cigarettes    Quit date: 02/15/2012  . Smokeless tobacco: Never Used  . Alcohol use No  . Drug use: No  . Sexual activity: No   Other Topics Concern  . Not on file   Social History Narrative   Patient is divorced and lives at home and he son lives with her.   Patient has one child.   Patient is disabled.   Patient has some college education.   Patient is right-handed.   Patient drinks 3 servings of caffeine daily            PHYSICAL EXAM  Vitals:   08/07/16 1308  BP: 118/84  Pulse: 80  Weight: 269 lb 6.4 oz (122.2 kg)  Height: 5\' 8"  (1.727 m)   Body mass index is 40.96 kg/m.  Generalized: Well developed, in no acute distress   Neurological examination  Mentation: Alert oriented to time, place, history taking. Follows all commands speech and language fluent Cranial nerve II-XII: Pupils were equal round reactive to light. Extraocular movements were full, visual field were full on confrontational test. Facial sensation and strength were normal. Uvula tongue midline. Head turning and shoulder shrug  were normal and symmetric. Motor: The motor testing reveals 5 over 5 strength of all 4 extremities. Good symmetric motor tone is noted throughout.  Sensory: Sensory testing is intact to soft touch on all 4 extremities but decreased on the right upper extremity. No evidence of extinction is noted.  Coordination: Cerebellar testing reveals good finger-nose-finger and heel-to-shin bilaterally.  Gait and station: Gait is normal. Tandem gait is slightly unsteady. Romberg is negative. No drift is seen.  Reflexes: Deep tendon reflexes are symmetric and normal bilaterally.   DIAGNOSTIC DATA (LABS, IMAGING, TESTING) - I reviewed patient records, labs, notes, testing and imaging myself where available.  Lab Results  Component Value Date   WBC 7.1 07/17/2016   HGB 11.3 (L) 07/17/2016   HCT 34.5 (L) 07/17/2016   MCV 76.3 (L) 07/17/2016   PLT 275 07/17/2016      Component Value  Date/Time   NA 134 (L) 07/17/2016 0930   NA 142 04/05/2016 1634   K 3.6 07/17/2016 0930   K 4.2 02/28/2012 1113   CL 109 07/17/2016 0930   CL 107 02/28/2012 1113   CO2 22 07/17/2016 0930   CO2 23 02/28/2012 1113   GLUCOSE 92 07/17/2016 0930   BUN  9 07/17/2016 0930   BUN 7 04/05/2016 1634   CREATININE 0.52 07/17/2016 0930   CREATININE 0.73 02/28/2012 1113   CALCIUM 8.6 (L) 07/17/2016 0930   CALCIUM 9.5 02/28/2012 1113   PROT 6.6 07/17/2016 0930   PROT 6.8 04/05/2016 1634   PROT 6.4 02/28/2012 1113   ALBUMIN 3.5 07/17/2016 0930   ALBUMIN 4.1 04/05/2016 1634   AST 15 07/17/2016 0930   AST 14 02/28/2012 1113   ALT 17 07/17/2016 0930   ALKPHOS 46 07/17/2016 0930   ALKPHOS 49 02/28/2012 1113   BILITOT 0.2 (L) 07/17/2016 0930   BILITOT 0.2 04/05/2016 1634   GFRNONAA >60 07/17/2016 0930   GFRAA >60 07/17/2016 0930       ASSESSMENT AND PLAN 35 y.o. year old female  has a past medical history of Anxiety; Depression; Enlarged heart; Fibroid (04/24/2016); GERD (gastroesophageal reflux disease); Leaky heart valve; Menorrhagia with irregular cycle (04/13/2016); Multiple sclerosis ([place]); Multiple sclerosis exacerbation ([place]); Numbness in right leg (02/13,12/12); Obesity (BMI 35.0-39.9 without comorbidity); Other malaise and fatigue (10/20/2013); Ovarian cyst (04/24/2016); and Regurgitation. here with:  1. Multiple sclerosis 2. Urinary retention  Overall the patient has remained stable. She will continue on Plegridy. Patient recently had blood work that was relatively unremarkable. She will continue follow-up with Dr. Zigmund Daniel. We will recheck an MRI of the brain and cervical spine to look for new or active lesions related to multiple sclerosis. Patient advised that if her symptoms worsen or she develops new symptoms she should let us know. Follow-up in 6 months or sooner if needed.     Ward Givens, MSN, NP-C 08/07/2016, 1:41 PM [facility] [address], [place], [place] [address] [phone]

## 2016-10-11 ENCOUNTER — Telehealth: Payer: Self-pay

## 2016-10-11 NOTE — Telephone Encounter (Signed)
PA ppw filled out, waiting for signature, then will fax back to [facility].

## 2016-10-19 ENCOUNTER — Other Ambulatory Visit: Payer: Self-pay

## 2016-10-19 MED ORDER — DALFAMPRIDINE ER 10 MG PO TB12: 10.0000 mg | ORAL_TABLET | Freq: Two times a day (BID) | ORAL | 5 refills | Status: DC

## 2016-11-01 ENCOUNTER — Other Ambulatory Visit: Payer: Self-pay | Admitting: Neurology

## 2016-11-02 ENCOUNTER — Telehealth: Payer: Self-pay | Admitting: Neurology

## 2016-11-02 NOTE — Telephone Encounter (Signed)
Patient requesting letter to be excused from jury duty. Please mail to the patient.

## 2016-11-02 NOTE — Telephone Encounter (Signed)
RX for xanax faxed to [facility]. Received a receipt of confirmation.

## 2016-11-06 NOTE — Telephone Encounter (Signed)
[place] for pt to miss [employer]? I can write the letter if you agree.

## 2016-11-06 NOTE — Telephone Encounter (Signed)
OK to excuse form [name] duty.

## 2016-11-07 NOTE — Telephone Encounter (Signed)
I called pt. I advised her that the letter is ready to be mailed and I confirmed pt's address with her. Will place letter in mail today. Pt verbalized understanding.

## 2016-11-07 NOTE — Telephone Encounter (Signed)
Letter signed by Dr. Brett Fairy.

## 2016-11-17 DIAGNOSIS — Z79899 Other long term (current) drug therapy: Secondary | ICD-10-CM | POA: Diagnosis not present

## 2016-11-17 DIAGNOSIS — Z6841 Body Mass Index (BMI) 40.0 and over, adult: Secondary | ICD-10-CM | POA: Diagnosis not present

## 2016-11-17 DIAGNOSIS — Z7189 Other specified counseling: Secondary | ICD-10-CM | POA: Diagnosis not present

## 2016-11-17 DIAGNOSIS — M419 Scoliosis, unspecified: Secondary | ICD-10-CM | POA: Diagnosis not present

## 2016-11-17 DIAGNOSIS — Z23 Encounter for immunization: Secondary | ICD-10-CM | POA: Diagnosis not present

## 2016-11-17 DIAGNOSIS — R5383 Other fatigue: Secondary | ICD-10-CM | POA: Diagnosis not present

## 2016-11-17 DIAGNOSIS — Z0000 Encounter for general adult medical examination without abnormal findings: Secondary | ICD-10-CM | POA: Diagnosis not present

## 2016-11-17 DIAGNOSIS — Z1389 Encounter for screening for other disorder: Secondary | ICD-10-CM | POA: Diagnosis not present

## 2016-11-17 DIAGNOSIS — Z299 Encounter for prophylactic measures, unspecified: Secondary | ICD-10-CM | POA: Diagnosis not present

## 2017-02-01 ENCOUNTER — Telehealth: Payer: Self-pay | Admitting: Neurology

## 2017-02-01 MED ORDER — ALPRAZOLAM 1 MG PO TABS: 0.5000 mg | ORAL_TABLET | Freq: Two times a day (BID) | ORAL | 1 refills | Status: DC

## 2017-02-01 NOTE — Telephone Encounter (Signed)
Pt called said she rec'd a missed call. We discussed MRI referral, she said no one contacted her so she has not had it yet. I advised her it had been sent to GI and to call to schedule it. The appt on 4/16 is for f/u post MRI. Does she need to keep it. Pt said she increased ALPRAZolam (XANAX) 1 MG tablet to 1 tab 2 x day over the past 3 weeks. Said her Grandfather died and the family is fussing, her anxiety has increased. Please call

## 2017-02-01 NOTE — Telephone Encounter (Signed)
I can r/s her appt to after her MRI.   Please see below about xanax. How do you advise?

## 2017-02-01 NOTE — Addendum Note (Signed)
Addended by: Larey Seat on: 02/01/2017 04:32 PM   Modules accepted: Orders

## 2017-02-01 NOTE — Telephone Encounter (Signed)
She needs to keep MRI and she gets 60 Xanax for anxiety. CD

## 2017-02-05 ENCOUNTER — Ambulatory Visit: Payer: Medicare Other | Admitting: Neurology

## 2017-02-06 NOTE — Telephone Encounter (Signed)
I spoke to patient and she is aware that Dr Brett Fairy has written her a short term increase in her medication. Patient is aware that after this prescription she will need to cut back to 0.5mg  twice a day (alprazolam). Patient asked for me to fax into [facility] mail order. Rx faxed.

## 2017-02-15 ENCOUNTER — Ambulatory Visit
Admission: RE | Admit: 2017-02-15 | Discharge: 2017-02-15 | Disposition: A | Discharge status: Home / Self Care | Payer: Medicare HMO | Source: Ambulatory Visit | Attending: Adult Health | Admitting: Adult Health

## 2017-02-15 DIAGNOSIS — G35 Multiple sclerosis: Secondary | ICD-10-CM | POA: Diagnosis not present

## 2017-02-15 MED ORDER — GADOBENATE DIMEGLUMINE 529 MG/ML IV SOLN
20.0000 mL | Freq: Once | INTRAVENOUS | Status: AC | PRN
  Administered 2017-02-15: 20 mL via INTRAVENOUS

## 2017-02-19 ENCOUNTER — Telehealth: Payer: Self-pay

## 2017-02-19 NOTE — Telephone Encounter (Addendum)
I called pt and advised her that there has been no significant change compared with previous MRIs (both brain and cervical spine.) Pt verbalized understanding of results. Pt had no questions at this time but was encouraged to call back if questions arise.

## 2017-02-19 NOTE — Telephone Encounter (Signed)
-----   Message from Ward Givens, NP sent at 02/19/2017  8:06 AM EDT ----- No significant change compared with previous MRI. Please call patient

## 2017-02-26 ENCOUNTER — Telehealth: Payer: Self-pay | Admitting: Neurology

## 2017-02-26 NOTE — Telephone Encounter (Signed)
Loma Sousa from  [place], [place] - [address] [phone] (Phone) [phone] (Fax)    calling for a prior auth on [place], clinical pharmacy review # [phone]   There is a provider option

## 2017-02-27 NOTE — Telephone Encounter (Signed)
I called [employer] and completed a pa for plegridy. Should have a determination in 3-5 business days.

## 2017-03-05 NOTE — Telephone Encounter (Signed)
Plegridy was approved by [employer] until 10/22/2017.

## 2017-04-14 ENCOUNTER — Other Ambulatory Visit: Payer: Self-pay | Admitting: Neurology

## 2017-04-14 DIAGNOSIS — L568 Other specified acute skin changes due to ultraviolet radiation: Secondary | ICD-10-CM

## 2017-04-14 DIAGNOSIS — H81312 Aural vertigo, left ear: Secondary | ICD-10-CM

## 2017-04-14 DIAGNOSIS — G35 Multiple sclerosis: Secondary | ICD-10-CM

## 2017-04-22 ENCOUNTER — Other Ambulatory Visit: Payer: Self-pay | Admitting: Neurology

## 2017-05-09 ENCOUNTER — Ambulatory Visit (INDEPENDENT_AMBULATORY_CARE_PROVIDER_SITE_OTHER): Payer: Medicare HMO | Admitting: Neurology

## 2017-05-09 ENCOUNTER — Encounter: Payer: Self-pay | Admitting: Neurology

## 2017-05-09 VITALS — BP 130/95 | HR 93 | Ht 68.0 in | Wt 268.0 lb

## 2017-05-09 DIAGNOSIS — M539 Dorsopathy, unspecified: Secondary | ICD-10-CM | POA: Diagnosis not present

## 2017-05-09 DIAGNOSIS — Z981 Arthrodesis status: Secondary | ICD-10-CM | POA: Diagnosis not present

## 2017-05-09 DIAGNOSIS — G35 Multiple sclerosis: Secondary | ICD-10-CM

## 2017-05-09 DIAGNOSIS — M5387 Other specified dorsopathies, lumbosacral region: Secondary | ICD-10-CM | POA: Insufficient documentation

## 2017-05-09 DIAGNOSIS — R2689 Other abnormalities of gait and mobility: Secondary | ICD-10-CM | POA: Diagnosis not present

## 2017-05-09 NOTE — Addendum Note (Signed)
Addended by: Larey Seat on: 05/09/2017 04:34 PM   Modules accepted: Orders

## 2017-05-09 NOTE — Progress Notes (Signed)
PATIENT: [name] DOB: 03-08-1981  REASON FOR VISIT: follow up- multiple sclerosis HISTORY FROM: patient  HISTORY OF PRESENT ILLNESS: Today 08/07/2016: [name] is a 36 year old female with a history of multiple sclerosis. She returns today for follow-up. She reports that overall she has remained stable. She continues on Plegridy and tolerates it well. She denies any new numbness or weakness. Denies any changes with her gait or balance. Denies any changes with her vision. She does report changes with her bladder. Reports that she now has to catheterize herself due to urinary retention. She has followed with Dr. Zigmund Daniel and they feel that multiple sclerosis has affected her ability to contract her bladder. Patient's last MRI of the brain and spine was October 2016. At that time there was no change. She returns today for an evaluation.  HISTORY  [facility]): [name] is a 36 year-old caucasian female with history of RRMS.  She returns today for sooner followup, due to diplopia and vertigo. Vertigo worse after mowing the lawn. Today still present. I suspect she is dehydrated.  03-30-15, she continued Plegredy pen and is doing well on it, q 14 days.  Patient was in the past unwilling to try tysabri. She has tried seroquel , which helped her to get to sleep at night and she is avoiding daytime naps. She checks on her grand parents , who are in poor health.   On 08/20/14, she called reporting double vision. This is how her exacerbations have started in the past. Dr. Brett Fairy gave 20 mg prednisone tabs on taper for the next month. She stated that the prednisone pack usually helps but not this time.She is almost finished with this.  No pain, both eyes she states are affected, R side more (states that she can not move the eye all the way over horizontally).The diplopia is giving her vertigo as well.  07/29/14: Started Ampyra, has been only able to tolerate once daily due to  nausea. Feels like it has improved her ability to walk. Her father who is with her states that he has seen a definite improvement in her mobility - but she still has pain in the right thigh. Last visit walking test showed: 54 seconds for 6 times 18 feet Walking with turns. Still complains of memory difficulties. Mood is some better. Nausea on Tecfidera, tolerated only 60 mg Bid with applesauce. Patient had a brain MRI 11-06-13 with acute enhancing lesions, left frontal. Here today to get a 60 mg bid prescription of tecfidera and to discuss Ampyra.   Dr. Benn Moulder mentioned her high degree of sleepiness. Sent for SPLIT study. Patient had a normal PSG, no significant AHi, no PLMs, no RDI and only spontaneous arousals.anxiety/ depression in the diff. diagnosis. She has considered taking stimulant medications but couldn't use these with her heart history. Her Use of Seroquel allowed for deep sleep at a 25 mg daily dose and she feels better.  Until than she still took naps 2 hours in the morning after her son leaves for school, slept from 23.00 hours to 06.30. Epworth sleepiness score no endorsed at 6 points and her fatigue severity score at 57 pints per liter very high. [name] in her last visit performed a Hetland test , on which the patient scored 23/30 points indicating cognitive impairment or depression related to cognitive decline. Generate more than 7 bruits beginning was a certain left, and all he recalled 2/5 recall ports, test was repeated, the patient scored 26/30.today .  Interval history from 04/05/2016, I have seen [name] by now for over a decade. And she brought today some problems that are not strictly neurologic to my attention. She has more frequent but not painful menstrual cycles, she no longer can count on a 28 day interval. I do not think that this is anything to do with her neurologic condition of multiple sclerosis or the treatment thereby. I do think he is to see her  gynecologist. If she has myoma these may also affect her ability to empty her bladder or her urine production. Usually fibromata  cause pain, she denies having any. She has urinary urge and has small volume of urine.    REVIEW OF SYSTEMS: Out of a complete 14 system review of symptoms, the patient complains only of the following symptoms, and all other reviewed systems are negative.  Back pain  ALLERGIES: Allergies  Allergen Reactions  . Propoxyphene N-Acetaminophen Rash    HOME MEDICATIONS: Outpatient Medications Prior to Visit  Medication Sig Dispense Refill  . ALPRAZolam (XANAX) 1 MG tablet Take 0.5 tablets (0.5 mg total) by mouth 2 (two) times daily. 60 tablet 1  . AMPYRA 10 MG TB12 TAKE 1 TABLET BY MOUTH TWICE A DAY 60 tablet 4  . PLEGRIDY 125 MCG/0.5ML SOPN INJECT 125MCG INTO THE SKIN EVERY 14 DAYS 6 pen 2  . ranitidine (ZANTAC) 150 MG tablet Take 150 mg by mouth 2 (two) times daily.     No facility-administered medications prior to visit.     PAST MEDICAL HISTORY: Past Medical History:  Diagnosis Date  . Anxiety   . Depression   . Enlarged heart   . Fibroid 04/24/2016  . GERD (gastroesophageal reflux disease)   . Leaky heart valve   . Menorrhagia with irregular cycle 04/13/2016  . Multiple sclerosis ([place])    Dr Lonzo Cloud  . Multiple sclerosis exacerbation ([place])   . Numbness in right leg 02/13,12/12  . Obesity (BMI 35.0-39.9 without comorbidity)   . Other malaise and fatigue 10/20/2013  . Ovarian cyst 04/24/2016  . Regurgitation    cardiac valve. echo 2013 for first eval GILENYA,mod MR,TR    PAST SURGICAL HISTORY: Past Surgical History:  Procedure Laterality Date  . BACK SURGERY  2006  . CESAREAN SECTION  2000  . CHOLECYSTECTOMY  2010  . COLONOSCOPY  09/14/2010   Fields-hyperplastic polyps, int hemorrhoids  . CYST EXCISION  2006   pilonoidal  . DILITATION & CURRETTAGE/HYSTROSCOPY WITH NOVASURE ABLATION N/A 07/21/2016   Procedure: DILATATION &  CURETTAGE/HYSTEROSCOPY WITH NOVASURE ABLATION;  Surgeon: Jonnie Kind, MD;  Location: [facility];  Service: Gynecology;  Laterality: N/A;  Novasure lenght - 5.0 width - 4.1 power - 113 time - 2 min 03 seconds  . ESOPHAGOGASTRODUODENOSCOPY  08/05/2008   Fields-incomplete Schatzki's ring, distal esophageal erosions/esophagitis(mild), small HH, NEGATIVE h pylori  . LAPAROSCOPIC BILATERAL SALPINGECTOMY Bilateral 07/21/2016   Procedure: LAPAROSCOPIC RIGHT SALPINGECTOMY AND LEFT OOPHECTOMY;  Surgeon: Jonnie Kind, MD;  Location: [facility];  Service: Gynecology;  Laterality: Bilateral;  . US ECHOCARDIOGRAPHY  03/19/2012   RV mildly dilated,RA mod. dilated,strial septum aneurysmal,Mod. MR & TR    FAMILY HISTORY: Family History  Problem Relation Age of Onset  . Other Mother        lost leg, has trouble swallowing  . Thyroid nodules Mother   . Heart attack Father   . Coronary artery disease Father   . Diabetes Father   . Rheum arthritis Father   . Colon cancer  Paternal Uncle 42  . Colon cancer Paternal Grandmother 28  . Diabetes Paternal Grandmother   . Cancer Paternal Grandmother        breast,lung  . Cancer Maternal Grandmother 21       kind unknown  . Diabetes Maternal Grandmother   . Myasthenia gravis Maternal Grandfather   . Other Sister        tumor on ovary  . ADD / ADHD Son   . Heart attack Paternal Grandfather   . Multiple sclerosis Other        paternal great aunt    SOCIAL HISTORY: Social History   Social History  . Marital status: Married    Spouse name: Divorced   . Number of children: 1  . Years of education: college   Occupational History  . disabled    Social History Main Topics  . Smoking status: Former Smoker    Packs/day: 1.00    Years: 20.00    Types: Cigarettes    Quit date: 02/15/2012  . Smokeless tobacco: Never Used  . Alcohol use No  . Drug use: No  . Sexual activity: No   Other Topics Concern  . Not on file   Social History Narrative    Patient is divorced and lives at home and he son lives with her.   Patient has one child.   Patient is disabled.   Patient has some college education.   Patient is right-handed.   Patient drinks 3 servings of caffeine daily            PHYSICAL EXAM  Vitals:   05/09/17 1532  BP: (!) 130/95  Pulse: 93  Weight: 268 lb (121.6 kg)  Height: 5\' 8"  (1.727 m)   Body mass index is 40.75 kg/m.  Generalized: Well developed, in  Distress- pain full, She is morbidly obese,  well groomed.    Neurological examination  Mentation: Alert oriented to time, place, history taking. Follows all commands speech and language fluent Cranial nerve ; there has been no change in her sense of taste or smell. Pupils were dilated- equally round reactive to light and consensual reaction preserved. .  Extraocular movements were full, visual field were full on confrontational test. Facial sensation and strength were normal. Uvula and Tongue in midline, no tremor.  Head turning and shoulder shrug  were normal and symmetric. Motor: normal symmetric tone and bulk.  Sensory:she has numbness in the right hand and tingling in her left hand in all 5 fingers each side. Coordination: she noted a decline in the quality of her handwriting,the numbness in her right hand makes it hard for her to write legible. She also reports cramping and spasms in the hand, sometimes it's hard for her to hold a fork and feed herself. Gait and station: limps on the right , Reflexes: Deep tendon reflexes are symmetric and normal bilaterally.   DIAGNOSTIC DATA (LABS, IMAGING, TESTING) - I reviewed patient records, labs, notes, testing and imaging myself where available.  Lab Results  Component Value Date   WBC 7.1 07/17/2016   HGB 11.3 (L) 07/17/2016   HCT 34.5 (L) 07/17/2016   MCV 76.3 (L) 07/17/2016   PLT 275 07/17/2016   [facility] [address] [place], [place] [address] [phone]  NEUROIMAGING REPORT    STUDY DATE: 02/14/2017 PATIENT NAME: [name] DOB: 01-27-1981 MRN: [id number]  ORDERING CLINICIAN:Megan Clabe Seal, NP CLINICAL HISTORY:  64 year patient with multiple sclerosis COMPARISON FILMS: MRI  C Spine w/wo 08/06/2015 EXAM: MRI Cervical spine w/wo TECHNIQUE: MRI of the cervical spine was obtained utilizing 3 mm sagittal slices from the posterior fossa down to the T3-4 level with T1, T2 and inversion recovery views. In addition 4 mm axial slices from T0-2 down to T1-2 level were included with T2 and gradient echo views. Postcontrast sagittal and axial T1 images were obtained in addition. CONTRAST: 20 ml iv multihance IMAGING SITE: [facility]  FINDINGS: The cervical vertebrae demonstrate slight loss of forward lordotic curvature but normal body height and marrow signal characteristics. The intervertebral discs show mild signal abnormalities but without any frank disc herniation, cord compression, root or foraminal narrowing. The spinal cord parenchyma shows ill-defined hyperintensities on T2/STIR images involving C2-3, C4-5 and C6-7 T1 segments which likely represent a remote age chronic demyelinating plaques. No enhancing lesions are noted after contrast administration. Visualized portion of the lower brainstem also shows ill-defined white matter hyperintensities. Paraspinal soft tissues appear unremarkable.   IMPRESSION:  Abnormal MRI scan of the cervical spine showing ill-defined spinal cord hyperintensities at C2-3, C5-6, C6-7-T1 which likely represent remote age chronic demyelinating plaques. No enhancing lesions are noted. Overall no significant change compared with previous MRI scan dated 08/06/2015   INTERPRETING PHYSICIAN:  Antony Contras, MD  [facility] [address], [place], [place] [address] [phone]  NEUROIMAGING REPORT    STUDY DATE: 02/15/2017 PATIENT NAME: [name] DOB: 07-Apr-1981 MRN: [id number]  ORDERING CLINICIAN: Ward Givens, NP CLINICAL HISTORY: 38 year patient with multiple sclerosis COMPARISON FILMS: MRI Brain 08/06/2015 EXAM: MRI Brain w/wo TECHNIQUE: MRI of the brain with and without contrast was obtained utilizing 5 mm axial slices with T1, T2, T2 flair, T2 star gradient echo and echo planar diffusion weighted views.  T1 and T2 flair sagittal, T2 coronal views and postcontrast views in the axial and coronal plane were obtained. CONTRAST: 20 ml iv multihance IMAGING SITE: [facility] FINDINGS:  The brain parenchyma shows multiple punctate as well as confluent periventricular, subcortical ,brainstem and deep white matter hyperintensities on T2/FLAIR images which are consistent with demyelinating disease. None of these show any postcontrast enhancement. The presence of T 1 black ventricles as well as atrophy of corpus callosum and cortex indicates chronic disease. Overall no significant change compared with previous MRI scan allowing for differences in slice levels and technical differences between the 2 scanners. No structural lesion, tumor infarcts are noted.No abnormal lesions are seen on diffusion-weighted views to suggest acute ischemia. The cortical sulci, fissures and cisterns are normal in size and appearance. Lateral, third and fourth ventricle are normal in size and appearance. No extra-axial fluid collections are seen. No evidence of mass effect or midline shift.  No abnormal lesions are seen on post contrast views.  On sagittal views the posterior fossa, pituitary gland and corpus callosum are unremarkable. No evidence of intracranial hemorrhage on gradient-echo views. There are mild changes of chronic paranasal sinusitis. The orbits and their contents,  and calvarium are unremarkable.  Intracranial flow voids are present.  IMPRESSION: Abnormal MRI scan of the brain showing multiple periventricular, subcortical white matter  hyperintensities compatible with the multiple sclerosis. No enhancing lesions are noted. Presence of T 1 black holes and atrophy of corpus callosum and cortex indicates chronic disease. Incidental chronic paranasal sinusitis is noted. Overall no significant change compared with previous MRI scan dated 08/06/2015.   INTERPRETING PHYSICIAN:  Antony Contras, MD Certified in  Neuroimaging by [employer] and  [employer]     ASSESSMENT AND PLAN 36 y.o. year old female , followed here for MS for many years. Now main concern of pain- back pain, lower back, gluteal region. The patient also describes that she has severe back spasms that sometimes make her arrest in movement. She has tried sitting on a heating pad and some of the capcaicin  Plasters- not successful.  1. Multiple sclerosis. Black holes on brain MRI, multiple cervical spine lesions.  no acute findings. Stay on Plegredy   2. Urinary retention, neurogenic bladder. 3. Limp on the right leg, hip pain and pain radiation from lower back  to the knee.   Overall the patient has remained stable but she has chronic pain- not taking any pain medication by prescription, no narcotics.  . She will continue on Plegridy. Patient recently had blood work that was relatively unremarkable. She will continue follow-up with Dr. Zigmund Daniel.  We will recheck an MRI of the lower back and I will order a hip x-ray if the MRI doesn't explain her pain.. I think that her lower back pain may be an orthopedic issue but I want to make sure. The patient had her last MRI that I can find of record in 2006 prior to having a discectomy surgery.  If there has been a herniation or any hardware failure I will be happy to refer her back to either neurosurgery or orthopedic surgeon.  Patient advised that if her symptoms worsen or she develops new symptoms she should let us know. Follow-up in  months or sooner if needed.  Her  PCP is Dr. Manuella Ghazi, [place] [facility].    05/09/2017, 4:11 PM [facility] [address] [place], [place] [address] [phone]

## 2017-05-22 ENCOUNTER — Ambulatory Visit
Admission: RE | Admit: 2017-05-22 | Discharge: 2017-05-22 | Disposition: A | Discharge status: Home / Self Care | Payer: Medicare HMO | Source: Ambulatory Visit | Attending: Neurology | Admitting: Neurology

## 2017-05-22 DIAGNOSIS — G35 Multiple sclerosis: Secondary | ICD-10-CM

## 2017-05-22 DIAGNOSIS — M48061 Spinal stenosis, lumbar region without neurogenic claudication: Secondary | ICD-10-CM

## 2017-05-22 DIAGNOSIS — R269 Unspecified abnormalities of gait and mobility: Secondary | ICD-10-CM

## 2017-05-22 DIAGNOSIS — M5387 Other specified dorsopathies, lumbosacral region: Secondary | ICD-10-CM

## 2017-05-22 DIAGNOSIS — Z981 Arthrodesis status: Secondary | ICD-10-CM

## 2017-05-22 DIAGNOSIS — M5136 Other intervertebral disc degeneration, lumbar region: Secondary | ICD-10-CM

## 2017-05-22 DIAGNOSIS — R2689 Other abnormalities of gait and mobility: Secondary | ICD-10-CM | POA: Diagnosis not present

## 2017-05-23 ENCOUNTER — Encounter (HOSPITAL_COMMUNITY): Payer: Self-pay

## 2017-05-23 ENCOUNTER — Ambulatory Visit (HOSPITAL_COMMUNITY): Payer: Medicare HMO | Attending: Neurology

## 2017-05-23 DIAGNOSIS — M545 Low back pain: Secondary | ICD-10-CM | POA: Diagnosis present

## 2017-05-23 DIAGNOSIS — R262 Difficulty in walking, not elsewhere classified: Secondary | ICD-10-CM

## 2017-05-23 DIAGNOSIS — R29818 Other symptoms and signs involving the nervous system: Secondary | ICD-10-CM

## 2017-05-23 DIAGNOSIS — R29898 Other symptoms and signs involving the musculoskeletal system: Secondary | ICD-10-CM | POA: Diagnosis present

## 2017-05-23 DIAGNOSIS — G8929 Other chronic pain: Secondary | ICD-10-CM

## 2017-05-23 DIAGNOSIS — M6281 Muscle weakness (generalized): Secondary | ICD-10-CM

## 2017-05-23 DIAGNOSIS — R2681 Unsteadiness on feet: Secondary | ICD-10-CM

## 2017-05-23 NOTE — Therapy (Signed)
[employer] [place], [place], [address] Phone: [phone]   Fax:  [phone]  Physical Therapy Evaluation  Patient Details  Name: [name] MRN: [id number] Date of Birth: 1981-03-21 Referring Provider: Larey Seat, MD  Encounter Date: 05/23/2017      PT End of Session - 05/23/17 1342    Visit Number 1   Number of Visits 9   Date for PT Re-Evaluation 06/20/17   Authorization Type [employer] HMO (Secondary: Medicaid [place] A)   Authorization Time Period 05/23/17 to 06/20/17   Authorization - Visit Number 1   Authorization - Number of Visits 10   PT Start Time 6962   PT Stop Time 1341   PT Time Calculation (min) 38 min   Activity Tolerance Patient tolerated treatment well   Behavior During Therapy [facility] for tasks assessed/performed      Past Medical History:  Diagnosis Date  . Anxiety   . Depression   . Enlarged heart   . Fibroid 04/24/2016  . GERD (gastroesophageal reflux disease)   . Leaky heart valve   . Menorrhagia with irregular cycle 04/13/2016  . Multiple sclerosis ([place])    Dr Lonzo Cloud  . Multiple sclerosis exacerbation ([place])   . Numbness in right leg 02/13,12/12  . Obesity (BMI 35.0-39.9 without comorbidity)   . Other malaise and fatigue 10/20/2013  . Ovarian cyst 04/24/2016  . Regurgitation    cardiac valve. echo 2013 for first eval GILENYA,mod MR,TR    Past Surgical History:  Procedure Laterality Date  . BACK SURGERY  2006  . CESAREAN SECTION  2000  . CHOLECYSTECTOMY  2010  . COLONOSCOPY  09/14/2010   Fields-hyperplastic polyps, int hemorrhoids  . CYST EXCISION  2006   pilonoidal  . DILITATION & CURRETTAGE/HYSTROSCOPY WITH NOVASURE ABLATION N/A 07/21/2016   Procedure: DILATATION & CURETTAGE/HYSTEROSCOPY WITH NOVASURE ABLATION;  Surgeon: Jonnie Kind, MD;  Location: [facility];  Service: Gynecology;  Laterality: N/A;  Novasure lenght - 5.0 width - 4.1 power - 113 time - 2 min 03  seconds  . ESOPHAGOGASTRODUODENOSCOPY  08/05/2008   Fields-incomplete Schatzki's ring, distal esophageal erosions/esophagitis(mild), small HH, NEGATIVE h pylori  . LAPAROSCOPIC BILATERAL SALPINGECTOMY Bilateral 07/21/2016   Procedure: LAPAROSCOPIC RIGHT SALPINGECTOMY AND LEFT OOPHECTOMY;  Surgeon: Jonnie Kind, MD;  Location: [facility];  Service: Gynecology;  Laterality: Bilateral;  . US ECHOCARDIOGRAPHY  03/19/2012   RV mildly dilated,RA mod. dilated,strial septum aneurysmal,Mod. MR & TR    There were no vitals filed for this visit.       Subjective Assessment - 05/23/17 1306    Subjective Pt states that she has been having LBP for the last month. She had lumbar fusion (she thinks L4-5) back in 2006. She states that she wakes up hurting and goes to sleep hurting. Pt was diagnosed with relapsed-remitting MS in 2003 and she is currently in remission; her last episode was several years ago but she has few flare ups every now and then. She states that she walks with a limp due to R leg pain. She reports her LBP is located at midline and across both sides. She has the most difficulty with standing up from sitting and getting up from lying down. She has not had any falls in the last 6 months, but she does get back spasms which cause her to have to stop immediately. She denies any other aggravating factors and denies any relieving factors. She reports infrequent sharp shooting pains down  her legs and reports n/t in her R leg which she feels is from the [place].   How long can you sit comfortably? 45 mins   How long can you stand comfortably? 10 mins   How long can you walk comfortably? 10 mins   Patient Stated Goals to not hurt   Currently in Pain? Yes   Pain Score 7    Pain Location Back   Pain Orientation Lower;Right   Pain Descriptors / Indicators Sharp   Pain Type Chronic pain   Pain Onset More than a month ago   Pain Frequency Constant   Aggravating Factors  standing up from sitting, getting up  from lying down   Pain Relieving Factors none   Effect of Pain on Daily Activities increases            [facility] PT Assessment - 05/23/17 0001      Assessment   Medical Diagnosis MS, multifactoral gait disorder, sciatia of R side associated with disorder or lumbosacral spine, s/p lumbar spinal fusion   Referring Provider Larey Seat, MD   Onset Date/Surgical Date 04/22/17   Next MD Visit 08/07/17   Prior Therapy none for present issue; therapy for hands, balance, gait due to MS     Precautions   Precautions None     Restrictions   Weight Bearing Restrictions No     Balance Screen   Has the patient fallen in the past 6 months No   Has the patient had a decrease in activity level because of a fear of falling?  No   Is the patient reluctant to leave their home because of a fear of falling?  No     Prior Function   Level of Independence Independent;Independent with basic ADLs     Observation/Other Assessments   Focus on Therapeutic Outcomes (FOTO)  47% limitation     Sensation   Light Touch Impaired Detail   Light Touch Impaired Details Impaired RLE   Additional Comments diminished LT thorughout RLE     Posture/Postural Control   Posture/Postural Control Postural limitations   Postural Limitations Rounded Shoulders;Forward head;Increased thoracic kyphosis     ROM / Strength   AROM / PROM / Strength AROM;Strength     AROM   AROM Assessment Site Lumbar   Lumbar Flexion WNL, pain at end range; RFIS x10 reps increase LBP   Lumbar Extension min limitations, pain at end range; REIS x10 reps no change   Lumbar - Right Side Bend WNL, pain on R   Lumbar - Left Side Bend WNL, pulling on R   Lumbar - Right Rotation min limitation, pain on R   Lumbar - Left Rotation WNL     Strength   Right Hip Flexion 5/5   Right Hip Extension 4+/5   Right Hip ABduction 4+/5   Left Hip Flexion 5/5   Left Hip Extension 4+/5   Left Hip ABduction 4+/5   Right Knee Flexion 5/5   Right  Knee Extension 5/5   Left Knee Flexion 5/5   Left Knee Extension 5/5   Right Ankle Dorsiflexion 5/5   Left Ankle Dorsiflexion 5/5     Flexibility   Soft Tissue Assessment /Muscle Length yes   Hamstrings tight BLE, recreated LBP   Quadriceps +Ely's BLE, R recreated LBP     Palpation   Spinal mobility mild hypomobility of lumbar spine, increased tenderness to CPAs at L4-5 and it recreated her pain   Palpation comment increased soft tissue  restrictions of lumbar paraspinals, tenderness to palpation of R lumbar paraspinals and glutes     Ambulation/Gait   Ambulation Distance (Feet) 598 Feet  3MWT   Assistive device None   Gait Pattern Step-through pattern;Decreased stance time - right;Decreased step length - left;Decreased weight shift to right;Trendelenburg;Antalgic;Lateral trunk lean to right   Gait Comments increased LBP with increased time     Balance   Balance Assessed Yes     Static Standing Balance   Static Standing - Balance Support No upper extremity supported   Static Standing Balance -  Activities  Single Leg Stance - Right Leg;Single Leg Stance - Left Leg   Static Standing - Comment/# of Minutes R: 1 sec or < due to RLE pain, not LBP L: 24 sec no UE      Standardized Balance Assessment   Standardized Balance Assessment Five Times Sit to Stand   Five times sit to stand comments  28 sec from chair, no UE, weight shifted to the L         Objective measurements completed on examination: See above findings.           PT Education - 05/23/17 1342    Education provided Yes   Education Details exam findings, POC, HEP   Person(s) Educated Patient   Methods Explanation;Demonstration;Handout   Comprehension Verbalized understanding          PT Short Term Goals - 05/23/17 1342      PT SHORT TERM GOAL #1   Title Pt will be independent with HEP and consistently perform to maximize return to PLOF.   Time 2   Period Weeks   Status New   Target Date 06/06/17      PT SHORT TERM GOAL #2   Title Pt will demonstrate improved flexibility of her quads as demo by a negative Ely's test in order to decrease LBP and maximize overall function.   Time 2   Period Weeks   Status New     PT SHORT TERM GOAL #3   Title Pt will have improved lumbar AROM to WNL to decrease pain and maximize overall function.   Time 2   Period Weeks   Status New     PT SHORT TERM GOAL #4   Title Pt will have improved 3MWT by 138ft and with minimal to no gait deviations and without exacerbation in LBP to demonstrate improved overall function and maximize community participation.   Time 2   Period Weeks   Status New           PT Long Term Goals - 05/23/17 1553      PT LONG TERM GOAL #1   Title Pt will report an improvement in standing tolerance by 50% or > (20 mins or >) to maximize her ability to perform house hold chores.   Time 4   Period Weeks   Status New   Target Date 06/20/17     PT LONG TERM GOAL #2   Title Pt will have report an improvement in walking tolerance by 50% or > (20 mins or >) to maximize her community participation and function.   Time 4   Period Weeks   Status New     PT LONG TERM GOAL #3   Title Pt will have improved MMT to 5/5 of tested muscle groups in order to decrease pain and improve gait and overall function.   Time 4   Period Weeks   Status New  PT LONG TERM GOAL #4   Title Pt will have improved 5xSTS to 15sec or < from chair with no UE, no LBP, and with proper form to demonstrate improved overall functional strength.   Time 4   Period Weeks   Status New                Plan - 05/23/17 1534    Clinical Impression Statement Pt is pleasant 36YO F who presents to OPPT with c/o LBP, on-going for about a month. She has relapsed-remitting MS (currently in remission) and has RLE >LLE deficits at baseline. Pt presently has deficits in MMT, functional strength, gait, balance, AROM, soft tissue restrictions, impaired  flexibility, and increased pain. She also has deficits in sitting posture. Pt states that her RLE has had increased pain/nerve pain when she puts all of her weight through it, which has been going on since before her LBP started, and is not affected by lumbar AROM. Pt would benefit from skilled PT services to address these deficits in order to decrease pain and maximize overall function.   History and Personal Factors relevant to plan of care: MS dx in 2003; Lumbar fusion 2006; young, motivated   Clinical Presentation Stable   Clinical Presentation due to: MMT, functional strength, gait, balance, soft tissue restrictions, AROM, flexibility, pain   Clinical Decision Making Low   Rehab Potential Fair   PT Frequency 2x / week   PT Duration 4 weeks   PT Treatment/Interventions ADLs/Self Care Home Management;Cryotherapy;Electrical Stimulation;Moist Heat;Traction;Gait training;Stair training;Functional mobility training;Therapeutic activities;Therapeutic exercise;Balance training;Neuromuscular re-education;Patient/family education;Manual techniques;Passive range of motion;Dry needling;Energy conservation;Taping   PT Next Visit Plan review goals and HEP, quad stretching, BLE and functional strengthening, balance training (as tolerated with RLE); maual to lumbar paraspinals and CPAs if with PT   PT Home Exercise Plan eval: HS stretch and LTRs   Consulted and Agree with Plan of Care Patient      Patient will benefit from skilled therapeutic intervention in order to improve the following deficits and impairments:  Abnormal gait, Decreased activity tolerance, Decreased balance, Decreased endurance, Decreased range of motion, Decreased strength, Difficulty walking, Hypomobility, Increased muscle spasms, Impaired flexibility, Postural dysfunction, Pain  Visit Diagnosis: Chronic right-sided low back pain without sciatica - Plan: PT plan of care cert/re-cert  Muscle weakness (generalized) - Plan: PT plan of  care cert/re-cert  Difficulty in walking, not elsewhere classified - Plan: PT plan of care cert/re-cert  Unsteadiness on feet - Plan: PT plan of care cert/re-cert  Other symptoms and signs involving the musculoskeletal system - Plan: PT plan of care cert/re-cert  Other symptoms and signs involving the nervous system - Plan: PT plan of care cert/re-cert      G-Codes - 19/50/93 1556    Functional Assessment Tool Used (Outpatient Only) FOTO, clinical judgement, MMT, SLS, 5xSTS, 3MWT   Functional Limitation Mobility: Walking and moving around   Mobility: Walking and Moving Around Current Status (O6712) At least 40 percent but less than 60 percent impaired, limited or restricted   Mobility: Walking and Moving Around Goal Status (W5809) At least 1 percent but less than 20 percent impaired, limited or restricted       Problem List Patient Active Problem List   Diagnosis Date Noted  . Sciatica of right side associated with disorder of lumbosacral spine 05/09/2017  . Status post lumbar spinal fusion 05/09/2017  . Encounter for sterilization education 06/21/2016  . Menorrhagia with regular cycle 06/21/2016  . Fibroid 04/24/2016  .  Ovarian cyst 04/24/2016  . Menorrhagia with irregular cycle 04/13/2016  . Frontal sinusitis 09/30/2015  . Photosensitivity 03/30/2015  . Vertigo, aural 03/30/2015  . Morbid obesity ([place]) 10/19/2014  . Diplopia 10/19/2014  . MS (multiple sclerosis) ([place]) 06/17/2014  . Abnormality of gait 06/17/2014  . Other malaise and fatigue 10/20/2013  . Insomnia 08/06/2013  . Mitral insufficiency 05/17/2013  . Tricuspid insufficiency 05/17/2013  . Multiple sclerosis ([place]) 01/23/2013  . Severe obesity (BMI >= 40) ([place]) 01/23/2013  . 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 Depression with anxiety 01/23/2013  . Anemia 02/22/2012  . Hematochezia 02/22/2012  . Globus sensation 02/22/2012  . Dysphagia 02/22/2012  . GERD 09/01/2010  . CONSTIPATION, CHRONIC 09/01/2010       Geraldine Solar PT, DPT  [facility] [address] [place], [place], [address] Phone: [phone]   Fax:  [phone]  Name: REMEDY CORPORAN MRN: [id number] Date of Birth: Feb 22, 1981

## 2017-05-23 NOTE — Patient Instructions (Signed)
  LOWER TRUNK ROTATIONS - LTR  Lying on your back with your knees bent, gently move your knees side-to-side.  Perform 1x/day, 2-3 sets of 10 reps each side, holding for 3-5 seconds each   HAMSTRING STRETCH WITH TOWEL  While lying down on your back, hook a towel or strap under  your foot and draw up your leg until a stretch is felt under your leg. calf area.  Keep your knee in a straightened position during the stretch.  Perform 1x/day, 3-5 stretches holding for 15-30 sec each

## 2017-05-24 ENCOUNTER — Telehealth: Payer: Self-pay | Admitting: Neurology

## 2017-05-24 NOTE — Telephone Encounter (Signed)
-----   Message from Larey Seat, MD sent at 05/24/2017  1:55 PM EDT ----- Severe degenerative disc disease lower back- I would like to  refer to neurosurgery. Severe left L5  foraminal narrowing. Is patient OK with neurosurgery referral to Dr Laurence Slate etc.

## 2017-05-24 NOTE — Telephone Encounter (Signed)
Called and spoke to the pt in regards to her MRI lumbar spine. I informed the pt of the findings and told her Dr Brett Fairy was recommending a referral to Neurosurgery. Pt was agreeable to that and verbalized understanding.

## 2017-05-25 ENCOUNTER — Other Ambulatory Visit: Payer: Medicare HMO

## 2017-05-28 MED ORDER — TRAMADOL HCL 50 MG PO TABS: 100.0000 mg | ORAL_TABLET | Freq: Four times a day (QID) | ORAL | 0 refills | Status: DC | PRN

## 2017-05-28 NOTE — Addendum Note (Signed)
Addended by: Larey Seat on: 05/28/2017 04:40 PM   Modules accepted: Orders

## 2017-05-28 NOTE — Telephone Encounter (Signed)
Pt states she was told that Dr Brett Fairy would not call in anything for pt until she knew the results of the MRI, with the results now being provided pt would like to know if Dr Dohmeier can call in something for the pain. If something is called in pt asking it be called into [facility], [place] [phone] (Phone) [phone] (Fax)    Please call

## 2017-05-29 ENCOUNTER — Ambulatory Visit (HOSPITAL_COMMUNITY): Payer: Medicare HMO

## 2017-05-29 DIAGNOSIS — M545 Low back pain: Secondary | ICD-10-CM | POA: Diagnosis not present

## 2017-05-29 DIAGNOSIS — R2681 Unsteadiness on feet: Secondary | ICD-10-CM

## 2017-05-29 DIAGNOSIS — M6281 Muscle weakness (generalized): Secondary | ICD-10-CM

## 2017-05-29 DIAGNOSIS — R262 Difficulty in walking, not elsewhere classified: Secondary | ICD-10-CM

## 2017-05-29 DIAGNOSIS — G8929 Other chronic pain: Secondary | ICD-10-CM

## 2017-05-29 DIAGNOSIS — R29898 Other symptoms and signs involving the musculoskeletal system: Secondary | ICD-10-CM

## 2017-05-29 DIAGNOSIS — R29818 Other symptoms and signs involving the nervous system: Secondary | ICD-10-CM

## 2017-05-29 NOTE — Telephone Encounter (Signed)
Referral has been sent to Neurosurgery first available apt  Requested any physician other than [facility] . Patient will be called for an apt buy the end of the week. Telephone [phone] - fax  [phone].

## 2017-05-29 NOTE — Telephone Encounter (Signed)
Patient has apt with Dr. Cyndy Freeze at [facility] 06/01/2017 arrive at 12:45 for 1:30 apt. [place] [facility] has spoke to patient and she is aware.

## 2017-05-29 NOTE — Therapy (Signed)
[place], [place], [address] Phone: [phone]   Fax:  [phone]  Physical Therapy Treatment  Patient Details  Name: [name] MRN: [id number] Date of Birth: 1981/07/21 Referring Provider: Larey Seat, MD  Encounter Date: 05/29/2017      PT End of Session - 05/29/17 1308    Visit Number 2   Number of Visits 9   Date for PT Re-Evaluation 06/20/17   Authorization Type [employer] HMO (Secondary: Medicaid [place] A)   Authorization Time Period 05/23/17 to 06/20/17   Authorization - Visit Number 2   Authorization - Number of Visits 10   PT Start Time 1302   PT Stop Time 1342   PT Time Calculation (min) 40 min   Activity Tolerance Patient tolerated treatment well   Behavior During Therapy [facility] for tasks assessed/performed      Past Medical History:  Diagnosis Date  . Anxiety   . Depression   . Enlarged heart   . Fibroid 04/24/2016  . GERD (gastroesophageal reflux disease)   . Leaky heart valve   . Menorrhagia with irregular cycle 04/13/2016  . Multiple sclerosis ([place])    Dr Lonzo Cloud  . Multiple sclerosis exacerbation ([place])   . Numbness in right leg 02/13,12/12  . Obesity (BMI 35.0-39.9 without comorbidity)   . Other malaise and fatigue 10/20/2013  . Ovarian cyst 04/24/2016  . Regurgitation    cardiac valve. echo 2013 for first eval GILENYA,mod MR,TR    Past Surgical History:  Procedure Laterality Date  . BACK SURGERY  2006  . CESAREAN SECTION  2000  . CHOLECYSTECTOMY  2010  . COLONOSCOPY  09/14/2010   Fields-hyperplastic polyps, int hemorrhoids  . CYST EXCISION  2006   pilonoidal  . DILITATION & CURRETTAGE/HYSTROSCOPY WITH NOVASURE ABLATION N/A 07/21/2016   Procedure: DILATATION & CURETTAGE/HYSTEROSCOPY WITH NOVASURE ABLATION;  Surgeon: Jonnie Kind, MD;  Location: [facility];  Service: Gynecology;  Laterality: N/A;  Novasure lenght - 5.0 width - 4.1 power - 113 time - 2 min 03  seconds  . ESOPHAGOGASTRODUODENOSCOPY  08/05/2008   Fields-incomplete Schatzki's ring, distal esophageal erosions/esophagitis(mild), small HH, NEGATIVE h pylori  . LAPAROSCOPIC BILATERAL SALPINGECTOMY Bilateral 07/21/2016   Procedure: LAPAROSCOPIC RIGHT SALPINGECTOMY AND LEFT OOPHECTOMY;  Surgeon: Jonnie Kind, MD;  Location: [facility];  Service: Gynecology;  Laterality: Bilateral;  . US ECHOCARDIOGRAPHY  03/19/2012   RV mildly dilated,RA mod. dilated,strial septum aneurysmal,Mod. MR & TR    There were no vitals filed for this visit.      Subjective Assessment - 05/29/17 1304    Subjective Pt stated she is limited with LBP.  Reports received MRI findings that included DJD, narrowing of L4-5    Patient Stated Goals to not hurt   Currently in Pain? Yes   Pain Score 4    Pain Location Back   Pain Orientation Lower;Right   Pain Descriptors / Indicators Sharp   Pain Type Chronic pain   Pain Onset More than a month ago   Pain Frequency Constant   Aggravating Factors  standing up from sitting and getting up for lying down   Pain Relieving Factors none   Effect of Pain on Daily Activities significant decrease as fear of increasing pain                         [facility] Adult PT Treatment/Exercise - 05/29/17 0001      Exercises  Exercises Lumbar     Lumbar Exercises: Stretches   Active Hamstring Stretch 3 reps;30 seconds   Lower Trunk Rotation Limitations LTR 10x 10"   Quad Stretch 3 reps;30 seconds     Lumbar Exercises: Supine   Bridge 10 reps   Bridge Limitations RTB around knee, cueing to push down with heel     Manual Therapy   Manual Therapy Soft tissue mobilization   Manual therapy comments Manual complete separate than rest of tx   Soft tissue mobilization Prone SMT to paraspinals, QL and trigger point over sacrum, noted tenderness with Rt PSIS                PT Education - 05/29/17 1429    Education provided Yes   Education Details Reviewed  goals, assured compliance iwth HEP, copy of eval given to pt.   Person(s) Educated Patient   Methods Explanation;Demonstration;Verbal cues;Handout   Comprehension Verbalized understanding;Returned demonstration;Need further instruction          PT Short Term Goals - 05/23/17 1342      PT SHORT TERM GOAL #1   Title Pt will be independent with HEP and consistently perform to maximize return to PLOF.   Time 2   Period Weeks   Status New   Target Date 06/06/17     PT SHORT TERM GOAL #2   Title Pt will demonstrate improved flexibility of her quads as demo by a negative Ely's test in order to decrease LBP and maximize overall function.   Time 2   Period Weeks   Status New     PT SHORT TERM GOAL #3   Title Pt will have improved lumbar AROM to WNL to decrease pain and maximize overall function.   Time 2   Period Weeks   Status New     PT SHORT TERM GOAL #4   Title Pt will have improved 3MWT by 145f and with minimal to no gait deviations and without exacerbation in LBP to demonstrate improved overall function and maximize community participation.   Time 2   Period Weeks   Status New           PT Long Term Goals - 05/23/17 1553      PT LONG TERM GOAL #1   Title Pt will report an improvement in standing tolerance by 50% or > (20 mins or >) to maximize her ability to perform house hold chores.   Time 4   Period Weeks   Status New   Target Date 06/20/17     PT LONG TERM GOAL #2   Title Pt will have report an improvement in walking tolerance by 50% or > (20 mins or >) to maximize her community participation and function.   Time 4   Period Weeks   Status New     PT LONG TERM GOAL #3   Title Pt will have improved MMT to 5/5 of tested muscle groups in order to decrease pain and improve gait and overall function.   Time 4   Period Weeks   Status New     PT LONG TERM GOAL #4   Title Pt will have improved 5xSTS to 15sec or < from chair with no UE, no LBP, and with proper  form to demonstrate improved overall functional strength.   Time 4   Period Weeks   Status New               Plan - 05/29/17 1313  Clinical Impression Statement Reviewed goals, assured compliance and proper form/technique with HEP and copy of eval given to pt.  Pt able to verbalize and demonstrate appropriate form and technqiues with HEP with no cueing or questions.   Session focus on LE flexibility to improve mobility and proximal/core strengthening to assist with LBP.  EOS with manual soft tissue mobilization to address tightness in paraspinals, QL and trigger point release over sacrum.  Noted increased tenderness over Rt PSIS, next session asssess SI alignment with MET and core strengthening if needed.   Rehab Potential Fair   PT Frequency 2x / week   PT Duration 4 weeks   PT Treatment/Interventions ADLs/Self Care Home Management;Cryotherapy;Electrical Stimulation;Moist Heat;Traction;Gait training;Stair training;Functional mobility training;Therapeutic activities;Therapeutic exercise;Balance training;Neuromuscular re-education;Patient/family education;Manual techniques;Passive range of motion;Dry needling;Energy conservation;Taping   PT Next Visit Plan Next session assess SI alignment, MET and core strengthening PRN if findings.  Continue with quad and LE stretches, BLE functional strengthening and balance training (as tolerated with RLE); manual to lumbar paraspinals and CPAs if wiht PT.   PT Home Exercise Plan eval: HS stretch and LTRs      Patient will benefit from skilled therapeutic intervention in order to improve the following deficits and impairments:  Abnormal gait, Decreased activity tolerance, Decreased balance, Decreased endurance, Decreased range of motion, Decreased strength, Difficulty walking, Hypomobility, Increased muscle spasms, Impaired flexibility, Postural dysfunction, Pain  Visit Diagnosis: Chronic right-sided low back pain without sciatica  Muscle weakness  (generalized)  Difficulty in walking, not elsewhere classified  Unsteadiness on feet  Other symptoms and signs involving the musculoskeletal system  Other symptoms and signs involving the nervous system     Problem List Patient Active Problem List   Diagnosis Date Noted  . Sciatica of right side associated with disorder of lumbosacral spine 05/09/2017  . Status post lumbar spinal fusion 05/09/2017  . Encounter for sterilization education 06/21/2016  . Menorrhagia with regular cycle 06/21/2016  . Fibroid 04/24/2016  . Ovarian cyst 04/24/2016  . Menorrhagia with irregular cycle 04/13/2016  . Frontal sinusitis 09/30/2015  . Photosensitivity 03/30/2015  . Vertigo, aural 03/30/2015  . Morbid obesity ([place]) 10/19/2014  . Diplopia 10/19/2014  . MS (multiple sclerosis) ([place]) 06/17/2014  . Abnormality of gait 06/17/2014  . Other malaise and fatigue 10/20/2013  . Insomnia 08/06/2013  . Mitral insufficiency 05/17/2013  . Tricuspid insufficiency 05/17/2013  . Multiple sclerosis ([place]) 01/23/2013  . Severe obesity (BMI >= 40) ([place]) 01/23/2013  . 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 Depression with anxiety 01/23/2013  . Anemia 02/22/2012  . Hematochezia 02/22/2012  . Globus sensation 02/22/2012  . Dysphagia 02/22/2012  . GERD 09/01/2010  . CONSTIPATION, CHRONIC 09/01/2010   Ihor Austin, Man; [place]  Aldona Lento 05/29/2017, 2:30 PM  [place] [address] [place], [place], [address] Phone: [phone]   Fax:  [phone]  Name: [name] MRN: [id number] Date of Birth: April 22, 1981

## 2017-05-31 ENCOUNTER — Encounter (HOSPITAL_COMMUNITY): Payer: Self-pay

## 2017-05-31 ENCOUNTER — Ambulatory Visit (HOSPITAL_COMMUNITY): Payer: Medicare HMO

## 2017-05-31 DIAGNOSIS — R262 Difficulty in walking, not elsewhere classified: Secondary | ICD-10-CM

## 2017-05-31 DIAGNOSIS — G8929 Other chronic pain: Secondary | ICD-10-CM

## 2017-05-31 DIAGNOSIS — R2681 Unsteadiness on feet: Secondary | ICD-10-CM

## 2017-05-31 DIAGNOSIS — M545 Low back pain, unspecified: Secondary | ICD-10-CM

## 2017-05-31 DIAGNOSIS — M6281 Muscle weakness (generalized): Secondary | ICD-10-CM

## 2017-05-31 DIAGNOSIS — R29818 Other symptoms and signs involving the nervous system: Secondary | ICD-10-CM

## 2017-05-31 DIAGNOSIS — R29898 Other symptoms and signs involving the musculoskeletal system: Secondary | ICD-10-CM

## 2017-05-31 NOTE — Therapy (Signed)
[place], [place], [address] Phone: [phone]   Fax:  [phone]  Physical Therapy Treatment  Patient Details  Name: [name] MRN: [id number] Date of Birth: 1981-08-01 Referring Provider: Larey Seat, MD  Encounter Date: 05/31/2017      PT End of Session - 05/31/17 1435    Visit Number 3   Number of Visits 9   Date for PT Re-Evaluation 06/20/17   Authorization Type [employer] HMO (Secondary: Medicaid [place] A)   Authorization Time Period 05/23/17 to 06/20/17   Authorization - Visit Number 3   Authorization - Number of Visits 10   PT Start Time 7564   PT Stop Time 1515   PT Time Calculation (min) 40 min   Activity Tolerance Patient tolerated treatment well   Behavior During Therapy [facility] for tasks assessed/performed      Past Medical History:  Diagnosis Date  . Anxiety   . Depression   . Enlarged heart   . Fibroid 04/24/2016  . GERD (gastroesophageal reflux disease)   . Leaky heart valve   . Menorrhagia with irregular cycle 04/13/2016  . Multiple sclerosis ([place])    Dr Lonzo Cloud  . Multiple sclerosis exacerbation ([place])   . Numbness in right leg 02/13,12/12  . Obesity (BMI 35.0-39.9 without comorbidity)   . Other malaise and fatigue 10/20/2013  . Ovarian cyst 04/24/2016  . Regurgitation    cardiac valve. echo 2013 for first eval GILENYA,mod MR,TR    Past Surgical History:  Procedure Laterality Date  . BACK SURGERY  2006  . CESAREAN SECTION  2000  . CHOLECYSTECTOMY  2010  . COLONOSCOPY  09/14/2010   Fields-hyperplastic polyps, int hemorrhoids  . CYST EXCISION  2006   pilonoidal  . DILITATION & CURRETTAGE/HYSTROSCOPY WITH NOVASURE ABLATION N/A 07/21/2016   Procedure: DILATATION & CURETTAGE/HYSTEROSCOPY WITH NOVASURE ABLATION;  Surgeon: Jonnie Kind, MD;  Location: [facility];  Service: Gynecology;  Laterality: N/A;  Novasure lenght - 5.0 width - 4.1 power - 113 time - 2 min 03  seconds  . ESOPHAGOGASTRODUODENOSCOPY  08/05/2008   Fields-incomplete Schatzki's ring, distal esophageal erosions/esophagitis(mild), small HH, NEGATIVE h pylori  . LAPAROSCOPIC BILATERAL SALPINGECTOMY Bilateral 07/21/2016   Procedure: LAPAROSCOPIC RIGHT SALPINGECTOMY AND LEFT OOPHECTOMY;  Surgeon: Jonnie Kind, MD;  Location: [facility];  Service: Gynecology;  Laterality: Bilateral;  . US ECHOCARDIOGRAPHY  03/19/2012   RV mildly dilated,RA mod. dilated,strial septum aneurysmal,Mod. MR & TR    There were no vitals filed for this visit.      Subjective Assessment - 05/31/17 1435    Subjective Pt states that she is sore today and was sore after her last session. She goes to see her neurosurgeon tomorrow.    Patient Stated Goals to not hurt   Currently in Pain? Yes   Pain Score 3    Pain Location Back   Pain Orientation Lower;Right   Pain Descriptors / Indicators Sharp   Pain Type Chronic pain   Pain Onset More than a month ago   Pain Frequency Constant   Aggravating Factors  standing up from sitting and gettin gup from lying down   Pain Relieving Factors none   Effect of Pain on Daily Activities significant decrease as fear of increasing pain               [facility] Adult PT Treatment/Exercise - 05/31/17 0001      Lumbar Exercises: Stretches   Quad Stretch 3 reps;30  seconds   Quad Stretch Limitations prone with rope     Lumbar Exercises: Supine   Other Supine Lumbar Exercises --   Other Supine Lumbar Exercises Bent knee raise from 6" step with pulldowns with purple band x15 reps     Manual Therapy   Manual Therapy Soft tissue mobilization;Muscle Energy Technique   Manual therapy comments Manual complete separate than rest of tx   Soft tissue mobilization pt in L sidelying, STM to R lumbar paraspinals and QL   Muscle Energy Technique SI MET for R anteriorly rotated innominate x1RT, followed up with walking 1 lap around gym and pt reported decreased LBP                 PT Education - 05/31/17 1516    Education provided Yes   Education Details added prone quad stretch to HEP   Person(s) Educated Patient   Methods Explanation;Demonstration   Comprehension Verbalized understanding;Returned demonstration          PT Short Term Goals - 05/23/17 1342      PT SHORT TERM GOAL #1   Title Pt will be independent with HEP and consistently perform to maximize return to PLOF.   Time 2   Period Weeks   Status New   Target Date 06/06/17     PT SHORT TERM GOAL #2   Title Pt will demonstrate improved flexibility of her quads as demo by a negative Ely's test in order to decrease LBP and maximize overall function.   Time 2   Period Weeks   Status New     PT SHORT TERM GOAL #3   Title Pt will have improved lumbar AROM to WNL to decrease pain and maximize overall function.   Time 2   Period Weeks   Status New     PT SHORT TERM GOAL #4   Title Pt will have improved 3MWT by 149f and with minimal to no gait deviations and without exacerbation in LBP to demonstrate improved overall function and maximize community participation.   Time 2   Period Weeks   Status New           PT Long Term Goals - 05/23/17 1553      PT LONG TERM GOAL #1   Title Pt will report an improvement in standing tolerance by 50% or > (20 mins or >) to maximize her ability to perform house hold chores.   Time 4   Period Weeks   Status New   Target Date 06/20/17     PT LONG TERM GOAL #2   Title Pt will have report an improvement in walking tolerance by 50% or > (20 mins or >) to maximize her community participation and function.   Time 4   Period Weeks   Status New     PT LONG TERM GOAL #3   Title Pt will have improved MMT to 5/5 of tested muscle groups in order to decrease pain and improve gait and overall function.   Time 4   Period Weeks   Status New     PT LONG TERM GOAL #4   Title Pt will have improved 5xSTS to 15sec or < from chair with no UE, no LBP, and with  proper form to demonstrate improved overall functional strength.   Time 4   Period Weeks   Status New               Plan - 05/31/17 1514  Clinical Impression Statement Pt presnted to therapy with c/o 3/10 pain since last session. Assessed SI joint this date and pt noted to have R anteriorly rotated innominate. Performed SI MET for this, followed up with walking 1 lap around gym and pt reported decreased pain to 2/10. Remainder of session focused on stretching, core strengthening, and manual. Pt reported 0/10 pain at EOS. Added prone quad stretch to HEP. Continue POC.   Rehab Potential Fair   PT Frequency 2x / week   PT Duration 4 weeks   PT Treatment/Interventions ADLs/Self Care Home Management;Cryotherapy;Electrical Stimulation;Moist Heat;Traction;Gait training;Stair training;Functional mobility training;Therapeutic activities;Therapeutic exercise;Balance training;Neuromuscular re-education;Patient/family education;Manual techniques;Passive range of motion;Dry needling;Energy conservation;Taping   PT Next Visit Plan Cotninue SI alignment, MET and core strengthening,  Continue with quad and LE stretches, BLE functional strengthening and balance training (as tolerated with RLE); manual to lumbar paraspinals and CPAs if wiht PT.   PT Home Exercise Plan eval: HS stretch and LTRs; 8/9: prone quad stretch   Consulted and Agree with Plan of Care Patient      Patient will benefit from skilled therapeutic intervention in order to improve the following deficits and impairments:  Abnormal gait, Decreased activity tolerance, Decreased balance, Decreased endurance, Decreased range of motion, Decreased strength, Difficulty walking, Hypomobility, Increased muscle spasms, Impaired flexibility, Postural dysfunction, Pain  Visit Diagnosis: Chronic right-sided low back pain without sciatica  Muscle weakness (generalized)  Difficulty in walking, not elsewhere classified  Unsteadiness on  feet  Other symptoms and signs involving the musculoskeletal system  Other symptoms and signs involving the nervous system     Problem List Patient Active Problem List   Diagnosis Date Noted  . Sciatica of right side associated with disorder of lumbosacral spine 05/09/2017  . Status post lumbar spinal fusion 05/09/2017  . Encounter for sterilization education 06/21/2016  . Menorrhagia with regular cycle 06/21/2016  . Fibroid 04/24/2016  . Ovarian cyst 04/24/2016  . Menorrhagia with irregular cycle 04/13/2016  . Frontal sinusitis 09/30/2015  . Photosensitivity 03/30/2015  . Vertigo, aural 03/30/2015  . Morbid obesity ([place]) 10/19/2014  . Diplopia 10/19/2014  . MS (multiple sclerosis) ([place]) 06/17/2014  . Abnormality of gait 06/17/2014  . Other malaise and fatigue 10/20/2013  . Insomnia 08/06/2013  . Mitral insufficiency 05/17/2013  . Tricuspid insufficiency 05/17/2013  . Multiple sclerosis ([place]) 01/23/2013  . Severe obesity (BMI >= 40) ([place]) 01/23/2013  . 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 Depression with anxiety 01/23/2013  . Anemia 02/22/2012  . Hematochezia 02/22/2012  . Globus sensation 02/22/2012  . Dysphagia 02/22/2012  . GERD 09/01/2010  . CONSTIPATION, CHRONIC 09/01/2010      Geraldine Solar PT, DPT  [place] [address] [place], [place], [address] Phone: [phone]   Fax:  [phone]  Name: EARLISHA SHARPLES MRN: [id number] Date of Birth: 1981-08-02

## 2017-06-03 ENCOUNTER — Emergency Department (HOSPITAL_COMMUNITY): Payer: Medicare HMO

## 2017-06-03 ENCOUNTER — Encounter (HOSPITAL_COMMUNITY): Payer: Self-pay | Admitting: Emergency Medicine

## 2017-06-03 ENCOUNTER — Emergency Department (HOSPITAL_COMMUNITY)
Admission: EM | Admit: 2017-06-03 | Discharge: 2017-06-03 | Disposition: A | Discharge status: Home / Self Care | Payer: Medicare HMO | Attending: Emergency Medicine | Admitting: Emergency Medicine

## 2017-06-03 DIAGNOSIS — R072 Precordial pain: Secondary | ICD-10-CM | POA: Diagnosis not present

## 2017-06-03 DIAGNOSIS — Z79899 Other long term (current) drug therapy: Secondary | ICD-10-CM | POA: Insufficient documentation

## 2017-06-03 DIAGNOSIS — R079 Chest pain, unspecified: Secondary | ICD-10-CM | POA: Diagnosis present

## 2017-06-03 DIAGNOSIS — Z87891 Personal history of nicotine dependence: Secondary | ICD-10-CM | POA: Insufficient documentation

## 2017-06-03 LAB — D-DIMER, QUANTITATIVE (NOT AT ARMC): D DIMER QUANT: 0.38 ug{FEU}/mL (ref 0.00–0.50)

## 2017-06-03 LAB — BASIC METABOLIC PANEL
Anion gap: 7 (ref 5–15)
BUN: 13 mg/dL (ref 6–20)
CHLORIDE: 108 mmol/L (ref 101–111)
CO2: 25 mmol/L (ref 22–32)
CREATININE: 0.74 mg/dL (ref 0.44–1.00)
Calcium: 9.2 mg/dL (ref 8.9–10.3)
GFR calc Af Amer: >60 mL/min (ref >60)
GFR calc non Af Amer: >60 mL/min (ref >60)
GLUCOSE: 113 mg/dL — AB (ref 65–99)
POTASSIUM: 3.2 mmol/L — AB (ref 3.5–5.1)
SODIUM: 140 mmol/L (ref 135–145)

## 2017-06-03 LAB — CBC WITH DIFFERENTIAL/PLATELET
Basophils Absolute: 0 10*3/uL (ref 0.0–0.1)
Basophils Relative: 0 %
EOS ABS: 0.1 10*3/uL (ref 0.0–0.7)
Eosinophils Relative: 2 %
HEMATOCRIT: 40.3 % (ref 36.0–46.0)
HEMOGLOBIN: 13.7 g/dL (ref 12.0–15.0)
LYMPHS ABS: 1.9 10*3/uL (ref 0.7–4.0)
LYMPHS PCT: 28 %
MCH: 26.4 pg (ref 26.0–34.0)
MCHC: 34 g/dL (ref 30.0–36.0)
MCV: 77.6 fL — AB (ref 78.0–100.0)
MONOS PCT: 4 %
Monocytes Absolute: 0.3 10*3/uL (ref 0.1–1.0)
NEUTROS PCT: 66 %
Neutro Abs: 4.4 10*3/uL (ref 1.7–7.7)
Platelets: 256 10*3/uL (ref 150–400)
RBC: 5.19 MIL/uL — AB (ref 3.87–5.11)
RDW: 15.2 % (ref 11.5–15.5)
WBC: 6.7 10*3/uL (ref 4.0–10.5)

## 2017-06-03 LAB — TROPONIN I: Troponin I: <0.03 ng/mL (ref <0.03)

## 2017-06-03 MED ORDER — DIPHENHYDRAMINE HCL 25 MG PO CAPS
25.0000 mg | ORAL_CAPSULE | Freq: Once | ORAL | Status: AC
  Administered 2017-06-03: 25 mg via ORAL
  Filled 2017-06-03: qty 1

## 2017-06-03 MED ORDER — ASPIRIN 81 MG PO CHEW
324.0000 mg | CHEWABLE_TABLET | Freq: Once | ORAL | Status: AC
  Administered 2017-06-03: 324 mg via ORAL
  Filled 2017-06-03: qty 4

## 2017-06-03 MED ORDER — DEXAMETHASONE SODIUM PHOSPHATE 10 MG/ML IJ SOLN
10.0000 mg | Freq: Once | INTRAMUSCULAR | Status: AC
  Administered 2017-06-03: 10 mg via INTRAMUSCULAR

## 2017-06-03 MED ORDER — DEXAMETHASONE SODIUM PHOSPHATE 10 MG/ML IJ SOLN
INTRAMUSCULAR | Status: AC
  Filled 2017-06-03: qty 1

## 2017-06-03 NOTE — Discharge Instructions (Signed)
Please stop taking the diclofenac immediately Your heart and lung testing has been normal Benadryl for itching Call your doctor for further guidance on treatment for your muscle / back discomfort. ER for worsening symptoms.

## 2017-06-03 NOTE — ED Provider Notes (Signed)
[facility] DEPT Provider Note   CSN: [id number] Arrival date & time: 06/03/17  [place]     History   Chief Complaint Chief Complaint  Patient presents with  . Pleurisy    HPI [name] is a 36 y.o. female.  HPI  The patient is a 36 year old female with a history of anxiety, multiple sclerosis, chronic back pain and sciatica who presents to the hospital today with a complaint of having some chest pain that started approximately 90 minutes ago while she was in church, heaviness located over the mid chest, no radiation, she was short of breath with this, she did not have any radiation to the shoulders the back or the jaw or the neck. She has not had fevers coughing or swelling of the legs. She is relatively inactive secondary to her multiple sclerosis which leaves her with chronic leg pain. She reports that she has never had chest pain like this before. She has never had a blood clot or any DVT, she has never had any heart problems and has no risk factors including no hypertension diabetes or hypercholesterolemia. She used to smoke cigarettes but stopped approximately 5 years ago. She is still having the pain, it is persistent, it is not made worse with breathing, it is a heaviness in the middle of the chest.  Past Medical History:  Diagnosis Date  . Anxiety   . Depression   . Enlarged heart   . Fibroid 04/24/2016  . GERD (gastroesophageal reflux disease)   . Leaky heart valve   . Menorrhagia with irregular cycle 04/13/2016  . Multiple sclerosis ([place])    Dr Lonzo Cloud  . Multiple sclerosis exacerbation ([place])   . Numbness in right leg 02/13,12/12  . Obesity (BMI 35.0-39.9 without comorbidity)   . Other malaise and fatigue 10/20/2013  . Ovarian cyst 04/24/2016  . Regurgitation    cardiac valve. echo 2013 for first eval GILENYA,mod MR,TR    Patient Active Problem List   Diagnosis Date Noted  . Sciatica of right side associated with disorder of lumbosacral spine 05/09/2017    . Status post lumbar spinal fusion 05/09/2017  . Encounter for sterilization education 06/21/2016  . Menorrhagia with regular cycle 06/21/2016  . Fibroid 04/24/2016  . Ovarian cyst 04/24/2016  . Menorrhagia with irregular cycle 04/13/2016  . Frontal sinusitis 09/30/2015  . Photosensitivity 03/30/2015  . Vertigo, aural 03/30/2015  . Morbid obesity ([place]) 10/19/2014  . Diplopia 10/19/2014  . MS (multiple sclerosis) ([place]) 06/17/2014  . Abnormality of gait 06/17/2014  . Other malaise and fatigue 10/20/2013  . Insomnia 08/06/2013  . Mitral insufficiency 05/17/2013  . Tricuspid insufficiency 05/17/2013  . Multiple sclerosis ([place]) 01/23/2013  . Severe obesity (BMI >= 40) ([place]) 01/23/2013  . 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 Depression with anxiety 01/23/2013  . Anemia 02/22/2012  . Hematochezia 02/22/2012  . Globus sensation 02/22/2012  . Dysphagia 02/22/2012  . GERD 09/01/2010  . CONSTIPATION, CHRONIC 09/01/2010    Past Surgical History:  Procedure Laterality Date  . BACK SURGERY  2006  . CESAREAN SECTION  2000  . CHOLECYSTECTOMY  2010  . COLONOSCOPY  09/14/2010   Fields-hyperplastic polyps, int hemorrhoids  . CYST EXCISION  2006   pilonoidal  . DILITATION & CURRETTAGE/HYSTROSCOPY WITH NOVASURE ABLATION N/A 07/21/2016   Procedure: DILATATION & CURETTAGE/HYSTEROSCOPY WITH NOVASURE ABLATION;  Surgeon: Jonnie Kind, MD;  Location: [facility];  Service: Gynecology;  Laterality: N/A;  Novasure lenght - 5.0 width - 4.1 power - 113 time -  2 min 03 seconds  . ESOPHAGOGASTRODUODENOSCOPY  08/05/2008   Fields-incomplete Schatzki's ring, distal esophageal erosions/esophagitis(mild), small HH, NEGATIVE h pylori  . LAPAROSCOPIC BILATERAL SALPINGECTOMY Bilateral 07/21/2016   Procedure: LAPAROSCOPIC RIGHT SALPINGECTOMY AND LEFT OOPHECTOMY;  Surgeon: Jonnie Kind, MD;  Location: [facility];  Service: Gynecology;  Laterality: Bilateral;  . LUMBAR DISC SURGERY    . US ECHOCARDIOGRAPHY   03/19/2012   RV mildly dilated,RA mod. dilated,strial septum aneurysmal,Mod. MR & TR    OB History    Gravida Para Term Preterm AB Living   1 1       1    SAB TAB Ectopic Multiple Live Births                   Home Medications    Prior to Admission medications   Medication Sig Start Date End Date Taking? Authorizing Provider  ALPRAZolam Duanne Moron) 1 MG tablet Take 0.5 tablets (0.5 mg total) by mouth 2 (two) times daily. 02/01/17  Yes Dohmeier, Asencion Partridge, MD  AMPYRA 10 MG TB12 TAKE 1 TABLET BY MOUTH TWICE A DAY 04/23/17  Yes Dohmeier, Asencion Partridge, MD  ranitidine (ZANTAC) 150 MG tablet Take 150 mg by mouth 2 (two) times daily.   Yes [provider]  traMADol (ULTRAM) 50 MG tablet Take 2 tablets (100 mg total) by mouth every 6 (six) hours as needed for severe pain. 05/28/17  Yes Dohmeier, Asencion Partridge, MD  PLEGRIDY 125 MCG/0.5ML SOPN INJECT 125MCG INTO THE SKIN EVERY 14 DAYS 04/16/17   Dohmeier, Asencion Partridge, MD    Family History Family History  Problem Relation Age of Onset  . Other Mother        lost leg, has trouble swallowing  . Thyroid nodules Mother   . Heart attack Father   . Coronary artery disease Father   . Diabetes Father   . Rheum arthritis Father   . Colon cancer Paternal Uncle 39  . Colon cancer Paternal Grandmother 88  . Diabetes Paternal Grandmother   . Cancer Paternal Grandmother        breast,lung  . Cancer Maternal Grandmother 51       kind unknown  . Diabetes Maternal Grandmother   . Myasthenia gravis Maternal Grandfather   . Other Sister        tumor on ovary  . ADD / ADHD Son   . Heart attack Paternal Grandfather   . Multiple sclerosis Other        paternal great aunt    Social History Social History  Substance Use Topics  . Smoking status: Former Smoker    Packs/day: 1.00    Years: 20.00    Types: Cigarettes    Quit date: 02/15/2012  . Smokeless tobacco: Never Used  . Alcohol use No     Allergies   Propoxyphene n-acetaminophen   Review of  Systems Review of Systems  All other systems reviewed and are negative.    Physical Exam Updated Vital Signs BP 140/84 (BP Location: Left Arm)   Pulse 84   Temp 97.8 F (36.6 C) (Oral)   Resp 18   Ht 5\' 8"  (1.727 m)   Wt 122 kg (269 lb)   LMP 08/23/2016   SpO2 98%   BMI 40.90 kg/m   Physical Exam  Constitutional: She appears well-developed and well-nourished. No distress.  HENT:  Head: Normocephalic and atraumatic.  Mouth/Throat: Oropharynx is clear and moist. No oropharyngeal exudate.  Eyes: Pupils are equal, round, and reactive to light. Conjunctivae and EOM  are normal. Right eye exhibits no discharge. Left eye exhibits no discharge. No scleral icterus.  Neck: Normal range of motion. Neck supple. No JVD present. No thyromegaly present.  Cardiovascular: Normal rate, regular rhythm, normal heart sounds and intact distal pulses.  Exam reveals no gallop and no friction rub.   No murmur heard. Pulmonary/Chest: Effort normal and breath sounds normal. No respiratory distress. She has no wheezes. She has no rales.  Abdominal: Soft. Bowel sounds are normal. She exhibits no distension and no mass. There is no tenderness.  Musculoskeletal: Normal range of motion. She exhibits no edema or tenderness.  Lymphadenopathy:    She has no cervical adenopathy.  Neurological: She is alert. Coordination normal.  Skin: Skin is warm and dry. No rash noted. No erythema.  Psychiatric: She has a normal mood and affect. Her behavior is normal.  Nursing note and vitals reviewed.    ED Treatments / Results  Labs (all labs ordered are listed, but only abnormal results are displayed) Labs Reviewed  CBC WITH DIFFERENTIAL/PLATELET - Abnormal; Notable for the following:       Result Value   RBC 5.19 (*)    MCV 77.6 (*)    All other components within normal limits  BASIC METABOLIC PANEL - Abnormal; Notable for the following:    Potassium 3.2 (*)    Glucose, Bld 113 (*)    All other components  within normal limits  TROPONIN I  D-DIMER, QUANTITATIVE (NOT AT [facility])    EKG  EKG Interpretation  Date/Time:  Sunday June 03 2017 18:54:58 EDT Ventricular Rate:  71 PR Interval:    QRS Duration: 116 QT Interval:  431 QTC Calculation: 469 R Axis:   49 Text Interpretation:  Sinus rhythm Incomplete right bundle branch block since last tracing no significant change Confirmed by Noemi Chapel [id number]) on 06/03/2017 7:13:45 PM       Radiology Dg Chest 2 View  Result Date: 06/03/2017 CLINICAL DATA:  Chest pain EXAM: CHEST  2 VIEW COMPARISON:  03/02/2015 FINDINGS: The heart size and mediastinal contours are within normal limits. Both lungs are clear. The visualized skeletal structures are unremarkable. IMPRESSION: No active cardiopulmonary disease. Electronically Signed   By: Franchot Gallo M.D.   On: 06/03/2017 20:17    Procedures Procedures (including critical care time)  Medications Ordered in ED Medications  dexamethasone (DECADRON) injection 10 mg (not administered)  dexamethasone (DECADRON) 10 MG/ML injection (not administered)  aspirin chewable tablet 324 mg (324 mg Oral Given 06/03/17 1933)  diphenhydrAMINE (BENADRYL) capsule 25 mg (25 mg Oral Given 06/03/17 1949)     Initial Impression / Assessment and Plan / ED Course  I have reviewed the triage vital signs and the nursing notes.  Pertinent labs & imaging results that were available during my care of the patient were reviewed by me and considered in my medical decision making (see chart for details).    Has CP - very few RF for PE or ACS - d dimer pending, ECG unchanged ASA ordered  Chest pain has totally resolved after a short time, labs reviewed showing normal d-dimer, normal troponin, normal blood counts and metabolic panel. Chest x-ray also unremarkable, this was discussed with the patient and her family members at the patient's request. The patient believes that this new symptom is related to starting diclofenac  which her doctor has given her for her new back pain and spasms. She will stop this medication immediately, she will follow-up in the outpatient setting, she is  low risk for acute coronary syndrome and is very low on the heart score. Stable for discharge at this time. She is symptom-free at discharge.  Final Clinical Impressions(s) / ED Diagnoses   Final diagnoses:  Precordial pain    New Prescriptions Current Discharge Medication List       Noemi Chapel, MD 06/03/17 2052

## 2017-06-03 NOTE — ED Triage Notes (Signed)
Per EMS pt was in church and was having trouble breathing and substernal chest pain.  Vital signs are stable, lung sounds clear.  Pt started new medication on Friday diclofenac.  Pt states that she is experiencing chest tightness and pressure.

## 2017-06-05 ENCOUNTER — Ambulatory Visit (HOSPITAL_COMMUNITY): Payer: Medicare HMO | Admitting: Physical Therapy

## 2017-06-05 DIAGNOSIS — M545 Low back pain, unspecified: Secondary | ICD-10-CM

## 2017-06-05 DIAGNOSIS — R2681 Unsteadiness on feet: Secondary | ICD-10-CM

## 2017-06-05 DIAGNOSIS — R262 Difficulty in walking, not elsewhere classified: Secondary | ICD-10-CM

## 2017-06-05 DIAGNOSIS — G8929 Other chronic pain: Secondary | ICD-10-CM

## 2017-06-05 DIAGNOSIS — M6281 Muscle weakness (generalized): Secondary | ICD-10-CM

## 2017-06-05 NOTE — Therapy (Signed)
[facility] [place], [place], [address] Phone: [phone]   Fax:  [phone]  Physical Therapy Treatment  Patient Details  Name: [name] MRN: [id number] Date of Birth: 22-Aug-1981 Referring Provider: Larey Seat, MD  Encounter Date: 06/05/2017      PT End of Session - 06/05/17 1555    Visit Number 4   Number of Visits 9   Date for PT Re-Evaluation 06/20/17   Authorization Type [employer] HMO (Secondary: Medicaid [place] A)   Authorization Time Period 05/23/17 to 06/20/17   Authorization - Visit Number 4   Authorization - Number of Visits 10   PT Start Time 1520   PT Stop Time 1600   PT Time Calculation (min) 40 min   Activity Tolerance Patient tolerated treatment well   Behavior During Therapy [facility] for tasks assessed/performed      Past Medical History:  Diagnosis Date  . Anxiety   . Depression   . Enlarged heart   . Fibroid 04/24/2016  . GERD (gastroesophageal reflux disease)   . Leaky heart valve   . Menorrhagia with irregular cycle 04/13/2016  . Multiple sclerosis ([place])    Dr Lonzo Cloud  . Multiple sclerosis exacerbation ([place])   . Numbness in right leg 02/13,12/12  . Obesity (BMI 35.0-39.9 without comorbidity)   . Other malaise and fatigue 10/20/2013  . Ovarian cyst 04/24/2016  . Regurgitation    cardiac valve. echo 2013 for first eval GILENYA,mod MR,TR    Past Surgical History:  Procedure Laterality Date  . BACK SURGERY  2006  . CESAREAN SECTION  2000  . CHOLECYSTECTOMY  2010  . COLONOSCOPY  09/14/2010   Fields-hyperplastic polyps, int hemorrhoids  . CYST EXCISION  2006   pilonoidal  . DILITATION & CURRETTAGE/HYSTROSCOPY WITH NOVASURE ABLATION N/A 07/21/2016   Procedure: DILATATION & CURETTAGE/HYSTEROSCOPY WITH NOVASURE ABLATION;  Surgeon: Jonnie Kind, MD;  Location: [facility];  Service: Gynecology;  Laterality: N/A;  Novasure lenght - 5.0 width - 4.1 power - 113 time - 2 min 03  seconds  . ESOPHAGOGASTRODUODENOSCOPY  08/05/2008   Fields-incomplete Schatzki's ring, distal esophageal erosions/esophagitis(mild), small HH, NEGATIVE h pylori  . LAPAROSCOPIC BILATERAL SALPINGECTOMY Bilateral 07/21/2016   Procedure: LAPAROSCOPIC RIGHT SALPINGECTOMY AND LEFT OOPHECTOMY;  Surgeon: Jonnie Kind, MD;  Location: [facility];  Service: Gynecology;  Laterality: Bilateral;  . LUMBAR DISC SURGERY    . US ECHOCARDIOGRAPHY  03/19/2012   RV mildly dilated,RA mod. dilated,strial septum aneurysmal,Mod. MR & TR    There were no vitals filed for this visit.      Subjective Assessment - 06/05/17 1556    Subjective Pt states she had to go to the ED Sunday due to anaphylactic shock from new med from neurosurgeon.  States neurosurgeon changed med to flexoril.  States she currently has no pain.   Currently in Pain? No/denies                         [facility] Adult PT Treatment/Exercise - 06/05/17 0001      Lumbar Exercises: Stretches   Quad Stretch 3 reps;30 seconds   Quad Stretch Limitations prone with rope     Lumbar Exercises: Standing   Other Standing Lumbar Exercises wall arch 10 reps   Other Standing Lumbar Exercises seated scap retractions     Manual Therapy   Manual Therapy Muscle Energy Technique   Manual therapy comments completed first, Manual complete separate than  rest of tx   Muscle Energy Technique SI MET for LT anteriorly rotated innominate x1RT, followed up with walking 1 lap around gym and pt reported decreased LBP                PT Education - 06/05/17 1622    Education provided Yes   Education Details postural education, new exercise instruction   Person(s) Educated Patient   Methods Explanation;Demonstration;Tactile cues;Verbal cues   Comprehension Verbalized understanding;Returned demonstration;Verbal cues required;Tactile cues required          PT Short Term Goals - 05/23/17 1342      PT SHORT TERM GOAL #1   Title Pt will be  independent with HEP and consistently perform to maximize return to PLOF.   Time 2   Period Weeks   Status New   Target Date 06/06/17     PT SHORT TERM GOAL #2   Title Pt will demonstrate improved flexibility of her quads as demo by a negative Ely's test in order to decrease LBP and maximize overall function.   Time 2   Period Weeks   Status New     PT SHORT TERM GOAL #3   Title Pt will have improved lumbar AROM to WNL to decrease pain and maximize overall function.   Time 2   Period Weeks   Status New     PT SHORT TERM GOAL #4   Title Pt will have improved 3MWT by 131f and with minimal to no gait deviations and without exacerbation in LBP to demonstrate improved overall function and maximize community participation.   Time 2   Period Weeks   Status New           PT Long Term Goals - 05/23/17 1553      PT LONG TERM GOAL #1   Title Pt will report an improvement in standing tolerance by 50% or > (20 mins or >) to maximize her ability to perform house hold chores.   Time 4   Period Weeks   Status New   Target Date 06/20/17     PT LONG TERM GOAL #2   Title Pt will have report an improvement in walking tolerance by 50% or > (20 mins or >) to maximize her community participation and function.   Time 4   Period Weeks   Status New     PT LONG TERM GOAL #3   Title Pt will have improved MMT to 5/5 of tested muscle groups in order to decrease pain and improve gait and overall function.   Time 4   Period Weeks   Status New     PT LONG TERM GOAL #4   Title Pt will have improved 5xSTS to 15sec or < from chair with no UE, no LBP, and with proper form to demonstrate improved overall functional strength.   Time 4   Period Weeks   Status New               Plan - 06/05/17 1625    Clinical Impression Statement Pt presented with no pain this session as she had taken flexoril, which have been working well.  SI rotated anteriorly on Lt today and completed MET to correct  this.  Began postural education and added therex to address this.  Pt remained painfree throughout session.    Rehab Potential Fair   PT Frequency 2x / week   PT Duration 4 weeks   PT Treatment/Interventions ADLs/Self Care Home Management;Cryotherapy;Electrical Stimulation;Moist  Heat;Traction;Gait training;Stair training;Functional mobility training;Therapeutic activities;Therapeutic exercise;Balance training;Neuromuscular re-education;Patient/family education;Manual techniques;Passive range of motion;Dry needling;Energy conservation;Taping   PT Next Visit Plan Continue to check SI alignment and complete MET as needed.  Progress core and postural strengthening.  Next session begin postural theraband exercises and add BLE functional strengthening in standing (lunges, step ups, etc).  Progress to balance training (as tolerated with RLE); manual to lumbar paraspinals and CPAs if with PT.   PT Home Exercise Plan eval: HS stretch and LTRs; 8/9: prone quad stretch   Consulted and Agree with Plan of Care Patient      Patient will benefit from skilled therapeutic intervention in order to improve the following deficits and impairments:  Abnormal gait, Decreased activity tolerance, Decreased balance, Decreased endurance, Decreased range of motion, Decreased strength, Difficulty walking, Hypomobility, Increased muscle spasms, Impaired flexibility, Postural dysfunction, Pain  Visit Diagnosis: Chronic right-sided low back pain without sciatica  Muscle weakness (generalized)  Difficulty in walking, not elsewhere classified  Unsteadiness on feet     Problem List Patient Active Problem List   Diagnosis Date Noted  . Sciatica of right side associated with disorder of lumbosacral spine 05/09/2017  . Status post lumbar spinal fusion 05/09/2017  . Encounter for sterilization education 06/21/2016  . Menorrhagia with regular cycle 06/21/2016  . Fibroid 04/24/2016  . Ovarian cyst 04/24/2016  . Menorrhagia  with irregular cycle 04/13/2016  . Frontal sinusitis 09/30/2015  . Photosensitivity 03/30/2015  . Vertigo, aural 03/30/2015  . Morbid obesity ([place]) 10/19/2014  . Diplopia 10/19/2014  . MS (multiple sclerosis) ([place]) 06/17/2014  . Abnormality of gait 06/17/2014  . Other malaise and fatigue 10/20/2013  . Insomnia 08/06/2013  . Mitral insufficiency 05/17/2013  . Tricuspid insufficiency 05/17/2013  . Multiple sclerosis ([place]) 01/23/2013  . Severe obesity (BMI >= 40) ([place]) 01/23/2013  . 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 Depression with anxiety 01/23/2013  . Anemia 02/22/2012  . Hematochezia 02/22/2012  . Globus sensation 02/22/2012  . Dysphagia 02/22/2012  . GERD 09/01/2010  . CONSTIPATION, CHRONIC 09/01/2010   [name], PTA/CLT [phone]   [name] 06/05/2017, 4:28 PM  [facility] [address] [place], [place], [address] Phone: [phone]   Fax:  [phone]  Name: [name] MRN: [id number] Date of Birth: 10-23-1981

## 2017-06-07 ENCOUNTER — Ambulatory Visit (HOSPITAL_COMMUNITY): Payer: Medicare HMO

## 2017-06-07 ENCOUNTER — Telehealth: Payer: Self-pay | Admitting: Neurology

## 2017-06-07 ENCOUNTER — Encounter (HOSPITAL_COMMUNITY): Payer: Self-pay

## 2017-06-07 ENCOUNTER — Other Ambulatory Visit: Payer: Self-pay | Admitting: Neurology

## 2017-06-07 DIAGNOSIS — R29898 Other symptoms and signs involving the musculoskeletal system: Secondary | ICD-10-CM

## 2017-06-07 DIAGNOSIS — M545 Low back pain, unspecified: Secondary | ICD-10-CM

## 2017-06-07 DIAGNOSIS — M6281 Muscle weakness (generalized): Secondary | ICD-10-CM

## 2017-06-07 DIAGNOSIS — R2681 Unsteadiness on feet: Secondary | ICD-10-CM

## 2017-06-07 DIAGNOSIS — M5136 Other intervertebral disc degeneration, lumbar region: Secondary | ICD-10-CM

## 2017-06-07 DIAGNOSIS — R262 Difficulty in walking, not elsewhere classified: Secondary | ICD-10-CM

## 2017-06-07 DIAGNOSIS — G8929 Other chronic pain: Secondary | ICD-10-CM

## 2017-06-07 DIAGNOSIS — R29818 Other symptoms and signs involving the nervous system: Secondary | ICD-10-CM

## 2017-06-07 NOTE — Patient Instructions (Signed)
  BRIDGING ELASTIC BAND ABDUCTION  While lying on your back, place an elastic band around your knees and pull your knees apart. Hold this and then tighten your lower abdominals, squeeze your buttocks and raise your buttocks off the floor/bed as creating a "Bridge" with your body.  Perform 1x/day, 2-3 sets of 10-15 reps with green band   Clamshell with theraband  Place theraband above the knee and lie on your side. Spread legs and make sure not to roll backwards.  Its all about form...can lie against wall or sofa to prevent rolling  Perform 1x/day, 2-3 sets of 10-15 reps with green band

## 2017-06-07 NOTE — Therapy (Signed)
[name], [place], [address] Phone: [phone]   Fax:  [phone]  Physical Therapy Treatment  Patient Details  Name: [name] MRN: [id number] Date of Birth: 1980-10-24 Referring Provider: Larey Seat, MD  Encounter Date: 06/07/2017      PT End of Session - 06/07/17 1431    Visit Number 5   Number of Visits 9   Date for PT Re-Evaluation 06/20/17   Authorization Type [employer] Medicare HMO (Secondary: Medicaid [place] A)   Authorization Time Period 05/23/17 to 06/20/17   Authorization - Visit Number 4   Authorization - Number of Visits 10   PT Start Time 9450   PT Stop Time 1510   PT Time Calculation (min) 39 min   Activity Tolerance Patient tolerated treatment well   Behavior During Therapy [facility] for tasks assessed/performed      Past Medical History:  Diagnosis Date  . Anxiety   . Depression   . Enlarged heart   . Fibroid 04/24/2016  . GERD (gastroesophageal reflux disease)   . Leaky heart valve   . Menorrhagia with irregular cycle 04/13/2016  . Multiple sclerosis ([place])    Dr Lonzo Cloud  . Multiple sclerosis exacerbation ([place])   . Numbness in right leg 02/13,12/12  . Obesity (BMI 35.0-39.9 without comorbidity)   . Other malaise and fatigue 10/20/2013  . Ovarian cyst 04/24/2016  . Regurgitation    cardiac valve. echo 2013 for first eval GILENYA,mod MR,TR    Past Surgical History:  Procedure Laterality Date  . BACK SURGERY  2006  . CESAREAN SECTION  2000  . CHOLECYSTECTOMY  2010  . COLONOSCOPY  09/14/2010   Fields-hyperplastic polyps, int hemorrhoids  . CYST EXCISION  2006   pilonoidal  . DILITATION & CURRETTAGE/HYSTROSCOPY WITH NOVASURE ABLATION N/A 07/21/2016   Procedure: DILATATION & CURETTAGE/HYSTEROSCOPY WITH NOVASURE ABLATION;  Surgeon: Jonnie Kind, MD;  Location: [facility];  Service: Gynecology;  Laterality: N/A;  Novasure lenght - 5.0 width - 4.1 power - 113 time - 2 min 03  seconds  . ESOPHAGOGASTRODUODENOSCOPY  08/05/2008   Fields-incomplete Schatzki's ring, distal esophageal erosions/esophagitis(mild), small HH, NEGATIVE h pylori  . LAPAROSCOPIC BILATERAL SALPINGECTOMY Bilateral 07/21/2016   Procedure: LAPAROSCOPIC RIGHT SALPINGECTOMY AND LEFT OOPHECTOMY;  Surgeon: Jonnie Kind, MD;  Location: [facility];  Service: Gynecology;  Laterality: Bilateral;  . LUMBAR DISC SURGERY    . US ECHOCARDIOGRAPHY  03/19/2012   RV mildly dilated,RA mod. dilated,strial septum aneurysmal,Mod. MR & TR    There were no vitals filed for this visit.      Subjective Assessment - 06/07/17 1431    Subjective Pt states that she is still tired and hasn't gotten her energy back from her hospital stay. She reports she is going to see Dr. Carloyn Manner about her back pain.   Currently in Pain? Yes   Pain Score 5    Pain Location Back   Pain Orientation Lower;Right   Pain Descriptors / Indicators Sharp   Pain Type Chronic pain   Pain Onset More than a month ago   Pain Frequency Constant   Aggravating Factors  standing up from sitting and getting up from lying down   Pain Relieving Factors none   Effect of Pain on Daily Activities increases              [facility] Adult PT Treatment/Exercise - 06/07/17 0001      Lumbar Exercises: Stretches   Quadruped Mid Back Stretch  Limitations child's pose with L lateral trunk lean 2x30 second     Lumbar Exercises: Standing   Other Standing Lumbar Exercises SLS on foam 10x10" on L   Other Standing Lumbar Exercises half kneeling and lifts with RTB 2x10 on L, x10 on R     Lumbar Exercises: Supine   Bridge 15 reps   Bridge Limitations GTB around knees,    Other Supine Lumbar Exercises deadbugs x15 reps each with diaphragmatic breathing     Lumbar Exercises: Sidelying   Clam 15 reps   Clam Limitations GTB     Manual Therapy   Manual Therapy Muscle Energy Technique   Manual therapy comments completed first, Manual complete separate than rest of tx    Muscle Energy Technique SI MET for LT anteriorly rotated innominate x1RT, followed up with walking 1 lap around gym and pt reported decreased LBP               PT Education - 06/07/17 1510    Education provided Yes   Education Details updated HEP, perform repeated L LF over the weekend if her LBP increases    Person(s) Educated Patient   Methods Explanation;Demonstration;Handout   Comprehension Verbalized understanding;Returned demonstration          PT Short Term Goals - 05/23/17 1342      PT SHORT TERM GOAL #1   Title Pt will be independent with HEP and consistently perform to maximize return to PLOF.   Time 2   Period Weeks   Status New   Target Date 06/06/17     PT SHORT TERM GOAL #2   Title Pt will demonstrate improved flexibility of her quads as demo by a negative Ely's test in order to decrease LBP and maximize overall function.   Time 2   Period Weeks   Status New     PT SHORT TERM GOAL #3   Title Pt will have improved lumbar AROM to WNL to decrease pain and maximize overall function.   Time 2   Period Weeks   Status New     PT SHORT TERM GOAL #4   Title Pt will have improved 3MWT by 120f and with minimal to no gait deviations and without exacerbation in LBP to demonstrate improved overall function and maximize community participation.   Time 2   Period Weeks   Status New           PT Long Term Goals - 05/23/17 1553      PT LONG TERM GOAL #1   Title Pt will report an improvement in standing tolerance by 50% or > (20 mins or >) to maximize her ability to perform house hold chores.   Time 4   Period Weeks   Status New   Target Date 06/20/17     PT LONG TERM GOAL #2   Title Pt will have report an improvement in walking tolerance by 50% or > (20 mins or >) to maximize her community participation and function.   Time 4   Period Weeks   Status New     PT LONG TERM GOAL #3   Title Pt will have improved MMT to 5/5 of tested muscle groups in  order to decrease pain and improve gait and overall function.   Time 4   Period Weeks   Status New     PT LONG TERM GOAL #4   Title Pt will have improved 5xSTS to 15sec or < from chair with no  UE, no LBP, and with proper form to demonstrate improved overall functional strength.   Time 4   Period Weeks   Status New               Plan - 06/07/17 1511    Clinical Impression Statement Assessed pt's SI joint at beginning of session and she was anteriorly rotated on the L again this date. Performed SI MET and she reported reduction of pain to 2/10 compared to 5/10 at beginning of session. Followed up with BLE and progressed core strengthening this date. She did well, not c/o LBP throughout all of therex except for during lifts in half kneeling. Had pt perform repeated L LF to open her R facet joints and pt reported reduction in symptoms back to a 2/10. Educated pt to perform this repeated motion over the weekend if her LBP increases and she verbalized understanding.   Rehab Potential Fair   PT Frequency 2x / week   PT Duration 4 weeks   PT Treatment/Interventions ADLs/Self Care Home Management;Cryotherapy;Electrical Stimulation;Moist Heat;Traction;Gait training;Stair training;Functional mobility training;Therapeutic activities;Therapeutic exercise;Balance training;Neuromuscular re-education;Patient/family education;Manual techniques;Passive range of motion;Dry needling;Energy conservation;Taping   PT Next Visit Plan Continue to check SI alignment and complete MET as needed.  Progress core and postural strengthening.  Next session begin postural theraband exercises and add BLE functional strengthening in standing (lunges, step ups, etc).  Progress to balance training (as tolerated with RLE); manual to lumbar paraspinals as needed   PT Home Exercise Plan eval: HS stretch and LTRs; 8/9: prone quad stretch; 8/16: bridges with band, clamshells with GTB, repeated L LF in standing   Consulted and  Agree with Plan of Care Patient      Patient will benefit from skilled therapeutic intervention in order to improve the following deficits and impairments:  Abnormal gait, Decreased activity tolerance, Decreased balance, Decreased endurance, Decreased range of motion, Decreased strength, Difficulty walking, Hypomobility, Increased muscle spasms, Impaired flexibility, Postural dysfunction, Pain  Visit Diagnosis: Chronic right-sided low back pain without sciatica  Muscle weakness (generalized)  Difficulty in walking, not elsewhere classified  Unsteadiness on feet  Other symptoms and signs involving the musculoskeletal system  Other symptoms and signs involving the nervous system     Problem List Patient Active Problem List   Diagnosis Date Noted  . Sciatica of right side associated with disorder of lumbosacral spine 05/09/2017  . Status post lumbar spinal fusion 05/09/2017  . Encounter for sterilization education 06/21/2016  . Menorrhagia with regular cycle 06/21/2016  . Fibroid 04/24/2016  . Ovarian cyst 04/24/2016  . Menorrhagia with irregular cycle 04/13/2016  . Frontal sinusitis 09/30/2015  . Photosensitivity 03/30/2015  . Vertigo, aural 03/30/2015  . Morbid obesity ([place]) 10/19/2014  . Diplopia 10/19/2014  . MS (multiple sclerosis) ([place]) 06/17/2014  . Abnormality of gait 06/17/2014  . Other malaise and fatigue 10/20/2013  . Insomnia 08/06/2013  . Mitral insufficiency 05/17/2013  . Tricuspid insufficiency 05/17/2013  . Multiple sclerosis ([place]) 01/23/2013  . Severe obesity (BMI >= 40) ([place]) 01/23/2013  . 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 Depression with anxiety 01/23/2013  . Anemia 02/22/2012  . Hematochezia 02/22/2012  . Globus sensation 02/22/2012  . Dysphagia 02/22/2012  . GERD 09/01/2010  . CONSTIPATION, CHRONIC 09/01/2010     Geraldine Solar PT, DPT  [place] [address] [place], [place],  [address] Phone: [phone]   Fax:  [phone]  Name: JOANETTE SILVERIA MRN: [id number] Date of Birth: [birth year]

## 2017-06-07 NOTE — Telephone Encounter (Signed)
Called and spoke with the patient. Pt explained she was not happy with the neurosurgeon that she was referred to. She stated that he didn't seem to have any interest in fixing her back. Pt would like a second referral and would like for it to be Dr Carloyn Manner in [place]. I have ordered that and sent it to our referrals department to get the patient in with his office.

## 2017-06-07 NOTE — Telephone Encounter (Signed)
Spoke to patient and she is aware I have sent referral to Dr. Glenna Fellows Neurosurgeon . Patient want have to take her MRI CD's  Because Dr. Glenna Fellows has excess to [facility] to view.  Sent via Fax to Dr. Elta Guadeloupe Roy's office telephone [phone] fax [phone].

## 2017-06-07 NOTE — Telephone Encounter (Signed)
Patient is calling in reference to seeing Dr. Cyndy Freeze last Friday and being prescribed Diclofenac sodium 75mg .  Patient had a bad reaction to the medication on 06/03/17.  Patient contacted Dr. Hewitt Shorts office and advised patient to DC the medication, and was switched to flexeril.  Patient was hoping to not have any more medications to take and was hoping to have a different outcome at the visit.  Patient would like to know if she is able to have a referral for a second opinion.  Please call

## 2017-06-12 ENCOUNTER — Encounter (HOSPITAL_COMMUNITY): Payer: Self-pay

## 2017-06-12 ENCOUNTER — Ambulatory Visit (HOSPITAL_COMMUNITY): Payer: Medicare HMO

## 2017-06-12 DIAGNOSIS — R2681 Unsteadiness on feet: Secondary | ICD-10-CM

## 2017-06-12 DIAGNOSIS — R262 Difficulty in walking, not elsewhere classified: Secondary | ICD-10-CM

## 2017-06-12 DIAGNOSIS — M545 Low back pain, unspecified: Secondary | ICD-10-CM

## 2017-06-12 DIAGNOSIS — R29818 Other symptoms and signs involving the nervous system: Secondary | ICD-10-CM

## 2017-06-12 DIAGNOSIS — G8929 Other chronic pain: Secondary | ICD-10-CM

## 2017-06-12 DIAGNOSIS — R29898 Other symptoms and signs involving the musculoskeletal system: Secondary | ICD-10-CM

## 2017-06-12 DIAGNOSIS — M6281 Muscle weakness (generalized): Secondary | ICD-10-CM

## 2017-06-12 NOTE — Therapy (Signed)
[place], [place], [address] Phone: [phone]   Fax:  [phone]  Physical Therapy Treatment  Patient Details  Name: [name] MRN: [id number] Date of Birth: 1981-09-07 Referring Provider: Larey Seat, MD  Encounter Date: 06/12/2017      PT End of Session - 06/12/17 1346    Visit Number 6   Number of Visits 9   Date for PT Re-Evaluation 06/20/17   Authorization Type [employer] Medicare HMO (Secondary: Medicaid [place] A)   Authorization Time Period 05/23/17 to 06/20/17   Authorization - Visit Number 6   Authorization - Number of Visits 10   PT Start Time 1346   PT Stop Time 1425   PT Time Calculation (min) 39 min   Activity Tolerance Patient tolerated treatment well   Behavior During Therapy [facility] for tasks assessed/performed      Past Medical History:  Diagnosis Date  . Anxiety   . Depression   . Enlarged heart   . Fibroid 04/24/2016  . GERD (gastroesophageal reflux disease)   . Leaky heart valve   . Menorrhagia with irregular cycle 04/13/2016  . Multiple sclerosis ([place])    Dr Lonzo Cloud  . Multiple sclerosis exacerbation ([place])   . Numbness in right leg 02/13,12/12  . Obesity (BMI 35.0-39.9 without comorbidity)   . Other malaise and fatigue 10/20/2013  . Ovarian cyst 04/24/2016  . Regurgitation    cardiac valve. echo 2013 for first eval GILENYA,mod MR,TR    Past Surgical History:  Procedure Laterality Date  . BACK SURGERY  2006  . CESAREAN SECTION  2000  . CHOLECYSTECTOMY  2010  . COLONOSCOPY  09/14/2010   Fields-hyperplastic polyps, int hemorrhoids  . CYST EXCISION  2006   pilonoidal  . DILITATION & CURRETTAGE/HYSTROSCOPY WITH NOVASURE ABLATION N/A 07/21/2016   Procedure: DILATATION & CURETTAGE/HYSTEROSCOPY WITH NOVASURE ABLATION;  Surgeon: Jonnie Kind, MD;  Location: [facility];  Service: Gynecology;  Laterality: N/A;  Novasure lenght - 5.0 width - 4.1 power - 113 time - 2 min 03  seconds  . ESOPHAGOGASTRODUODENOSCOPY  08/05/2008   Fields-incomplete Schatzki's ring, distal esophageal erosions/esophagitis(mild), small HH, NEGATIVE h pylori  . LAPAROSCOPIC BILATERAL SALPINGECTOMY Bilateral 07/21/2016   Procedure: LAPAROSCOPIC RIGHT SALPINGECTOMY AND LEFT OOPHECTOMY;  Surgeon: Jonnie Kind, MD;  Location: [facility];  Service: Gynecology;  Laterality: Bilateral;  . LUMBAR DISC SURGERY    . US ECHOCARDIOGRAPHY  03/19/2012   RV mildly dilated,RA mod. dilated,strial septum aneurysmal,Mod. MR & TR    There were no vitals filed for this visit.      Subjective Assessment - 06/12/17 1346    Subjective Pt states that her back is hurting pretty good today. She states that she was waiting for a tow truck for an hour earlier today so she was walking around and moving to not stand still.    Currently in Pain? Yes   Pain Score 7    Pain Location Back   Pain Orientation Lower;Right   Pain Descriptors / Indicators Sharp   Pain Type Chronic pain   Pain Onset More than a month ago   Pain Frequency Constant   Aggravating Factors  standing up from sitting and getting up from lying down   Pain Relieving Factors none   Effect of Pain on Daily Activities increases              [facility] Adult PT Treatment/Exercise - 06/12/17 0001      Lumbar Exercises:  Stretches   Quadruped Mid Back Stretch Limitations child's pose with L lateral trunk lean 2x30 second     Lumbar Exercises: Standing   Other Standing Lumbar Exercises REIS x20 reps; hip abd with GTB x10 reps each; sidesteppig 15fx2RT with GTB   Other Standing Lumbar Exercises unilateral farmer's carry with 10# DB x2laps around gym each     Manual Therapy   Manual Therapy Muscle Energy Technique;Soft tissue mobilization   Manual therapy comments completed first, Manual complete separate than rest of tx   Soft tissue mobilization pt in L sidelying, STM to R lumbar paraspinals and QL   Muscle Energy Technique SI MET for LT  anteriorly rotated innominate x1RT, followed up with walking 1 lap around gym and pt reported decreased LBP              PT Education - 06/12/17 1429    Education provided Yes   Education Details add REIS x20 reps every 2-3 hours at home along with repeated L LF; self soft tissue mobilization with tennis ball to address R lumbar paraspinal restrictions   Person(s) Educated Patient   Methods Explanation;Demonstration   Comprehension Verbalized understanding;Returned demonstration            PT Short Term Goals - 05/23/17 1342      PT SHORT TERM GOAL #1   Title Pt will be independent with HEP and consistently perform to maximize return to PLOF.   Time 2   Period Weeks   Status New   Target Date 06/06/17     PT SHORT TERM GOAL #2   Title Pt will demonstrate improved flexibility of her quads as demo by a negative Ely's test in order to decrease LBP and maximize overall function.   Time 2   Period Weeks   Status New     PT SHORT TERM GOAL #3   Title Pt will have improved lumbar AROM to WNL to decrease pain and maximize overall function.   Time 2   Period Weeks   Status New     PT SHORT TERM GOAL #4   Title Pt will have improved 3MWT by 1010fand with minimal to no gait deviations and without exacerbation in LBP to demonstrate improved overall function and maximize community participation.   Time 2   Period Weeks   Status New           PT Long Term Goals - 05/23/17 1553      PT LONG TERM GOAL #1   Title Pt will report an improvement in standing tolerance by 50% or > (20 mins or >) to maximize her ability to perform house hold chores.   Time 4   Period Weeks   Status New   Target Date 06/20/17     PT LONG TERM GOAL #2   Title Pt will have report an improvement in walking tolerance by 50% or > (20 mins or >) to maximize her community participation and function.   Time 4   Period Weeks   Status New     PT LONG TERM GOAL #3   Title Pt will have improved  MMT to 5/5 of tested muscle groups in order to decrease pain and improve gait and overall function.   Time 4   Period Weeks   Status New     PT LONG TERM GOAL #4   Title Pt will have improved 5xSTS to 15sec or < from chair with no UE, no LBP, and with  proper form to demonstrate improved overall functional strength.   Time 4   Period Weeks   Status New               Plan - 06/12/17 1426    Clinical Impression Statement Pt presented to therapy with c/o increased LBP after having stood and walked around her driveway for a few hours after having to wait for a tow truck to come to her house. Began session by assessing SI and pt still noted to have increased anterior rotation on L; SI MET performed again and pt reported minimal changes to pain. Followed up with manual to R lumbar paraspinals and pt reported reduction of pain to 4/10. Ended session with core and hip strengthening; pt did not report an exacerbation of LBP, just hip fatigue. Will continue POC as planned.   Rehab Potential Fair   PT Frequency 2x / week   PT Duration 4 weeks   PT Treatment/Interventions ADLs/Self Care Home Management;Cryotherapy;Electrical Stimulation;Moist Heat;Traction;Gait training;Stair training;Functional mobility training;Therapeutic activities;Therapeutic exercise;Balance training;Neuromuscular re-education;Patient/family education;Manual techniques;Passive range of motion;Dry needling;Energy conservation;Taping   PT Next Visit Plan Continue to check SI alignment and complete MET as needed.  Progress core and postural strengthening.  Begin postural theraband exercises and add BLE functional strengthening in standing.  Progress to balance training (as tolerated with RLE); manual to lumbar paraspinals as needed   PT Home Exercise Plan eval: HS stretch and LTRs; 8/9: prone quad stretch; 8/16: bridges with band, clamshells with GTB, repeated L LF in standing   Consulted and Agree with Plan of Care Patient       Patient will benefit from skilled therapeutic intervention in order to improve the following deficits and impairments:  Abnormal gait, Decreased activity tolerance, Decreased balance, Decreased endurance, Decreased range of motion, Decreased strength, Difficulty walking, Hypomobility, Increased muscle spasms, Impaired flexibility, Postural dysfunction, Pain  Visit Diagnosis: Chronic right-sided low back pain without sciatica  Muscle weakness (generalized)  Difficulty in walking, not elsewhere classified  Unsteadiness on feet  Other symptoms and signs involving the musculoskeletal system  Other symptoms and signs involving the nervous system     Problem List Patient Active Problem List   Diagnosis Date Noted  . Sciatica of right side associated with disorder of lumbosacral spine 05/09/2017  . Status post lumbar spinal fusion 05/09/2017  . Encounter for sterilization education 06/21/2016  . Menorrhagia with regular cycle 06/21/2016  . Fibroid 04/24/2016  . Ovarian cyst 04/24/2016  . Menorrhagia with irregular cycle 04/13/2016  . Frontal sinusitis 09/30/2015  . Photosensitivity 03/30/2015  . Vertigo, aural 03/30/2015  . Morbid obesity ([place]) 10/19/2014  . Diplopia 10/19/2014  . MS (multiple sclerosis) ([place]) 06/17/2014  . Abnormality of gait 06/17/2014  . Other malaise and fatigue 10/20/2013  . Insomnia 08/06/2013  . Mitral insufficiency 05/17/2013  . Tricuspid insufficiency 05/17/2013  . Multiple sclerosis ([place]) 01/23/2013  . Severe obesity (BMI >= 40) ([place]) 01/23/2013  . 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 Depression with anxiety 01/23/2013  . Anemia 02/22/2012  . Hematochezia 02/22/2012  . Globus sensation 02/22/2012  . Dysphagia 02/22/2012  . GERD 09/01/2010  . CONSTIPATION, CHRONIC 09/01/2010      Geraldine Solar PT, DPT  [place] [address] [place], [place], [address] Phone: [phone]   Fax:   [phone]  Name: [name] MRN: [id number] Date of Birth: [birth year]

## 2017-06-14 ENCOUNTER — Other Ambulatory Visit: Payer: Self-pay | Admitting: Neurology

## 2017-06-14 ENCOUNTER — Encounter (HOSPITAL_COMMUNITY): Payer: Self-pay

## 2017-06-14 ENCOUNTER — Ambulatory Visit (HOSPITAL_COMMUNITY): Payer: Medicare HMO

## 2017-06-14 DIAGNOSIS — M545 Low back pain, unspecified: Secondary | ICD-10-CM

## 2017-06-14 DIAGNOSIS — M6281 Muscle weakness (generalized): Secondary | ICD-10-CM

## 2017-06-14 DIAGNOSIS — R2681 Unsteadiness on feet: Secondary | ICD-10-CM

## 2017-06-14 DIAGNOSIS — R262 Difficulty in walking, not elsewhere classified: Secondary | ICD-10-CM

## 2017-06-14 DIAGNOSIS — R29898 Other symptoms and signs involving the musculoskeletal system: Secondary | ICD-10-CM

## 2017-06-14 DIAGNOSIS — G8929 Other chronic pain: Secondary | ICD-10-CM

## 2017-06-14 DIAGNOSIS — R29818 Other symptoms and signs involving the nervous system: Secondary | ICD-10-CM

## 2017-06-14 NOTE — Therapy (Signed)
[place], [place], [address] Phone: [phone]   Fax:  [phone]  Physical Therapy Treatment  Patient Details  Name: [name] MRN: [id number] Date of Birth: 30-Jul-1981 Referring Provider: Larey Seat, MD  Encounter Date: 06/14/2017      PT End of Session - 06/14/17 1348    Visit Number 7   Number of Visits 9   Date for PT Re-Evaluation 06/20/17   Authorization Type [employer] Medicare HMO (Secondary: Medicaid [place] A)   Authorization Time Period 05/23/17 to 06/20/17   Authorization - Visit Number 7   Authorization - Number of Visits 10   PT Start Time 6440   PT Stop Time 1428   PT Time Calculation (min) 39 min   Activity Tolerance Patient tolerated treatment well   Behavior During Therapy [facility] for tasks assessed/performed      Past Medical History:  Diagnosis Date  . Anxiety   . Depression   . Enlarged heart   . Fibroid 04/24/2016  . GERD (gastroesophageal reflux disease)   . Leaky heart valve   . Menorrhagia with irregular cycle 04/13/2016  . Multiple sclerosis ([place])    Dr Lonzo Cloud  . Multiple sclerosis exacerbation ([place])   . Numbness in right leg 02/13,12/12  . Obesity (BMI 35.0-39.9 without comorbidity)   . Other malaise and fatigue 10/20/2013  . Ovarian cyst 04/24/2016  . Regurgitation    cardiac valve. echo 2013 for first eval GILENYA,mod MR,TR    Past Surgical History:  Procedure Laterality Date  . BACK SURGERY  2006  . CESAREAN SECTION  2000  . CHOLECYSTECTOMY  2010  . COLONOSCOPY  09/14/2010   Fields-hyperplastic polyps, int hemorrhoids  . CYST EXCISION  2006   pilonoidal  . DILITATION & CURRETTAGE/HYSTROSCOPY WITH NOVASURE ABLATION N/A 07/21/2016   Procedure: DILATATION & CURETTAGE/HYSTEROSCOPY WITH NOVASURE ABLATION;  Surgeon: Jonnie Kind, MD;  Location: [facility];  Service: Gynecology;  Laterality: N/A;  Novasure lenght - 5.0 width - 4.1 power - 113 time - 2 min 03  seconds  . ESOPHAGOGASTRODUODENOSCOPY  08/05/2008   Fields-incomplete Schatzki's ring, distal esophageal erosions/esophagitis(mild), small HH, NEGATIVE h pylori  . LAPAROSCOPIC BILATERAL SALPINGECTOMY Bilateral 07/21/2016   Procedure: LAPAROSCOPIC RIGHT SALPINGECTOMY AND LEFT OOPHECTOMY;  Surgeon: Jonnie Kind, MD;  Location: [facility];  Service: Gynecology;  Laterality: Bilateral;  . LUMBAR DISC SURGERY    . US ECHOCARDIOGRAPHY  03/19/2012   RV mildly dilated,RA mod. dilated,strial septum aneurysmal,Mod. MR & TR    There were no vitals filed for this visit.      Subjective Assessment - 06/14/17 1348    Subjective Pt states that she is not too bad today, rates her pain as 2/10 today.    Currently in Pain? Yes   Pain Score 2    Pain Location Back   Pain Orientation Lower;Right   Pain Descriptors / Indicators Aching   Pain Type Chronic pain   Pain Onset More than a month ago   Pain Frequency Constant   Aggravating Factors  standing up from sitting and getting up from lying down   Pain Relieving Factors none   Effect of Pain on Daily Activities increases               [facility] Adult PT Treatment/Exercise - 06/14/17 0001      Lumbar Exercises: Stretches   Pelvic Tilt Limitations corner stretch 3x30 sec     Lumbar Exercises: Standing   Scapular  Retraction Strengthening;20 reps   Theraband Level (Scapular Retraction) Level 3 (Green)   Row Strengthening;10 reps   Theraband Level (Row) Level 3 (Green)   Other Standing Lumbar Exercises Y's on wall with liftoff and RTB 2x10 reps   Other Standing Lumbar Exercises L SLS on foam and pulldowns with GTB x20 reps     Lumbar Exercises: Seated   LAQ on Ball Limitations 3D thoracic excursions x10 reps each     Lumbar Exercises: Supine   Other Supine Lumbar Exercises uni deadbugs with physioball 2x10 reps each   Other Supine Lumbar Exercises Bent knee raise from 4" step with pulldowns with purple band 3x10 reps     Lumbar Exercises:  Quadruped   Opposite Arm/Leg Raise Right arm/Left leg;Left arm/Right leg;10 reps     Manual Therapy   Muscle Energy Technique SI MET not indicated this session              PT Education - 06/14/17 1429    Education provided Yes   Education Details updated HEP, self pelvic mobilization   Person(s) Educated Patient   Methods Explanation;Demonstration;Handout   Comprehension Verbalized understanding;Returned demonstration          PT Short Term Goals - 05/23/17 1342      PT SHORT TERM GOAL #1   Title Pt will be independent with HEP and consistently perform to maximize return to PLOF.   Time 2   Period Weeks   Status New   Target Date 06/06/17     PT SHORT TERM GOAL #2   Title Pt will demonstrate improved flexibility of her quads as demo by a negative Ely's test in order to decrease LBP and maximize overall function.   Time 2   Period Weeks   Status New     PT SHORT TERM GOAL #3   Title Pt will have improved lumbar AROM to WNL to decrease pain and maximize overall function.   Time 2   Period Weeks   Status New     PT SHORT TERM GOAL #4   Title Pt will have improved 3MWT by 168f and with minimal to no gait deviations and without exacerbation in LBP to demonstrate improved overall function and maximize community participation.   Time 2   Period Weeks   Status New           PT Long Term Goals - 05/23/17 1553      PT LONG TERM GOAL #1   Title Pt will report an improvement in standing tolerance by 50% or > (20 mins or >) to maximize her ability to perform house hold chores.   Time 4   Period Weeks   Status New   Target Date 06/20/17     PT LONG TERM GOAL #2   Title Pt will have report an improvement in walking tolerance by 50% or > (20 mins or >) to maximize her community participation and function.   Time 4   Period Weeks   Status New     PT LONG TERM GOAL #3   Title Pt will have improved MMT to 5/5 of tested muscle groups in order to decrease pain  and improve gait and overall function.   Time 4   Period Weeks   Status New     PT LONG TERM GOAL #4   Title Pt will have improved 5xSTS to 15sec or < from chair with no UE, no LBP, and with proper form to demonstrate improved overall  functional strength.   Time 4   Period Weeks   Status New               Plan - 06/14/17 1430    Clinical Impression Statement Began session with SI assessment and MET not indicated this date as pt's pelvic felt symmetrical. Rest of session focused on core stabilization and postural education and strengthening. Pt did well throughout session, only having some difficulty with birddogs due to poor core strength. Initiated postural strengthening and awareness this date and pt verbalized understanding. Educated on lumbar roll and performing stretches to improve thoracic ROM. Continue POC as planned.   Rehab Potential Fair   PT Frequency 2x / week   PT Duration 4 weeks   PT Treatment/Interventions ADLs/Self Care Home Management;Cryotherapy;Electrical Stimulation;Moist Heat;Traction;Gait training;Stair training;Functional mobility training;Therapeutic activities;Therapeutic exercise;Balance training;Neuromuscular re-education;Patient/family education;Manual techniques;Passive range of motion;Dry needling;Energy conservation;Taping   PT Next Visit Plan Continue to check SI alignment and complete MET as needed.  Progress core and postural strengthening. Continue postural theraband exercises and add BLE functional strengthening in standing.  Progress to balance training (as tolerated with RLE); manual to lumbar paraspinals as needed   PT Home Exercise Plan eval: HS stretch and LTRs; 8/9: prone quad stretch; 8/16: bridges with band, clamshells with GTB, repeated L LF in standing; 8/23: birddogs, bil low rows, scap retraction, y's on wall with lift off, corner stretch   Consulted and Agree with Plan of Care Patient      Patient will benefit from skilled therapeutic  intervention in order to improve the following deficits and impairments:  Abnormal gait, Decreased activity tolerance, Decreased balance, Decreased endurance, Decreased range of motion, Decreased strength, Difficulty walking, Hypomobility, Increased muscle spasms, Impaired flexibility, Postural dysfunction, Pain  Visit Diagnosis: Chronic right-sided low back pain without sciatica  Muscle weakness (generalized)  Difficulty in walking, not elsewhere classified  Unsteadiness on feet  Other symptoms and signs involving the musculoskeletal system  Other symptoms and signs involving the nervous system     Problem List Patient Active Problem List   Diagnosis Date Noted  . Sciatica of right side associated with disorder of lumbosacral spine 05/09/2017  . Status post lumbar spinal fusion 05/09/2017  . Encounter for sterilization education 06/21/2016  . Menorrhagia with regular cycle 06/21/2016  . Fibroid 04/24/2016  . Ovarian cyst 04/24/2016  . Menorrhagia with irregular cycle 04/13/2016  . Frontal sinusitis 09/30/2015  . Photosensitivity 03/30/2015  . Vertigo, aural 03/30/2015  . Morbid obesity ([place]) 10/19/2014  . Diplopia 10/19/2014  . MS (multiple sclerosis) ([place]) 06/17/2014  . Abnormality of gait 06/17/2014  . Other malaise and fatigue 10/20/2013  . Insomnia 08/06/2013  . Mitral insufficiency 05/17/2013  . Tricuspid insufficiency 05/17/2013  . Multiple sclerosis ([place]) 01/23/2013  . Severe obesity (BMI >= 40) ([place]) 01/23/2013  . 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 Depression with anxiety 01/23/2013  . Anemia 02/22/2012  . Hematochezia 02/22/2012  . Globus sensation 02/22/2012  . Dysphagia 02/22/2012  . GERD 09/01/2010  . CONSTIPATION, CHRONIC 09/01/2010      Geraldine Solar PT, DPT  [place] [address] [place], [place], [address] Phone: [phone]   Fax:  [phone]  Name: [name] MRN: [id number] Date  of Birth: Mar 19, 1981

## 2017-06-14 NOTE — Patient Instructions (Signed)
  QUADRUPED ALTERNATE ARM AND LEG - BIRD DOG  While in a crawling position, brace at your abdominals and then slowly lift a leg and opposite arm upwards.   Maintain a level and stable pelvis and spine the entire time.    Perform 1x/day, 2-3 sets of 10 reps each   wall slides shoulder flexion with lift off  place forearms on wall, shoulders at 90 degrees in front of you slowly slide arms up wall without moving your lower back, keeping your head level and neck relaxed only move arms as far as able to maintain posture and without pain, at the top of the motion, lift arms off wall, moving them backward by tilting shoulder blades back and down  Perform 1x/day, 2-3 sets of 10 reps each   ELASTIC BAND SCAPULAR RETRACTIONS WITH MINI SHOULDER EXTENSIONS  While holding an elastic band with both arms in front of you with your elbows straight, squeeze your shoulder blades together as you pull the band back. Be sure your shoulders do not raise up.    Perform 1x/day, 2-3 sets of 10 reps each   Scapular Retraction  Start: Position your arm at 90 degrees by your side with Theraband in hand as pictured.  Movement: Against the resistance of the band, squeeze your shoulder blades together as you stick your chest out. Slow and controlled movement. return to start position.  *Note-you should not be pulling with your arms, this will only round your shoulders. The movement we want her is initiated by your shoulder blades, your arms are just holding the resistance.  Perform 1x/day, 2-3 sets of 10 reps each

## 2017-06-19 ENCOUNTER — Encounter (HOSPITAL_COMMUNITY): Payer: Self-pay

## 2017-06-19 ENCOUNTER — Ambulatory Visit (HOSPITAL_COMMUNITY): Payer: Medicare HMO

## 2017-06-19 DIAGNOSIS — R29898 Other symptoms and signs involving the musculoskeletal system: Secondary | ICD-10-CM

## 2017-06-19 DIAGNOSIS — M545 Low back pain, unspecified: Secondary | ICD-10-CM

## 2017-06-19 DIAGNOSIS — R262 Difficulty in walking, not elsewhere classified: Secondary | ICD-10-CM

## 2017-06-19 DIAGNOSIS — R2681 Unsteadiness on feet: Secondary | ICD-10-CM

## 2017-06-19 DIAGNOSIS — R29818 Other symptoms and signs involving the nervous system: Secondary | ICD-10-CM

## 2017-06-19 DIAGNOSIS — G8929 Other chronic pain: Secondary | ICD-10-CM

## 2017-06-19 DIAGNOSIS — M6281 Muscle weakness (generalized): Secondary | ICD-10-CM

## 2017-06-19 NOTE — Therapy (Signed)
[place], [place], [address] Phone: [phone]   Fax:  [phone]  Physical Therapy Treatment/Discharge Summary  Patient Details  Name: [name] MRN: [id number] Date of Birth: [birth year] Referring Provider: Larey Seat, MD  Encounter Date: 06/19/2017      PT End of Session - 06/19/17 1301    Visit Number 8   Number of Visits 9   Date for PT Re-Evaluation 06/20/17   Authorization Type [employer] HMO (Secondary: Medicaid [place] A)   Authorization Time Period 05/23/17 to 06/20/17   Authorization - Visit Number 8   Authorization - Number of Visits 10   PT Start Time 1300   PT Stop Time 1330   PT Time Calculation (min) 30 min   Activity Tolerance Patient tolerated treatment well   Behavior During Therapy [facility] for tasks assessed/performed      Past Medical History:  Diagnosis Date  . Anxiety   . Depression   . Enlarged heart   . Fibroid 04/24/2016  . GERD (gastroesophageal reflux disease)   . Leaky heart valve   . Menorrhagia with irregular cycle 04/13/2016  . Multiple sclerosis ([place])    Dr Lonzo Cloud  . Multiple sclerosis exacerbation ([place])   . Numbness in right leg 02/13,12/12  . Obesity (BMI 35.0-39.9 without comorbidity)   . Other malaise and fatigue 10/20/2013  . Ovarian cyst 04/24/2016  . Regurgitation    cardiac valve. echo 2013 for first eval GILENYA,mod MR,TR    Past Surgical History:  Procedure Laterality Date  . BACK SURGERY  2006  . CESAREAN SECTION  2000  . CHOLECYSTECTOMY  2010  . COLONOSCOPY  09/14/2010   Fields-hyperplastic polyps, int hemorrhoids  . CYST EXCISION  2006   pilonoidal  . DILITATION & CURRETTAGE/HYSTROSCOPY WITH NOVASURE ABLATION N/A 07/21/2016   Procedure: DILATATION & CURETTAGE/HYSTEROSCOPY WITH NOVASURE ABLATION;  Surgeon: Jonnie Kind, MD;  Location: [facility];  Service: Gynecology;  Laterality: N/A;  Novasure lenght - 5.0 width - 4.1 power -  113 time - 2 min 03 seconds  . ESOPHAGOGASTRODUODENOSCOPY  08/05/2008   Fields-incomplete Schatzki's ring, distal esophageal erosions/esophagitis(mild), small HH, NEGATIVE h pylori  . LAPAROSCOPIC BILATERAL SALPINGECTOMY Bilateral 07/21/2016   Procedure: LAPAROSCOPIC RIGHT SALPINGECTOMY AND LEFT OOPHECTOMY;  Surgeon: Jonnie Kind, MD;  Location: [facility];  Service: Gynecology;  Laterality: Bilateral;  . LUMBAR DISC SURGERY    . US ECHOCARDIOGRAPHY  03/19/2012   RV mildly dilated,RA mod. dilated,strial septum aneurysmal,Mod. MR & TR    There were no vitals filed for this visit.      Subjective Assessment - 06/19/17 1301    Subjective pt states that she had a good weekend and she is not in any pain right now.   Currently in Pain? No/denies   Pain Onset More than a month ago            [facility] PT Assessment - 06/19/17 0001      AROM   Lumbar Flexion WNL, non-painful   Lumbar Extension WNL   Lumbar - Right Side Bend WNL   Lumbar - Left Side Bend WNL   Lumbar - Right Rotation WNL   Lumbar - Left Rotation WNL     Strength   Right Hip Extension 5/5  was 4+   Right Hip ABduction 5/5  was 4+   Left Hip Extension 5/5  was 4+   Left Hip ABduction 5/5  was 4+     Flexibility  Soft Tissue Assessment /Muscle Length yes   Quadriceps -Ely's BLE, no back pain     Ambulation/Gait   Ambulation Distance (Feet) 686 Feet  3MWT   Assistive device None     Standardized Balance Assessment   Standardized Balance Assessment Five Times Sit to Stand   Five times sit to stand comments  12.5 sec from chair, no UE, proper mechanics            [facility] Adult PT Treatment/Exercise - 06/19/17 0001      Lumbar Exercises: Supine   Other Supine Lumbar Exercises deadbugs with GTB x10 reps each     Lumbar Exercises: Quadruped   Opposite Arm/Leg Raise Right arm/Left leg;Left arm/Right leg;10 reps   Plank firehydrants with BTB x10 reps each               PT Education - 06/19/17 1338     Education provided Yes   Education Details discharge plans, continue HEP and updated HEP   Person(s) Educated Patient   Methods Explanation;Demonstration;Handout   Comprehension Verbalized understanding;Returned demonstration          PT Short Term Goals - 06/19/17 1303      PT SHORT TERM GOAL #1   Title Pt will be independent with HEP and consistently perform to maximize return to PLOF.   Time 2   Period Weeks   Status Achieved     PT SHORT TERM GOAL #2   Title Pt will demonstrate improved flexibility of her quads as demo by a negative Ely's test in order to decrease LBP and maximize overall function.   Time 2   Period Weeks   Status Achieved     PT SHORT TERM GOAL #3   Title Pt will have improved lumbar AROM to WNL to decrease pain and maximize overall function.   Time 2   Period Weeks   Status Achieved     PT SHORT TERM GOAL #4   Title Pt will have improved 3MWT by 158f and with minimal to no gait deviations and without exacerbation in LBP to demonstrate improved overall function and maximize community participation.   Time 2   Period Weeks   Status Partially Met           PT Long Term Goals - 06/19/17 1303      PT LONG TERM GOAL #1   Title Pt will report an improvement in standing tolerance by 50% or > (20 mins or >) to maximize her ability to perform house hold chores.   Baseline 8/28: 30 mins    Time 4   Period Weeks   Status Achieved     PT LONG TERM GOAL #2   Title Pt will have report an improvement in walking tolerance by 50% or > (20 mins or >) to maximize her community participation and function.   Baseline 8/28: 30 mins   Time 4   Period Weeks   Status Achieved     PT LONG TERM GOAL #3   Title Pt will have improved MMT to 5/5 of tested muscle groups in order to decrease pain and improve gait and overall function.   Time 4   Period Weeks   Status Achieved     PT LONG TERM GOAL #4   Title Pt will have improved 5xSTS to 15sec or < from  chair with no UE, no LBP, and with proper form to demonstrate improved overall functional strength.   Time 4   Period Weeks  Status Achieved               Plan - 2017/07/05 1338    Clinical Impression Statement PT reassessed pt's goals and outcome measures this date. Pt has made significant improvements since starting therapy. She has met all but 1 goal, partially meeting the last one. Pt has made significant improvements in her strength, AROM, and overall function since initially starting therapy. Pt verbalizes that she feels like she has made a lot of improvements with therapy. Due to progress made, pt will be discharged to a progressed HEP. She was educated that she can return with referral if she notices a significant decline in function and she verbalized understanding.   Rehab Potential Fair   PT Frequency 2x / week   PT Duration 4 weeks   PT Treatment/Interventions ADLs/Self Care Home Management;Cryotherapy;Electrical Stimulation;Moist Heat;Traction;Gait training;Stair training;Functional mobility training;Therapeutic activities;Therapeutic exercise;Balance training;Neuromuscular re-education;Patient/family education;Manual techniques;Passive range of motion;Dry needling;Energy conservation;Taping   PT Next Visit Plan Continue to check SI alignment and complete MET as needed.  Progress core and postural strengthening. Continue postural theraband exercises and add BLE functional strengthening in standing.  Progress to balance training (as tolerated with RLE); manual to lumbar paraspinals as needed   PT Home Exercise Plan eval: HS stretch and LTRs; 8/9: prone quad stretch; 8/16: bridges with band, clamshells with GTB, repeated L LF in standing; 8/23: birddogs, bil low rows, scap retraction, y's on wall with lift off, corner stretch, self SI mob; Jul 06, 2023: firehydrants with band, deadbugs with band, birddogs   Consulted and Agree with Plan of Care Patient      Patient will benefit from  skilled therapeutic intervention in order to improve the following deficits and impairments:  Abnormal gait, Decreased activity tolerance, Decreased balance, Decreased endurance, Decreased range of motion, Decreased strength, Difficulty walking, Hypomobility, Increased muscle spasms, Impaired flexibility, Postural dysfunction, Pain  Visit Diagnosis: Chronic right-sided low back pain without sciatica  Muscle weakness (generalized)  Difficulty in walking, not elsewhere classified  Unsteadiness on feet  Other symptoms and signs involving the musculoskeletal system  Other symptoms and signs involving the nervous system       G-Codes - 2017-07-05 1339    Functional Assessment Tool Used (Outpatient Only) FOTO, clinical judgement, MMT, SLS, 5xSTS, 3MWT   Functional Limitation Mobility: Walking and moving around   Mobility: Walking and Moving Around Goal Status [phone]) At least 1 percent but less than 20 percent impaired, limited or restricted   Mobility: Walking and Moving Around Discharge Status [phone]) At least 1 percent but less than 20 percent impaired, limited or restricted      Problem List Patient Active Problem List   Diagnosis Date Noted  . Sciatica of right side associated with disorder of lumbosacral spine 05/09/2017  . Status post lumbar spinal fusion 05/09/2017  . Encounter for sterilization education 06/21/2016  . Menorrhagia with regular cycle 06/21/2016  . Fibroid 04/24/2016  . Ovarian cyst 04/24/2016  . Menorrhagia with irregular cycle 04/13/2016  . Frontal sinusitis 09/30/2015  . Photosensitivity 03/30/2015  . Vertigo, aural 03/30/2015  . Morbid obesity ([place]) 10/19/2014  . Diplopia 10/19/2014  . MS (multiple sclerosis) ([place]) 06/17/2014  . Abnormality of gait 06/17/2014  . Other malaise and fatigue 10/20/2013  . Insomnia 08/06/2013  . Mitral insufficiency 05/17/2013  . Tricuspid insufficiency 05/17/2013  . Multiple sclerosis ([place]) 01/23/2013  . Severe obesity  (BMI >= 40) ([place]) 01/23/2013  . 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 Depression with anxiety 01/23/2013  .  Anemia 02/22/2012  . Hematochezia 02/22/2012  . Globus sensation 02/22/2012  . Dysphagia 02/22/2012  . GERD 09/01/2010  . CONSTIPATION, CHRONIC 09/01/2010      PHYSICAL THERAPY DISCHARGE SUMMARY  Visits from Start of Care: 8  Current functional level related to goals / functional outcomes: See clinical impression above   Remaining deficits: See clinical impression above   Education / Equipment: Updated HEP, can return with referral if decline in function Plan: Patient agrees to discharge.  Patient goals were met. Patient is being discharged due to meeting the stated rehab goals.  ?????       Geraldine Solar PT, [place] [address] [place], [place], [address] Phone: [phone]   Fax:  [phone]  Name: [name] MRN: [id number] Date of Birth: 1981-06-11

## 2017-06-19 NOTE — Patient Instructions (Signed)
  FIrehydrant with Theraband   Begin in quadruped with block underneath one knee. Lift opposite knee off ground while externally rotating/abducting hip.  Perform with your other exercises. 2-3 sets of 10-15 reps with the green band and then the blue once the green gets easy.   Supine Deadbug  Laying on your back with arms and legs extended toward the ceiling, slowly lower the left leg and right arm at the same time. Return to neutral and repeat for the right leg and left arm.  Perform with your other exercises. 2-3 sets of 10-15 reps with the green band and then the blue once the green gets easy   Birddog with Band  Place your foot in the theratube and hold the theratube in the opposite hand. Begin on all fours, then extend the foot and opposite hand while keeping the abdominals engaged.  Perform with your other exercises. 2-3 sets of 10-15 reps

## 2017-06-21 ENCOUNTER — Ambulatory Visit (HOSPITAL_COMMUNITY): Payer: Medicare HMO | Admitting: Physical Therapy

## 2017-06-22 ENCOUNTER — Other Ambulatory Visit: Payer: Self-pay | Admitting: Neurology

## 2017-06-26 ENCOUNTER — Telehealth: Payer: Self-pay | Admitting: Neurology

## 2017-06-26 NOTE — Telephone Encounter (Signed)
Fax was sent at 12:19 pm today for rx refill for xanax.

## 2017-06-26 NOTE — Telephone Encounter (Signed)
Pt said [employer] called her and advised they have not rec'd RX refill fax. Please refax. Please call pt when this is done

## 2017-06-26 NOTE — Telephone Encounter (Signed)
error 

## 2017-07-04 ENCOUNTER — Other Ambulatory Visit: Payer: Self-pay | Admitting: Neurology

## 2017-07-12 DIAGNOSIS — J069 Acute upper respiratory infection, unspecified: Secondary | ICD-10-CM | POA: Diagnosis not present

## 2017-07-12 DIAGNOSIS — Z299 Encounter for prophylactic measures, unspecified: Secondary | ICD-10-CM | POA: Diagnosis not present

## 2017-07-12 DIAGNOSIS — M419 Scoliosis, unspecified: Secondary | ICD-10-CM | POA: Diagnosis not present

## 2017-07-12 DIAGNOSIS — Z87891 Personal history of nicotine dependence: Secondary | ICD-10-CM | POA: Diagnosis not present

## 2017-07-12 DIAGNOSIS — T782XXA Anaphylactic shock, unspecified, initial encounter: Secondary | ICD-10-CM | POA: Diagnosis not present

## 2017-07-12 DIAGNOSIS — Z6841 Body Mass Index (BMI) 40.0 and over, adult: Secondary | ICD-10-CM | POA: Diagnosis not present

## 2017-08-07 ENCOUNTER — Ambulatory Visit (INDEPENDENT_AMBULATORY_CARE_PROVIDER_SITE_OTHER): Payer: Medicare HMO | Admitting: Adult Health

## 2017-08-07 ENCOUNTER — Encounter: Payer: Self-pay | Admitting: Adult Health

## 2017-08-07 VITALS — BP 136/89 | HR 74 | Ht 68.0 in | Wt 272.2 lb

## 2017-08-07 DIAGNOSIS — R269 Unspecified abnormalities of gait and mobility: Secondary | ICD-10-CM

## 2017-08-07 DIAGNOSIS — M545 Low back pain: Secondary | ICD-10-CM

## 2017-08-07 DIAGNOSIS — G35 Multiple sclerosis: Secondary | ICD-10-CM | POA: Diagnosis not present

## 2017-08-07 DIAGNOSIS — G8929 Other chronic pain: Secondary | ICD-10-CM

## 2017-08-07 NOTE — Progress Notes (Signed)
I agree with the assessment and plan as directed by NP .The patient is known to me .   Zohar Laing, MD  

## 2017-08-07 NOTE — Progress Notes (Signed)
PATIENT: [name] DOB: Apr 27, 1981  REASON FOR VISIT: follow up HISTORY FROM: patient  HISTORY OF PRESENT ILLNESS: Today 08/07/17  [name] is a 36 year old female with a history of multiple sclerosis. She returns today for follow-up. She states that she has remained stable. She denies any new numbness or weakness. She denies any significant changes with the bowels or bladder. In the past she has had to self-catheterize 3 times a day due to urinary rentention however now she is down to doing this only 4 times a month. She denies any changes with her gait or balance. Denies any significant changes with her mood or behavior. She remains on ampyra and plegridy. She does have an appointment this Friday to see Dr. Carloyn Manner regarding chronic back pain. She returns today for an evaluation.  HISTORY 08/07/2016: [name] is a 36 year old female with a history of multiple sclerosis. She returns today for follow-up. She reports that overall she has remained stable. She continues on Plegridy and tolerates it well. She denies any new numbness or weakness. Denies any changes with her gait or balance. Denies any changes with her vision. She does report changes with her bladder. Reports that she now has to catheterize herself due to urinary retention. She has followed with Dr. Zigmund Daniel and they feel that multiple sclerosis has affected her ability to contract her bladder. Patient's last MRI of the brain and spine was October 2016. At that time there was no change. She returns today for an evaluation.  REVIEW OF SYSTEMS: Out of a complete 14 system review of symptoms, the patient complains only of the following symptoms, and all other reviewed systems are negative.  See HPI  ALLERGIES: Allergies  Allergen Reactions  . Diclofenac Anaphylaxis  . Propoxyphene N-Acetaminophen Rash    HOME MEDICATIONS: Outpatient Medications Prior to Visit  Medication Sig Dispense Refill  . ALPRAZolam (XANAX) 1 MG  tablet TAKE 1/2 TABLET TWICE DAILY 60 tablet 1  . AMPYRA 10 MG TB12 TAKE 1 TABLET BY MOUTH TWICE A DAY 60 tablet 4  . PLEGRIDY 125 MCG/0.5ML SOPN INJECT 125MCG INTO THE SKIN EVERY 14 DAYS 6 pen 2  . ranitidine (ZANTAC) 150 MG tablet Take 150 mg by mouth 2 (two) times daily.    . traMADol (ULTRAM) 50 MG tablet Take 2 tablets (100 mg total) by mouth every 6 (six) hours as needed for severe pain. (Patient not taking: Reported on 08/07/2017) 90 tablet 0   No facility-administered medications prior to visit.     PAST MEDICAL HISTORY: Past Medical History:  Diagnosis Date  . Anxiety   . Depression   . Enlarged heart   . Fibroid 04/24/2016  . GERD (gastroesophageal reflux disease)   . Leaky heart valve   . Menorrhagia with irregular cycle 04/13/2016  . Multiple sclerosis ([place])    Dr Lonzo Cloud  . Multiple sclerosis exacerbation ([place])   . Numbness in right leg 02/13,12/12  . Obesity (BMI 35.0-39.9 without comorbidity)   . Other malaise and fatigue 10/20/2013  . Ovarian cyst 04/24/2016  . Regurgitation    cardiac valve. echo 2013 for first eval GILENYA,mod MR,TR    PAST SURGICAL HISTORY: Past Surgical History:  Procedure Laterality Date  . BACK SURGERY  2006  . CESAREAN SECTION  2000  . CHOLECYSTECTOMY  2010  . COLONOSCOPY  09/14/2010   Fields-hyperplastic polyps, int hemorrhoids  . CYST EXCISION  2006   pilonoidal  . DILITATION & CURRETTAGE/HYSTROSCOPY WITH NOVASURE ABLATION N/A 07/21/2016  Procedure: DILATATION & CURETTAGE/HYSTEROSCOPY WITH NOVASURE ABLATION;  Surgeon: Jonnie Kind, MD;  Location: [facility];  Service: Gynecology;  Laterality: N/A;  Novasure lenght - 5.0 width - 4.1 power - 113 time - 2 min 03 seconds  . ESOPHAGOGASTRODUODENOSCOPY  08/05/2008   Fields-incomplete Schatzki's ring, distal esophageal erosions/esophagitis(mild), small HH, NEGATIVE h pylori  . LAPAROSCOPIC BILATERAL SALPINGECTOMY Bilateral 07/21/2016   Procedure: LAPAROSCOPIC RIGHT SALPINGECTOMY AND  LEFT OOPHECTOMY;  Surgeon: Jonnie Kind, MD;  Location: [facility];  Service: Gynecology;  Laterality: Bilateral;  . LUMBAR DISC SURGERY    . US ECHOCARDIOGRAPHY  03/19/2012   RV mildly dilated,RA mod. dilated,strial septum aneurysmal,Mod. MR & TR    FAMILY HISTORY: Family History  Problem Relation Age of Onset  . Other Mother        lost leg, has trouble swallowing  . Thyroid nodules Mother   . Heart attack Father   . Coronary artery disease Father   . Diabetes Father   . Rheum arthritis Father   . Colon cancer Paternal Uncle 12  . Colon cancer Paternal Grandmother 91  . Diabetes Paternal Grandmother   . Cancer Paternal Grandmother        breast,lung  . Cancer Maternal Grandmother 67       kind unknown  . Diabetes Maternal Grandmother   . Myasthenia gravis Maternal Grandfather   . Other Sister        tumor on ovary  . ADD / ADHD Son   . Heart attack Paternal Grandfather   . Multiple sclerosis Other        paternal great aunt    SOCIAL HISTORY: Social History   Social History  . Marital status: Married    Spouse name: Divorced   . Number of children: 1  . Years of education: college   Occupational History  . disabled    Social History Main Topics  . Smoking status: Former Smoker    Packs/day: 1.00    Years: 20.00    Types: Cigarettes    Quit date: 02/15/2012  . Smokeless tobacco: Never Used  . Alcohol use No  . Drug use: No  . Sexual activity: No   Other Topics Concern  . Not on file   Social History Narrative   Patient is divorced and lives at home and he son lives with her.   Patient has one child.   Patient is disabled.   Patient has some college education.   Patient is right-handed.   Patient drinks 3 servings of caffeine daily            PHYSICAL EXAM  Vitals:   08/07/17 1319  BP: 136/89  Pulse: 74  Weight: 272 lb 3.2 oz (123.5 kg)  Height: 5\' 8"  (1.727 m)   Body mass index is 41.39 kg/m.  Generalized: Well developed, in no acute  distress   Neurological examination  Mentation: Alert oriented to time, place, history taking. Follows all commands speech and language fluent Cranial nerve II-XII: Pupils were equal round reactive to light. Extraocular movements were full, visual field were full on confrontational test. Facial sensation and strength were normal. Uvula tongue midline. Head turning and shoulder shrug  were normal and symmetric. Motor: The motor testing reveals 5 over 5 strength of all 4 extremities. Good symmetric motor tone is noted throughout.  Sensory: Sensory testing is intact to soft touch on all 4 extremities. No evidence of extinction is noted.  Coordination: Cerebellar testing reveals good finger-nose-finger and  heel-to-shin bilaterally.  Gait and station: Slight limp on the left when ambulating. Reflexes: Deep tendon reflexes are symmetric and normal bilaterally.   DIAGNOSTIC DATA (LABS, IMAGING, TESTING) - I reviewed patient records, labs, notes, testing and imaging myself where available.  Lab Results  Component Value Date   WBC 6.7 06/03/2017   HGB 13.7 06/03/2017   HCT 40.3 06/03/2017   MCV 77.6 (L) 06/03/2017   PLT 256 06/03/2017      Component Value Date/Time   NA 140 06/03/2017 1923   NA 142 04/05/2016 1634   K 3.2 (L) 06/03/2017 1923   K 4.2 02/28/2012 1113   CL 108 06/03/2017 1923   CL 107 02/28/2012 1113   CO2 25 06/03/2017 1923   CO2 23 02/28/2012 1113   GLUCOSE 113 (H) 06/03/2017 1923   BUN 13 06/03/2017 1923   BUN 7 04/05/2016 1634   CREATININE 0.74 06/03/2017 1923   CREATININE 0.73 02/28/2012 1113   CALCIUM 9.2 06/03/2017 1923   CALCIUM 9.5 02/28/2012 1113   PROT 6.6 07/17/2016 0930   PROT 6.8 04/05/2016 1634   PROT 6.4 02/28/2012 1113   ALBUMIN 3.5 07/17/2016 0930   ALBUMIN 4.1 04/05/2016 1634   AST 15 07/17/2016 0930   AST 14 02/28/2012 1113   ALT 17 07/17/2016 0930   ALKPHOS 46 07/17/2016 0930   ALKPHOS 49 02/28/2012 1113   BILITOT 0.2 (L) 07/17/2016 0930    BILITOT 0.2 04/05/2016 1634   GFRNONAA >60 06/03/2017 1923   GFRAA >60 06/03/2017 1923   Lab Results  Component Value Date   CHOL 110 12/09/2014   HDL 47 12/09/2014   LDLCALC 50 12/09/2014   TRIG 64 12/09/2014   CHOLHDL 2.3 12/09/2014    Lab Results  Component Value Date   TSH 3.700 12/09/2014      ASSESSMENT AND PLAN 36 y.o. year old female  has a past medical history of Anxiety; Depression; Enlarged heart; Fibroid (04/24/2016); GERD (gastroesophageal reflux disease); Leaky heart valve; Menorrhagia with irregular cycle (04/13/2016); Multiple sclerosis ([place]); Multiple sclerosis exacerbation ([place]); Numbness in right leg (02/13,12/12); Obesity (BMI 35.0-39.9 without comorbidity); Other malaise and fatigue (10/20/2013); Ovarian cyst (04/24/2016); and Regurgitation. here with:  1. Multiple sclerosis 2. Chronic back pain 3. Abnormality of gait  Overall the patient has remained stable. She will continue on plegridy and ampyra. She recently had blood work that was relatively unremarkable. She has an appointment with Dr. Carloyn Manner for chronic back pain. She is advised that if her symptoms worsen or she develops new symptoms she should let us know. Will follow up in 6 months or sooner if needed.   Ward Givens, MSN, NP-C 08/07/2017, 1:40 PM [facility] [address], [place], [place] [address] [phone]

## 2017-08-07 NOTE — Patient Instructions (Signed)
Your Plan:  Continue Plegridy and ampyra  If your symptoms worsen or you develop new symptoms please let us know.   Thank you for coming to see [place] at [facility]. I hope we have been able to provide you high quality care today.  You may receive a patient satisfaction survey over the next few weeks. We would appreciate your feedback and comments so that we may continue to improve ourselves and the health of our patients.

## 2017-08-21 DIAGNOSIS — Z713 Dietary counseling and surveillance: Secondary | ICD-10-CM | POA: Diagnosis not present

## 2017-08-21 DIAGNOSIS — Z299 Encounter for prophylactic measures, unspecified: Secondary | ICD-10-CM | POA: Diagnosis not present

## 2017-08-21 DIAGNOSIS — R030 Elevated blood-pressure reading, without diagnosis of hypertension: Secondary | ICD-10-CM | POA: Diagnosis not present

## 2017-08-21 DIAGNOSIS — M419 Scoliosis, unspecified: Secondary | ICD-10-CM | POA: Diagnosis not present

## 2017-08-21 DIAGNOSIS — Z6841 Body Mass Index (BMI) 40.0 and over, adult: Secondary | ICD-10-CM | POA: Diagnosis not present

## 2017-09-25 ENCOUNTER — Other Ambulatory Visit: Payer: Self-pay | Admitting: Nurse Practitioner

## 2017-09-25 DIAGNOSIS — M47816 Spondylosis without myelopathy or radiculopathy, lumbar region: Secondary | ICD-10-CM

## 2017-10-04 ENCOUNTER — Ambulatory Visit
Admission: RE | Admit: 2017-10-04 | Discharge: 2017-10-04 | Disposition: A | Discharge status: Home / Self Care | Payer: Medicare HMO | Source: Ambulatory Visit | Attending: Nurse Practitioner | Admitting: Nurse Practitioner

## 2017-10-04 DIAGNOSIS — M47816 Spondylosis without myelopathy or radiculopathy, lumbar region: Secondary | ICD-10-CM

## 2017-10-04 MED ORDER — METHYLPREDNISOLONE ACETATE 40 MG/ML INJ SUSP (RADIOLOG
120.0000 mg | Freq: Once | INTRAMUSCULAR | Status: AC
  Administered 2017-10-04: 120 mg via EPIDURAL

## 2017-10-04 MED ORDER — IOPAMIDOL (ISOVUE-M 200) INJECTION 41%
1.0000 mL | Freq: Once | INTRAMUSCULAR | Status: AC
  Administered 2017-10-04: 1 mL via EPIDURAL

## 2017-10-04 NOTE — Discharge Instructions (Signed)

## 2017-10-27 ENCOUNTER — Other Ambulatory Visit: Payer: Self-pay | Admitting: Neurology

## 2017-12-21 ENCOUNTER — Telehealth: Payer: Self-pay | Admitting: Adult Health

## 2017-12-21 DIAGNOSIS — G35 Multiple sclerosis: Secondary | ICD-10-CM

## 2017-12-21 DIAGNOSIS — L568 Other specified acute skin changes due to ultraviolet radiation: Secondary | ICD-10-CM

## 2017-12-21 DIAGNOSIS — H81312 Aural vertigo, left ear: Secondary | ICD-10-CM

## 2017-12-21 MED ORDER — PEGINTERFERON BETA-1A 125 MCG/0.5ML ~~LOC~~ SOPN: PEN_INJECTOR | SUBCUTANEOUS | 2 refills | Status: DC

## 2017-12-21 NOTE — Telephone Encounter (Signed)
Refilled. Has appt with MM/NP in 01/2018.

## 2017-12-21 NOTE — Telephone Encounter (Signed)
Pt called stating her pharmacy requesting a refill for PLEGRIDY 125 MCG/0.5ML SOPN sent to [facility]

## 2018-02-05 ENCOUNTER — Ambulatory Visit (INDEPENDENT_AMBULATORY_CARE_PROVIDER_SITE_OTHER): Payer: Medicare HMO | Admitting: Adult Health

## 2018-02-05 ENCOUNTER — Encounter: Payer: Self-pay | Admitting: Adult Health

## 2018-02-05 VITALS — BP 124/84 | HR 83 | Ht 68.0 in | Wt 254.0 lb

## 2018-02-05 DIAGNOSIS — G35 Multiple sclerosis: Secondary | ICD-10-CM

## 2018-02-05 MED ORDER — ALPRAZOLAM 1 MG PO TABS: 0.5000 mg | ORAL_TABLET | Freq: Two times a day (BID) | ORAL | 5 refills | Status: DC

## 2018-02-05 NOTE — Patient Instructions (Signed)
Your Plan:  Continue plegridy Blood work today Stop ampyra If your symptoms worsen or you develop new symptoms please let us know.   Thank you for coming to see [place] at [facility]. I hope we have been able to provide you high quality care today.  You may receive a patient satisfaction survey over the next few weeks. We would appreciate your feedback and comments so that we may continue to improve ourselves and the health of our patients.

## 2018-02-05 NOTE — Progress Notes (Signed)
PATIENT: [name] DOB: July 17, 1981  REASON FOR VISIT: follow up HISTORY FROM: patient  HISTORY OF PRESENT ILLNESS: Today 02/05/18  [name] is a 37 year old female with a history of multiple sclerosis.  She returns today for follow-up.  She is currently on ampyra and plegridy.  She continues to tolerate these medications well.  She does not feel that the Ampyra patient has offered her much benefit.  She denies any new numbness or tingling. Has always had numbness in the hands.  Denies any weakness in extremities.  No change in bowel or bladder.  She continues to have to self catheterize on occasion.  She states that this does not occur daily.  She does see Dr. Zigmund Daniel with urology.  No change in her gait or balance.  Denies any changes with her vision.  No significant changes with her mood or behavior.  She returns today for evaluation.  HISTORY 08/07/17  [name] is a 37 year old female with a history of multiple sclerosis. She returns today for follow-up. She states that she has remained stable. She denies any new numbness or weakness. She denies any significant changes with the bowels or bladder. In the past she has had to self-catheterize 3 times a day due to urinary rentention however now she is down to doing this only 4 times a month. She denies any changes with her gait or balance. Denies any significant changes with her mood or behavior. She remains on ampyra and plegridy. She does have an appointment this Friday to see Dr. Carloyn Manner regarding chronic back pain. She returns today for an evaluation.  HISTORY 08/07/2016: [name] is a 37 year old female with a history of multiple sclerosis. She returns today for follow-up. She reports that overall she has remained stable. She continues on Plegridy and tolerates it well. She denies any new numbness or weakness. Denies any changes with her gait or balance. Denies any changes with her vision. She does report changes with her bladder.  Reports that she now has to catheterize herself due to urinary retention. She has followed with Dr. Zigmund Daniel and they feel that multiple sclerosis has affected her ability to contract her bladder. Patient's last MRI of the brain and spine was October 2016. At that time there was no change. She returns today for an evaluation.    REVIEW OF SYSTEMS: Out of a complete 14 system review of symptoms, the patient complains only of the following symptoms, and all other reviewed systems are negative.  See HPI  ALLERGIES: Allergies  Allergen Reactions  . Diclofenac Anaphylaxis  . Propoxyphene N-Acetaminophen Rash    HOME MEDICATIONS: Outpatient Medications Prior to Visit  Medication Sig Dispense Refill  . ALPRAZolam (XANAX) 1 MG tablet TAKE 1/2 TABLET TWICE DAILY 60 tablet 1  . cyclobenzaprine (FLEXERIL) 10 MG tablet Take 10 mg by mouth 3 (three) times daily as needed for muscle spasms.    Marland Kitchen dalfampridine 10 MG TB12 TAKE 1 TABLET BY MOUTH TWICE A DAY 60 tablet 10  . Peginterferon Beta-1a (PLEGRIDY) 125 MCG/0.5ML SOPN INJECT 125MCG INTO THE SKIN EVERY 14 DAYS 6 pen 2  . ranitidine (ZANTAC) 150 MG tablet Take 150 mg by mouth 2 (two) times daily.     No facility-administered medications prior to visit.     PAST MEDICAL HISTORY: Past Medical History:  Diagnosis Date  . Anxiety   . Depression   . Enlarged heart   . Fibroid 04/24/2016  . GERD (gastroesophageal reflux disease)   .  Leaky heart valve   . Menorrhagia with irregular cycle 04/13/2016  . Multiple sclerosis ([place])    Dr Lonzo Cloud  . Multiple sclerosis exacerbation ([place])   . Numbness in right leg 02/13,12/12  . Obesity (BMI 35.0-39.9 without comorbidity)   . Other malaise and fatigue 10/20/2013  . Ovarian cyst 04/24/2016  . Regurgitation    cardiac valve. echo 2013 for first eval GILENYA,mod MR,TR    PAST SURGICAL HISTORY: Past Surgical History:  Procedure Laterality Date  . BACK SURGERY  2006  . CESAREAN SECTION  2000  .  CHOLECYSTECTOMY  2010  . COLONOSCOPY  09/14/2010   Fields-hyperplastic polyps, int hemorrhoids  . CYST EXCISION  2006   pilonoidal  . DILITATION & CURRETTAGE/HYSTROSCOPY WITH NOVASURE ABLATION N/A 07/21/2016   Procedure: DILATATION & CURETTAGE/HYSTEROSCOPY WITH NOVASURE ABLATION;  Surgeon: Jonnie Kind, MD;  Location: [facility];  Service: Gynecology;  Laterality: N/A;  Novasure lenght - 5.0 width - 4.1 power - 113 time - 2 min 03 seconds  . ESOPHAGOGASTRODUODENOSCOPY  08/05/2008   Fields-incomplete Schatzki's ring, distal esophageal erosions/esophagitis(mild), small HH, NEGATIVE h pylori  . LAPAROSCOPIC BILATERAL SALPINGECTOMY Bilateral 07/21/2016   Procedure: LAPAROSCOPIC RIGHT SALPINGECTOMY AND LEFT OOPHECTOMY;  Surgeon: Jonnie Kind, MD;  Location: [facility];  Service: Gynecology;  Laterality: Bilateral;  . LUMBAR DISC SURGERY    . US ECHOCARDIOGRAPHY  03/19/2012   RV mildly dilated,RA mod. dilated,strial septum aneurysmal,Mod. MR & TR    FAMILY HISTORY: Family History  Problem Relation Age of Onset  . Other Mother        lost leg, has trouble swallowing  . Thyroid nodules Mother   . Heart attack Father   . Coronary artery disease Father   . Diabetes Father   . Rheum arthritis Father   . Colon cancer Paternal Uncle 29  . Colon cancer Paternal Grandmother 24  . Diabetes Paternal Grandmother   . Cancer Paternal Grandmother        breast,lung  . Cancer Maternal Grandmother 65       kind unknown  . Diabetes Maternal Grandmother   . Myasthenia gravis Maternal Grandfather   . Other Sister        tumor on ovary  . ADD / ADHD Son   . Heart attack Paternal Grandfather   . Multiple sclerosis Other        paternal great aunt    SOCIAL HISTORY: Social History   Socioeconomic History  . Marital status: Married    Spouse name: Divorced   . Number of children: 1  . Years of education: college  . Highest education level: Not on file  Occupational History  . Occupation:  disabled  Social Needs  . Financial resource strain: Not on file  . Food insecurity:    Worry: Not on file    Inability: Not on file  . Transportation needs:    Medical: Not on file    Non-medical: Not on file  Tobacco Use  . Smoking status: Former Smoker    Packs/day: 1.00    Years: 20.00    Pack years: 20.00    Types: Cigarettes    Last attempt to quit: 02/15/2012    Years since quitting: 5.9  . Smokeless tobacco: Never Used  Substance and Sexual Activity  . Alcohol use: No    Alcohol/week: 0.0 oz  . Drug use: No  . Sexual activity: Never    Birth control/protection: None  Lifestyle  . Physical activity:  Days per week: Not on file    Minutes per session: Not on file  . Stress: Not on file  Relationships  . Social connections:    Talks on phone: Not on file    Gets together: Not on file    Attends religious service: Not on file    Active member of club or organization: Not on file    Attends meetings of clubs or organizations: Not on file    Relationship status: Not on file  . Intimate partner violence:    Fear of current or ex partner: Not on file    Emotionally abused: Not on file    Physically abused: Not on file    Forced sexual activity: Not on file  Other Topics Concern  . Not on file  Social History Narrative   Patient is divorced and lives at home and he son lives with her.   Patient has one child.   Patient is disabled.   Patient has some college education.   Patient is right-handed.   Patient drinks 3 servings of caffeine daily         PHYSICAL EXAM  Vitals:   02/05/18 1423  BP: 124/84  Pulse: 83  Weight: 254 lb (115.2 kg)  Height: 5\' 8"  (1.727 m)   Body mass index is 38.62 kg/m.  Generalized: Well developed, in no acute distress   Neurological examination  Mentation: Alert oriented to time, place, history taking. Follows all commands speech and language fluent Cranial nerve II-XII: Pupils were equal round reactive to light.  Extraocular movements were full, visual field were full on confrontational test. Facial sensation and strength were normal. Uvula tongue midline. Head turning and shoulder shrug  were normal and symmetric. Motor: The motor testing reveals 5 over 5 strength of all 4 extremities. Good symmetric motor tone is noted throughout.  Sensory: Sensory testing is intact to soft touch on all 4 extremities. No evidence of extinction is noted.  Coordination: Cerebellar testing reveals good finger-nose-finger and heel-to-shin bilaterally.  Gait and station: Patient has a slight limp on the right. Romberg is negative. No drift is seen.  Reflexes: Deep tendon reflexes are symmetric and normal bilaterally.   DIAGNOSTIC DATA (LABS, IMAGING, TESTING) - I reviewed patient records, labs, notes, testing and imaging myself where available.  Lab Results  Component Value Date   WBC 6.7 06/03/2017   HGB 13.7 06/03/2017   HCT 40.3 06/03/2017   MCV 77.6 (L) 06/03/2017   PLT 256 06/03/2017      Component Value Date/Time   NA 140 06/03/2017 1923   NA 142 04/05/2016 1634   K 3.2 (L) 06/03/2017 1923   K 4.2 02/28/2012 1113   CL 108 06/03/2017 1923   CL 107 02/28/2012 1113   CO2 25 06/03/2017 1923   CO2 23 02/28/2012 1113   GLUCOSE 113 (H) 06/03/2017 1923   BUN 13 06/03/2017 1923   BUN 7 04/05/2016 1634   CREATININE 0.74 06/03/2017 1923   CREATININE 0.73 02/28/2012 1113   CALCIUM 9.2 06/03/2017 1923   CALCIUM 9.5 02/28/2012 1113   PROT 6.6 07/17/2016 0930   PROT 6.8 04/05/2016 1634   PROT 6.4 02/28/2012 1113   ALBUMIN 3.5 07/17/2016 0930   ALBUMIN 4.1 04/05/2016 1634   AST 15 07/17/2016 0930   AST 14 02/28/2012 1113   ALT 17 07/17/2016 0930   ALKPHOS 46 07/17/2016 0930   ALKPHOS 49 02/28/2012 1113   BILITOT 0.2 (L) 07/17/2016 0930   BILITOT 0.2  04/05/2016 1634   GFRNONAA >60 06/03/2017 1923   GFRAA >60 06/03/2017 1923   Lab Results  Component Value Date   CHOL 110 12/09/2014   HDL 47 12/09/2014    LDLCALC 50 12/09/2014   TRIG 64 12/09/2014   CHOLHDL 2.3 12/09/2014   No results found for: HGBA1C No results found for: VITAMINB12 Lab Results  Component Value Date   TSH 3.700 12/09/2014      ASSESSMENT AND PLAN 37 y.o. year old female  has a past medical history of Anxiety, Depression, Enlarged heart, Fibroid (04/24/2016), GERD (gastroesophageal reflux disease), Leaky heart valve, Menorrhagia with irregular cycle (04/13/2016), Multiple sclerosis ([place]), Multiple sclerosis exacerbation ([place]), Numbness in right leg (02/13,12/12), Obesity (BMI 35.0-39.9 without comorbidity), Other malaise and fatigue (10/20/2013), Ovarian cyst (04/24/2016), and Regurgitation. here with:  1.  Multiple sclerosis  Overall the patient is doing well.  She will continue on plegridy.  She will discontinue ampyra.  However if her symptoms worsen she will let us know.  I will check blood work today.  We will continue to monitor symptoms.  Patient is advised that if her symptoms worsen or she develops new symptoms she should let us know.  She will follow-up in 6 months or sooner if needed.    I spent 15 minutes with the patient. 50% of this time was spent discussing her treatment and symptoms.   Ward Givens, MSN, NP-C 02/05/2018, 2:08 PM [facility] [address] [place], [place] [address] [phone]

## 2018-02-06 LAB — COMPREHENSIVE METABOLIC PANEL
ALT: 13 IU/L (ref 0–32)
AST: 15 IU/L (ref 0–40)
Albumin/Globulin Ratio: 1.5 (ref 1.2–2.2)
Albumin: 4.1 g/dL (ref 3.5–5.5)
Alkaline Phosphatase: 50 IU/L (ref 39–117)
BUN / CREAT RATIO: 15 (ref 9–23)
BUN: 9 mg/dL (ref 6–20)
Bilirubin Total: 0.4 mg/dL (ref 0.0–1.2)
CALCIUM: 9.6 mg/dL (ref 8.7–10.2)
CO2: 26 mmol/L (ref 20–29)
CREATININE: 0.59 mg/dL (ref 0.57–1.00)
Chloride: 98 mmol/L (ref 96–106)
GFR calc Af Amer: 134 mL/min/{1.73_m2} (ref >59)
GFR, EST NON AFRICAN AMERICAN: 117 mL/min/{1.73_m2} (ref >59)
GLOBULIN, TOTAL: 2.8 g/dL (ref 1.5–4.5)
Glucose: 74 mg/dL (ref 65–99)
Potassium: 4.2 mmol/L (ref 3.5–5.2)
SODIUM: 138 mmol/L (ref 134–144)
Total Protein: 6.9 g/dL (ref 6.0–8.5)

## 2018-02-06 LAB — CBC WITH DIFFERENTIAL/PLATELET
BASOS: 1 %
Basophils Absolute: 0.1 10*3/uL (ref 0.0–0.2)
EOS (ABSOLUTE): 0.3 10*3/uL (ref 0.0–0.4)
EOS: 3 %
HEMATOCRIT: 40.6 % (ref 34.0–46.6)
Hemoglobin: 13.7 g/dL (ref 11.1–15.9)
Immature Grans (Abs): 0 10*3/uL (ref 0.0–0.1)
Immature Granulocytes: 0 %
LYMPHS ABS: 1.8 10*3/uL (ref 0.7–3.1)
Lymphs: 19 %
MCH: 26.7 pg (ref 26.6–33.0)
MCHC: 33.7 g/dL (ref 31.5–35.7)
MCV: 79 fL (ref 79–97)
MONOS ABS: 0.7 10*3/uL (ref 0.1–0.9)
Monocytes: 7 %
NEUTROS ABS: 6.6 10*3/uL (ref 1.4–7.0)
Neutrophils: 70 %
Platelets: 331 10*3/uL (ref 150–379)
RBC: 5.13 x10E6/uL (ref 3.77–5.28)
RDW: 15.4 % (ref 12.3–15.4)
WBC: 9.4 10*3/uL (ref 3.4–10.8)

## 2018-02-07 ENCOUNTER — Telehealth: Payer: Self-pay | Admitting: Neurology

## 2018-02-07 NOTE — Telephone Encounter (Signed)
-----   Message from Ward Givens, NP sent at 02/07/2018 10:04 AM EDT ----- Blood work unremarkable. Please call patient.

## 2018-02-07 NOTE — Telephone Encounter (Signed)
Called the pt. No answer. LVM informing the patient of the normal lab work and instructed that if she had concerns to call us.  If patient calls back just make her aware that the lab work was normal and Ward Givens, NP had no concerns.

## 2018-03-05 ENCOUNTER — Telehealth: Payer: Self-pay | Admitting: *Deleted

## 2018-03-05 DIAGNOSIS — Z0289 Encounter for other administrative examinations: Secondary | ICD-10-CM

## 2018-03-05 NOTE — Telephone Encounter (Signed)
Called patient to inquire what the paper she brought in is needed for. She stated that she has been attending [employer] college for 4 years. She has been on disability since 2011. [employer] is taking $150 out of her disability check each month. She has been told that if Dr Brett Fairy fills out the paper re: she is fully disabled, she will get "forgiveness" for the fees she has paid.  She stated Dr Brett Fairy was aware of the letter.  Form will be given to Dr Brett Fairy to complete.

## 2018-03-06 ENCOUNTER — Telehealth: Payer: Self-pay | Admitting: *Deleted

## 2018-03-06 NOTE — Progress Notes (Signed)
I agree with the assessment and plan as directed by NP .The patient is known to me .   Maly Lemarr, MD  

## 2018-03-06 NOTE — Telephone Encounter (Signed)
lvm unable to reach pt. Pt form is ready need a fax number to fax the form.

## 2018-03-24 ENCOUNTER — Inpatient Hospital Stay (HOSPITAL_COMMUNITY)
Admission: EM | Admit: 2018-03-24 | Discharge: 2018-03-27 | DRG: 343 | Disposition: A | Discharge status: Home / Self Care | Payer: Medicare Other | Attending: General Surgery | Admitting: General Surgery

## 2018-03-24 ENCOUNTER — Encounter (HOSPITAL_COMMUNITY): Payer: Self-pay | Admitting: *Deleted

## 2018-03-24 DIAGNOSIS — Z800 Family history of malignant neoplasm of digestive organs: Secondary | ICD-10-CM

## 2018-03-24 DIAGNOSIS — Z833 Family history of diabetes mellitus: Secondary | ICD-10-CM | POA: Diagnosis not present

## 2018-03-24 DIAGNOSIS — K3580 Unspecified acute appendicitis: Secondary | ICD-10-CM | POA: Diagnosis not present

## 2018-03-24 DIAGNOSIS — Z820 Family history of epilepsy and other diseases of the nervous system: Secondary | ICD-10-CM

## 2018-03-24 DIAGNOSIS — Z886 Allergy status to analgesic agent status: Secondary | ICD-10-CM | POA: Diagnosis not present

## 2018-03-24 DIAGNOSIS — R9431 Abnormal electrocardiogram [ECG] [EKG]: Secondary | ICD-10-CM | POA: Diagnosis not present

## 2018-03-24 DIAGNOSIS — F329 Major depressive disorder, single episode, unspecified: Secondary | ICD-10-CM | POA: Diagnosis not present

## 2018-03-24 DIAGNOSIS — K219 Gastro-esophageal reflux disease without esophagitis: Secondary | ICD-10-CM | POA: Diagnosis present

## 2018-03-24 DIAGNOSIS — Z8249 Family history of ischemic heart disease and other diseases of the circulatory system: Secondary | ICD-10-CM

## 2018-03-24 DIAGNOSIS — I081 Rheumatic disorders of both mitral and tricuspid valves: Secondary | ICD-10-CM | POA: Diagnosis present

## 2018-03-24 DIAGNOSIS — F419 Anxiety disorder, unspecified: Secondary | ICD-10-CM | POA: Diagnosis not present

## 2018-03-24 DIAGNOSIS — K529 Noninfective gastroenteritis and colitis, unspecified: Secondary | ICD-10-CM | POA: Diagnosis not present

## 2018-03-24 DIAGNOSIS — G35 Multiple sclerosis: Secondary | ICD-10-CM | POA: Diagnosis not present

## 2018-03-24 DIAGNOSIS — E876 Hypokalemia: Secondary | ICD-10-CM

## 2018-03-24 DIAGNOSIS — Z8261 Family history of arthritis: Secondary | ICD-10-CM | POA: Diagnosis not present

## 2018-03-24 DIAGNOSIS — Z888 Allergy status to other drugs, medicaments and biological substances status: Secondary | ICD-10-CM

## 2018-03-24 DIAGNOSIS — Z9049 Acquired absence of other specified parts of digestive tract: Secondary | ICD-10-CM

## 2018-03-24 DIAGNOSIS — R1031 Right lower quadrant pain: Secondary | ICD-10-CM | POA: Diagnosis present

## 2018-03-24 DIAGNOSIS — Z87891 Personal history of nicotine dependence: Secondary | ICD-10-CM

## 2018-03-24 DIAGNOSIS — R109 Unspecified abdominal pain: Secondary | ICD-10-CM | POA: Diagnosis not present

## 2018-03-24 NOTE — ED Triage Notes (Signed)
Pt with abd pain that radiates to back.  Pt denies N/V/D. Severe abd cramping since eating a salad today around 1400.

## 2018-03-25 ENCOUNTER — Other Ambulatory Visit: Payer: Self-pay

## 2018-03-25 ENCOUNTER — Emergency Department (HOSPITAL_COMMUNITY): Payer: Medicare Other

## 2018-03-25 DIAGNOSIS — R9431 Abnormal electrocardiogram [ECG] [EKG]: Secondary | ICD-10-CM | POA: Diagnosis not present

## 2018-03-25 DIAGNOSIS — Z886 Allergy status to analgesic agent status: Secondary | ICD-10-CM | POA: Diagnosis not present

## 2018-03-25 DIAGNOSIS — E876 Hypokalemia: Secondary | ICD-10-CM

## 2018-03-25 DIAGNOSIS — K219 Gastro-esophageal reflux disease without esophagitis: Secondary | ICD-10-CM | POA: Diagnosis not present

## 2018-03-25 DIAGNOSIS — K3580 Unspecified acute appendicitis: Secondary | ICD-10-CM | POA: Diagnosis not present

## 2018-03-25 DIAGNOSIS — Z888 Allergy status to other drugs, medicaments and biological substances status: Secondary | ICD-10-CM | POA: Diagnosis not present

## 2018-03-25 DIAGNOSIS — Z800 Family history of malignant neoplasm of digestive organs: Secondary | ICD-10-CM | POA: Diagnosis not present

## 2018-03-25 DIAGNOSIS — Z820 Family history of epilepsy and other diseases of the nervous system: Secondary | ICD-10-CM | POA: Diagnosis not present

## 2018-03-25 DIAGNOSIS — Z8261 Family history of arthritis: Secondary | ICD-10-CM | POA: Diagnosis not present

## 2018-03-25 DIAGNOSIS — F329 Major depressive disorder, single episode, unspecified: Secondary | ICD-10-CM | POA: Diagnosis not present

## 2018-03-25 DIAGNOSIS — G35 Multiple sclerosis: Secondary | ICD-10-CM

## 2018-03-25 DIAGNOSIS — F419 Anxiety disorder, unspecified: Secondary | ICD-10-CM | POA: Diagnosis not present

## 2018-03-25 DIAGNOSIS — R109 Unspecified abdominal pain: Secondary | ICD-10-CM | POA: Diagnosis not present

## 2018-03-25 DIAGNOSIS — K529 Noninfective gastroenteritis and colitis, unspecified: Secondary | ICD-10-CM | POA: Diagnosis not present

## 2018-03-25 DIAGNOSIS — R1031 Right lower quadrant pain: Secondary | ICD-10-CM | POA: Diagnosis present

## 2018-03-25 DIAGNOSIS — Z9049 Acquired absence of other specified parts of digestive tract: Secondary | ICD-10-CM | POA: Diagnosis not present

## 2018-03-25 DIAGNOSIS — Z8249 Family history of ischemic heart disease and other diseases of the circulatory system: Secondary | ICD-10-CM | POA: Diagnosis not present

## 2018-03-25 DIAGNOSIS — Z833 Family history of diabetes mellitus: Secondary | ICD-10-CM | POA: Diagnosis not present

## 2018-03-25 DIAGNOSIS — I081 Rheumatic disorders of both mitral and tricuspid valves: Secondary | ICD-10-CM | POA: Diagnosis not present

## 2018-03-25 DIAGNOSIS — Z87891 Personal history of nicotine dependence: Secondary | ICD-10-CM | POA: Diagnosis not present

## 2018-03-25 LAB — CBC WITH DIFFERENTIAL/PLATELET
Basophils Absolute: 0 10*3/uL (ref 0.0–0.1)
Basophils Relative: 0 %
EOS ABS: 0.2 10*3/uL (ref 0.0–0.7)
EOS PCT: 1 %
HCT: 40.3 % (ref 36.0–46.0)
Hemoglobin: 14 g/dL (ref 12.0–15.0)
LYMPHS ABS: 1.5 10*3/uL (ref 0.7–4.0)
LYMPHS PCT: 9 %
MCH: 27.5 pg (ref 26.0–34.0)
MCHC: 34.7 g/dL (ref 30.0–36.0)
MCV: 79.2 fL (ref 78.0–100.0)
MONO ABS: 0.3 10*3/uL (ref 0.1–1.0)
Monocytes Relative: 2 %
Neutro Abs: 14.9 10*3/uL — ABNORMAL HIGH (ref 1.7–7.7)
Neutrophils Relative %: 88 %
PLATELETS: 254 10*3/uL (ref 150–400)
RBC: 5.09 MIL/uL (ref 3.87–5.11)
RDW: 14 % (ref 11.5–15.5)
WBC: 17 10*3/uL — AB (ref 4.0–10.5)

## 2018-03-25 LAB — CBC
HCT: 36.6 % (ref 36.0–46.0)
Hemoglobin: 12.3 g/dL (ref 12.0–15.0)
MCH: 26.7 pg (ref 26.0–34.0)
MCHC: 33.6 g/dL (ref 30.0–36.0)
MCV: 79.6 fL (ref 78.0–100.0)
PLATELETS: 225 10*3/uL (ref 150–400)
RBC: 4.6 MIL/uL (ref 3.87–5.11)
RDW: 14.3 % (ref 11.5–15.5)
WBC: 11.6 10*3/uL — AB (ref 4.0–10.5)

## 2018-03-25 LAB — URINALYSIS, ROUTINE W REFLEX MICROSCOPIC
BILIRUBIN URINE: NEGATIVE
Glucose, UA: NEGATIVE mg/dL
Hgb urine dipstick: NEGATIVE
KETONES UR: NEGATIVE mg/dL
Leukocytes, UA: NEGATIVE
Nitrite: NEGATIVE
PH: 5 (ref 5.0–8.0)
Protein, ur: NEGATIVE mg/dL
SPECIFIC GRAVITY, URINE: 1.02 (ref 1.005–1.030)

## 2018-03-25 LAB — COMPREHENSIVE METABOLIC PANEL
ALBUMIN: 3.3 g/dL — AB (ref 3.5–5.0)
ALBUMIN: 3.9 g/dL (ref 3.5–5.0)
ALT: 16 U/L (ref 14–54)
ALT: 20 U/L (ref 14–54)
AST: 15 U/L (ref 15–41)
AST: 18 U/L (ref 15–41)
Alkaline Phosphatase: 42 U/L (ref 38–126)
Alkaline Phosphatase: 37 U/L — ABNORMAL LOW (ref 38–126)
Anion gap: 9 (ref 5–15)
Anion gap: 7 (ref 5–15)
BILIRUBIN TOTAL: 0.4 mg/dL (ref 0.3–1.2)
BUN: 12 mg/dL (ref 6–20)
BUN: 14 mg/dL (ref 6–20)
CHLORIDE: 100 mmol/L — AB (ref 101–111)
CHLORIDE: 102 mmol/L (ref 101–111)
CO2: 31 mmol/L (ref 22–32)
CO2: 30 mmol/L (ref 22–32)
CREATININE: 0.58 mg/dL (ref 0.44–1.00)
CREATININE: 0.59 mg/dL (ref 0.44–1.00)
Calcium: 9.9 mg/dL (ref 8.9–10.3)
Calcium: 8.8 mg/dL — ABNORMAL LOW (ref 8.9–10.3)
GFR calc Af Amer: >60 mL/min (ref >60)
GFR calc Af Amer: >60 mL/min (ref >60)
GLUCOSE: 106 mg/dL — AB (ref 65–99)
GLUCOSE: 119 mg/dL — AB (ref 65–99)
POTASSIUM: 2.3 mmol/L — AB (ref 3.5–5.1)
POTASSIUM: 3 mmol/L — AB (ref 3.5–5.1)
Sodium: 140 mmol/L (ref 135–145)
Sodium: 139 mmol/L (ref 135–145)
TOTAL PROTEIN: 7.3 g/dL (ref 6.5–8.1)
Total Bilirubin: 0.5 mg/dL (ref 0.3–1.2)
Total Protein: 6.4 g/dL — ABNORMAL LOW (ref 6.5–8.1)

## 2018-03-25 LAB — POC URINE PREG, ED: Preg Test, Ur: NEGATIVE

## 2018-03-25 LAB — LIPASE, BLOOD: LIPASE: 31 U/L (ref 11–51)

## 2018-03-25 LAB — MAGNESIUM: MAGNESIUM: 1.9 mg/dL (ref 1.7–2.4)

## 2018-03-25 MED ORDER — PANTOPRAZOLE SODIUM 40 MG PO TBEC
40.0000 mg | DELAYED_RELEASE_TABLET | Freq: Every day | ORAL | Status: DC
  Administered 2018-03-25: 40 mg via ORAL
  Filled 2018-03-25: qty 1

## 2018-03-25 MED ORDER — ONDANSETRON HCL 4 MG/2ML IJ SOLN: 4.0000 mg | Freq: Four times a day (QID) | INTRAMUSCULAR | Status: DC | PRN

## 2018-03-25 MED ORDER — FENTANYL CITRATE (PF) 100 MCG/2ML IJ SOLN
50.0000 ug | Freq: Once | INTRAMUSCULAR | Status: AC
  Administered 2018-03-25: 50 ug via INTRAVENOUS
  Filled 2018-03-25: qty 2

## 2018-03-25 MED ORDER — IOPAMIDOL (ISOVUE-300) INJECTION 61%
100.0000 mL | Freq: Once | INTRAVENOUS | Status: AC | PRN
  Administered 2018-03-25: 100 mL via INTRAVENOUS

## 2018-03-25 MED ORDER — FENTANYL CITRATE (PF) 100 MCG/2ML IJ SOLN
50.0000 ug | INTRAMUSCULAR | Status: DC | PRN
  Administered 2018-03-25 – 2018-03-26 (×8): 50 ug via INTRAVENOUS
  Filled 2018-03-25 (×7): qty 2

## 2018-03-25 MED ORDER — LORAZEPAM 1 MG PO TABS
1.0000 mg | ORAL_TABLET | Freq: Once | ORAL | Status: AC
  Administered 2018-03-25: 1 mg via ORAL
  Filled 2018-03-25: qty 1

## 2018-03-25 MED ORDER — MAGNESIUM SULFATE 2 GM/50ML IV SOLN
2.0000 g | Freq: Once | INTRAVENOUS | Status: AC
  Administered 2018-03-25: 2 g via INTRAVENOUS
  Filled 2018-03-25: qty 50

## 2018-03-25 MED ORDER — POTASSIUM CHLORIDE CRYS ER 20 MEQ PO TBCR
40.0000 meq | EXTENDED_RELEASE_TABLET | ORAL | Status: AC
  Administered 2018-03-25 (×3): 40 meq via ORAL
  Filled 2018-03-25 (×3): qty 2

## 2018-03-25 MED ORDER — POTASSIUM CHLORIDE 10 MEQ/100ML IV SOLN
10.0000 meq | Freq: Once | INTRAVENOUS | Status: AC
  Administered 2018-03-25: 10 meq via INTRAVENOUS
  Filled 2018-03-25: qty 100

## 2018-03-25 MED ORDER — FENTANYL CITRATE (PF) 100 MCG/2ML IJ SOLN
100.0000 ug | Freq: Once | INTRAMUSCULAR | Status: AC
  Administered 2018-03-25: 100 ug via INTRAVENOUS
  Filled 2018-03-25: qty 2

## 2018-03-25 MED ORDER — ONDANSETRON HCL 4 MG/2ML IJ SOLN
4.0000 mg | Freq: Once | INTRAMUSCULAR | Status: AC
  Administered 2018-03-25: 4 mg via INTRAVENOUS
  Filled 2018-03-25: qty 2

## 2018-03-25 MED ORDER — SODIUM CHLORIDE 0.9 % IV BOLUS
1000.0000 mL | Freq: Once | INTRAVENOUS | Status: AC
Start: 2018-03-25 — End: 2018-03-25
  Administered 2018-03-25: 1000 mL via INTRAVENOUS

## 2018-03-25 MED ORDER — ALUM & MAG HYDROXIDE-SIMETH 200-200-20 MG/5ML PO SUSP
30.0000 mL | ORAL | Status: DC | PRN
  Administered 2018-03-25: 30 mL via ORAL
  Filled 2018-03-25: qty 30

## 2018-03-25 MED ORDER — ACETAMINOPHEN 650 MG RE SUPP: 650.0000 mg | Freq: Four times a day (QID) | RECTAL | Status: DC | PRN

## 2018-03-25 MED ORDER — SODIUM CHLORIDE 0.9 % IV SOLN
INTRAVENOUS | Status: DC
  Administered 2018-03-25 – 2018-03-26 (×2): via INTRAVENOUS

## 2018-03-25 MED ORDER — PIPERACILLIN-TAZOBACTAM 3.375 G IVPB
3.3750 g | Freq: Three times a day (TID) | INTRAVENOUS | Status: DC
  Administered 2018-03-25 – 2018-03-26 (×3): 3.375 g via INTRAVENOUS
  Filled 2018-03-25 (×3): qty 50

## 2018-03-25 MED ORDER — ACETAMINOPHEN 325 MG PO TABS: 650.0000 mg | ORAL_TABLET | Freq: Four times a day (QID) | ORAL | Status: DC | PRN

## 2018-03-25 MED ORDER — ENOXAPARIN SODIUM 40 MG/0.4ML ~~LOC~~ SOLN
40.0000 mg | SUBCUTANEOUS | Status: DC
  Administered 2018-03-25: 40 mg via SUBCUTANEOUS
  Filled 2018-03-25: qty 0.4

## 2018-03-25 MED ORDER — PIPERACILLIN-TAZOBACTAM 3.375 G IVPB 30 MIN
3.3750 g | Freq: Once | INTRAVENOUS | Status: AC
  Administered 2018-03-25: 3.375 g via INTRAVENOUS
  Filled 2018-03-25: qty 50

## 2018-03-25 MED ORDER — POTASSIUM CHLORIDE 10 MEQ/100ML IV SOLN
10.0000 meq | INTRAVENOUS | Status: AC
  Administered 2018-03-25 (×2): 10 meq via INTRAVENOUS
  Filled 2018-03-25 (×2): qty 100

## 2018-03-25 NOTE — Consult Note (Signed)
Reason for Consult: Right lower quadrant abdominal pain Referring Physician: Dr. Rubbie Battiest is an 37 y.o. female.  HPI: Patient is a 37 year old white female with a 1 week history of diarrhea and lower abdominal pain who presents the emergency room yesterday with worsening right lower quadrant abdominal pain.  CT scan of the abdomen revealed enteritis, but the tip of the appendix was noted to be involved in that region.  Some dilatation was noted of the appendix.  Patient denies any fever or chills.  She does have some nausea.  She is continued to have diarrhea.  Past Medical History:  Diagnosis Date  . Anxiety   . Depression   . Enlarged heart   . Fibroid 04/24/2016  . GERD (gastroesophageal reflux disease)   . Leaky heart valve   . Menorrhagia with irregular cycle 04/13/2016  . Multiple sclerosis ([place])    Dr Lonzo Cloud  . Multiple sclerosis exacerbation ([place])   . Numbness in right leg 02/13,12/12  . Obesity (BMI 35.0-39.9 without comorbidity)   . Other malaise and fatigue 10/20/2013  . Ovarian cyst 04/24/2016  . Regurgitation    cardiac valve. echo 2013 for first eval GILENYA,mod MR,TR    Past Surgical History:  Procedure Laterality Date  . BACK SURGERY  2006  . CESAREAN SECTION  2000  . CHOLECYSTECTOMY  2010  . COLONOSCOPY  09/14/2010   Fields-hyperplastic polyps, int hemorrhoids  . CYST EXCISION  2006   pilonoidal  . DILITATION & CURRETTAGE/HYSTROSCOPY WITH NOVASURE ABLATION N/A 07/21/2016   Procedure: DILATATION & CURETTAGE/HYSTEROSCOPY WITH NOVASURE ABLATION;  Surgeon: Jonnie Kind, MD;  Location: [facility];  Service: Gynecology;  Laterality: N/A;  Novasure lenght - 5.0 width - 4.1 power - 113 time - 2 min 03 seconds  . ESOPHAGOGASTRODUODENOSCOPY  08/05/2008   Fields-incomplete Schatzki's ring, distal esophageal erosions/esophagitis(mild), small HH, NEGATIVE h pylori  . LAPAROSCOPIC BILATERAL SALPINGECTOMY Bilateral 07/21/2016   Procedure: LAPAROSCOPIC RIGHT  SALPINGECTOMY AND LEFT OOPHECTOMY;  Surgeon: Jonnie Kind, MD;  Location: [facility];  Service: Gynecology;  Laterality: Bilateral;  . LUMBAR DISC SURGERY    . US ECHOCARDIOGRAPHY  03/19/2012   RV mildly dilated,RA mod. dilated,strial septum aneurysmal,Mod. MR & TR    Family History  Problem Relation Age of Onset  . Other Mother        lost leg, has trouble swallowing  . Thyroid nodules Mother   . Heart attack Father   . Coronary artery disease Father   . Diabetes Father   . Rheum arthritis Father   . Colon cancer Paternal Uncle 41  . Colon cancer Paternal Grandmother 26  . Diabetes Paternal Grandmother   . Cancer Paternal Grandmother        breast,lung  . Cancer Maternal Grandmother 41       kind unknown  . Diabetes Maternal Grandmother   . Myasthenia gravis Maternal Grandfather   . Other Sister        tumor on ovary  . ADD / ADHD Son   . Heart attack Paternal Grandfather   . Multiple sclerosis Other        paternal great aunt    Social History:  reports that she quit smoking about 6 years ago. Her smoking use included cigarettes. She has a 20.00 pack-year smoking history. She has never used smokeless tobacco. She reports that she does not drink alcohol or use drugs.  Allergies:  Allergies  Allergen Reactions  . Diclofenac Anaphylaxis  . Propoxyphene N-Acetaminophen  Rash    Medications: I have reviewed the patient's current medications.  Results for orders placed or performed during the hospital encounter of 03/24/18 (from the past 48 hour(s))  Urinalysis, Routine w reflex microscopic     Status: None   Collection Time: 03/25/18 12:05 AM  Result Value Ref Range   Color, Urine YELLOW YELLOW   APPearance CLEAR CLEAR   Specific Gravity, Urine 1.020 1.005 - 1.030   pH 5.0 5.0 - 8.0   Glucose, UA NEGATIVE NEGATIVE mg/dL   Hgb urine dipstick NEGATIVE NEGATIVE   Bilirubin Urine NEGATIVE NEGATIVE   Ketones, ur NEGATIVE NEGATIVE mg/dL   Protein, ur NEGATIVE NEGATIVE mg/dL    Nitrite NEGATIVE NEGATIVE   Leukocytes, UA NEGATIVE NEGATIVE    Comment: Performed at [facility], [address] [place], [place] [address]  POC urine preg, ED (not at [facility]     Status: None   Collection Time: 03/25/18 12:37 AM  Result Value Ref Range   Preg Test, Ur NEGATIVE NEGATIVE    Comment:        THE SENSITIVITY OF THIS METHODOLOGY IS >24 mIU/mL   Comprehensive metabolic panel     Status: Abnormal   Collection Time: 03/25/18 12:39 AM  Result Value Ref Range   Sodium 140 135 - 145 mmol/L   Potassium 2.3 (LL) 3.5 - 5.1 mmol/L    Comment: CRITICAL RESULT CALLED TO, READ BACK BY AND VERIFIED WITH: LONG,H @ 0144 ON 6.3.19 BY BOWMAN,L    Chloride 100 (L) 101 - 111 mmol/L   CO2 31 22 - 32 mmol/L   Glucose, Bld 106 (H) 65 - 99 mg/dL   BUN 14 6 - 20 mg/dL   Creatinine, Ser 0.58 0.44 - 1.00 mg/dL   Calcium 9.9 8.9 - 10.3 mg/dL   Total Protein 7.3 6.5 - 8.1 g/dL   Albumin 3.9 3.5 - 5.0 g/dL   AST 18 15 - 41 U/L   ALT 20 14 - 54 U/L   Alkaline Phosphatase 42 38 - 126 U/L   Total Bilirubin 0.4 0.3 - 1.2 mg/dL   GFR calc non Af Amer >60 >60 mL/min   GFR calc Af Amer >60 >60 mL/min    Comment: (NOTE) The eGFR has been calculated using the CKD EPI equation. This calculation has not been validated in all clinical situations. eGFR's persistently <60 mL/min signify possible Chronic Kidney Disease.    Anion gap 9 5 - 15    Comment: Performed at [facility], [address] [place], [place] [address]  CBC with Differential/Platelet     Status: Abnormal   Collection Time: 03/25/18 12:39 AM  Result Value Ref Range   WBC 17.0 (H) 4.0 - 10.5 K/uL   RBC 5.09 3.87 - 5.11 MIL/uL   Hemoglobin 14.0 12.0 - 15.0 g/dL   HCT 40.3 36.0 - 46.0 %   MCV 79.2 78.0 - 100.0 fL   MCH 27.5 26.0 - 34.0 pg   MCHC 34.7 30.0 - 36.0 g/dL   RDW 14.0 11.5 - 15.5 %   Platelets 254 150 - 400 K/uL   Neutrophils Relative % 88 %   Neutro Abs 14.9 (H) 1.7 - 7.7 K/uL   Lymphocytes Relative 9 %   Lymphs Abs  1.5 0.7 - 4.0 K/uL   Monocytes Relative 2 %   Monocytes Absolute 0.3 0.1 - 1.0 K/uL   Eosinophils Relative 1 %   Eosinophils Absolute 0.2 0.0 - 0.7 K/uL   Basophils Relative 0 %  Basophils Absolute 0.0 0.0 - 0.1 K/uL    Comment: Performed at [facility], [address], [place], [place] [address]  Lipase, blood     Status: None   Collection Time: 03/25/18 12:39 AM  Result Value Ref Range   Lipase 31 11 - 51 U/L    Comment: Performed at [facility], [address] [place], [place] [address]  Magnesium     Status: None   Collection Time: 03/25/18  7:00 AM  Result Value Ref Range   Magnesium 1.9 1.7 - 2.4 mg/dL    Comment: Performed at [facility], [address], [place], [place] [address]  CBC     Status: Abnormal   Collection Time: 03/25/18  7:00 AM  Result Value Ref Range   WBC 11.6 (H) 4.0 - 10.5 K/uL   RBC 4.60 3.87 - 5.11 MIL/uL   Hemoglobin 12.3 12.0 - 15.0 g/dL   HCT 36.6 36.0 - 46.0 %   MCV 79.6 78.0 - 100.0 fL   MCH 26.7 26.0 - 34.0 pg   MCHC 33.6 30.0 - 36.0 g/dL   RDW 14.3 11.5 - 15.5 %   Platelets 225 150 - 400 K/uL    Comment: Performed at [facility], [address], [place], [place] [address]  Comprehensive metabolic panel     Status: Abnormal   Collection Time: 03/25/18  7:00 AM  Result Value Ref Range   Sodium 139 135 - 145 mmol/L   Potassium 3.0 (L) 3.5 - 5.1 mmol/L    Comment: DELTA CHECK NOTED   Chloride 102 101 - 111 mmol/L   CO2 30 22 - 32 mmol/L   Glucose, Bld 119 (H) 65 - 99 mg/dL   BUN 12 6 - 20 mg/dL   Creatinine, Ser 0.59 0.44 - 1.00 mg/dL   Calcium 8.8 (L) 8.9 - 10.3 mg/dL   Total Protein 6.4 (L) 6.5 - 8.1 g/dL   Albumin 3.3 (L) 3.5 - 5.0 g/dL   AST 15 15 - 41 U/L   ALT 16 14 - 54 U/L   Alkaline Phosphatase 37 (L) 38 - 126 U/L   Total Bilirubin 0.5 0.3 - 1.2 mg/dL   GFR calc non Af Amer >60 >60 mL/min   GFR calc Af Amer >60 >60 mL/min    Comment: (NOTE) The eGFR has been calculated using the CKD EPI equation. This calculation has not  been validated in all clinical situations. eGFR's persistently <60 mL/min signify possible Chronic Kidney Disease.    Anion gap 7 5 - 15    Comment: Performed at [facility], [address] [place], [place] [address]    Ct Abdomen Pelvis W Contrast  Result Date: 03/25/2018 CLINICAL DATA:  Abdominal pain radiating to back, severe abdominal cramping after eating salad this afternoon. History of reflux disease, cholecystectomy, back surgery. EXAM: CT ABDOMEN AND PELVIS WITH CONTRAST TECHNIQUE: Multidetector CT imaging of the abdomen and pelvis was performed using the standard protocol following bolus administration of intravenous contrast. CONTRAST:  126m ISOVUE-300 IOPAMIDOL (ISOVUE-300) INJECTION 61% COMPARISON:  None. FINDINGS: LOWER CHEST: Lung bases are clear. Included heart size is normal. No pericardial effusion. HEPATOBILIARY: Status post cholecystectomy.  Normal liver. PANCREAS: Normal. SPLEEN: Normal. ADRENALS/URINARY TRACT: Kidneys are orthotopic, demonstrating symmetric enhancement. No nephrolithiasis, hydronephrosis or solid renal masses. The unopacified ureters are normal in course and caliber. Urinary bladder is partially distended and unremarkable. Normal adrenal glands. STOMACH/BOWEL: Single loop of small bowel wall thickening with associated fat stranding and mesenteric edema. Small  hiatal hernia. The stomach, large bowel are normal in course and caliber without inflammatory changes. Mild colonic diverticulosis. Mild amount of retained large bowel stool. Appendix tip abuts the inflamed small bowel and is 9 mm in transaxial dimension (series 5, image 57. VASCULAR/LYMPHATIC: Aortoiliac vessels are normal in course and caliber. No lymphadenopathy by CT size criteria. REPRODUCTIVE: Normal. OTHER: No intraperitoneal free fluid or free air. MUSCULOSKELETAL: Nonacute. Severe L4-5 disc height loss with vacuum disc and endplate spurring compatible with degenerative disc resulting in mild LEFT neural  foraminal narrowing. IMPRESSION: 1. Focal small bowel enteritis may be infectious or inflammatory. Appendiceal tip abuts the loop of small bowel and tip appendicitis is also possible. 2. Acute findings discussed with and reconfirmed by Robbie Louis on 03/25/2018 at 3:36 am. Electronically Signed   By: Elon Alas M.D.   On: 03/25/2018 03:37    ROS:  Pertinent items are noted in HPI.  Blood pressure 104/64, pulse 83, temperature 98.3 F (36.8 C), temperature source Oral, resp. rate 16, height 5' 8"  (1.727 m), weight 250 lb (113.4 kg), SpO2 96 %. Physical Exam: Well-developed well-nourished white female no acute distress Head is normocephalic, atraumatic Lungs clear to auscultation with equal breath sounds bilaterally Heart examination reveals regular rate and rhythm without S3, S4, murmurs Abdomen soft with nonspecific tenderness in the lower abdomen and to the right.  No rigidity is noted.  No hepatosplenomegaly is noted.  CT scan images personally reviewed  Assessment/Plan: Impression: Probable enteritis with periappendiceal inflammation.  Hypokalemia. Plan: Hypokalemia needs to be addressed prior to any surgical intervention.  No acute surgical intervention warranted at this time.  Would start IV hydration and IV Zosyn.  Will reassess tomorrow in a.m.  Aviva Signs 03/25/2018, 8:41 AM

## 2018-03-25 NOTE — Progress Notes (Signed)
Patient admitted to the hospital earlier this morning by Dr. Darrick Meigs.  Patient seen and examined.  Continues to have abdominal pain.  No vomiting.  Abdomen is soft, tender on the right side  Admitted with abdominal pain.  CT scan indicates enteritis versus appendicitis. Started on intravenous antibiotics.  General surgery following.  She also has significant hypokalemia that is improving with replacement.  Continue to follow.  [employer]

## 2018-03-25 NOTE — ED Notes (Signed)
CRITICAL VALUE ALERT  Critical Value:  Potassium 2.3  Date & Time Notied:  03/25/18  Provider Notified: Christy Gentles, EDP  Orders Received/Actions taken: No orders at this time

## 2018-03-25 NOTE — H&P (Addendum)
TRH H&P    Patient Demographics:    [name], is a 37 y.o. female  MRN: [id number]  DOB - 03/25/1981  Admit Date - 03/24/2018  Referring MD/NP/PA: Dr Christy Gentles  Outpatient Primary MD for the patient is Minta Balsam, MD  Patient coming from: Home   Chief complaint- abdominal  pain   HPI:    [name]  is a 37 y.o. female,  with history of multiple sclerosis came to hospital with right lower quadrant abdominal pain which started around 3 PM on Sunday.  Patient denies nausea vomiting.  She had diarrhea earlier during the week which lasted for 2 days.  She denies diarrhea at this time. Denies chest pain or shortness of breath. Denies fever or chills. In the ED CT scan of the abdomen pelvis was done which showed enteritis and questionable appendicitis. General surgery was consulted, and recommended patient to be admitted and started on antibiotics. Patient started on IV Zosyn.  No previous history of stroke or seizures. Denies passing out.  No blurred vision.   Review of systems:      All other systems reviewed and are negative.   With Past History of the following :    Past Medical History:  Diagnosis Date  . Anxiety   . Depression   . Enlarged heart   . Fibroid 04/24/2016  . GERD (gastroesophageal reflux disease)   . Leaky heart valve   . Menorrhagia with irregular cycle 04/13/2016  . Multiple sclerosis ([place])    Dr Lonzo Cloud  . Multiple sclerosis exacerbation ([place])   . Numbness in right leg 02/13,12/12  . Obesity (BMI 35.0-39.9 without comorbidity)   . Other malaise and fatigue 10/20/2013  . Ovarian cyst 04/24/2016  . Regurgitation    cardiac valve. echo 2013 for first eval GILENYA,mod MR,TR      Past Surgical History:  Procedure Laterality Date  . BACK SURGERY  2006  . CESAREAN SECTION  2000  . CHOLECYSTECTOMY  2010  . COLONOSCOPY  09/14/2010   Fields-hyperplastic polyps, int  hemorrhoids  . CYST EXCISION  2006   pilonoidal  . DILITATION & CURRETTAGE/HYSTROSCOPY WITH NOVASURE ABLATION N/A 07/21/2016   Procedure: DILATATION & CURETTAGE/HYSTEROSCOPY WITH NOVASURE ABLATION;  Surgeon: Jonnie Kind, MD;  Location: [facility];  Service: Gynecology;  Laterality: N/A;  Novasure lenght - 5.0 width - 4.1 power - 113 time - 2 min 03 seconds  . ESOPHAGOGASTRODUODENOSCOPY  08/05/2008   Fields-incomplete Schatzki's ring, distal esophageal erosions/esophagitis(mild), small HH, NEGATIVE h pylori  . LAPAROSCOPIC BILATERAL SALPINGECTOMY Bilateral 07/21/2016   Procedure: LAPAROSCOPIC RIGHT SALPINGECTOMY AND LEFT OOPHECTOMY;  Surgeon: Jonnie Kind, MD;  Location: [facility];  Service: Gynecology;  Laterality: Bilateral;  . LUMBAR DISC SURGERY    . US ECHOCARDIOGRAPHY  03/19/2012   RV mildly dilated,RA mod. dilated,strial septum aneurysmal,Mod. MR & TR      Social History:      Social History   Tobacco Use  . Smoking status: Former Smoker    Packs/day: 1.00    Years: 20.00  Pack years: 20.00    Types: Cigarettes    Last attempt to quit: 02/15/2012    Years since quitting: 6.1  . Smokeless tobacco: Never Used  Substance Use Topics  . Alcohol use: No    Alcohol/week: 0.0 oz       Family History :     Family History  Problem Relation Age of Onset  . Other Mother        lost leg, has trouble swallowing  . Thyroid nodules Mother   . Heart attack Father   . Coronary artery disease Father   . Diabetes Father   . Rheum arthritis Father   . Colon cancer Paternal Uncle 79  . Colon cancer Paternal Grandmother 63  . Diabetes Paternal Grandmother   . Cancer Paternal Grandmother        breast,lung  . Cancer Maternal Grandmother 41       kind unknown  . Diabetes Maternal Grandmother   . Myasthenia gravis Maternal Grandfather   . Other Sister        tumor on ovary  . ADD / ADHD Son   . Heart attack Paternal Grandfather   . Multiple sclerosis Other        paternal  great aunt      Home Medications:   Prior to Admission medications   Medication Sig Start Date End Date Taking? Authorizing Provider  ALPRAZolam Duanne Moron) 1 MG tablet Take 0.5 tablets (0.5 mg total) by mouth 2 (two) times daily. 02/05/18   Ward Givens, NP  chlorthalidone (HYGROTON) 25 MG tablet  12/11/17   [provider]  cyclobenzaprine (FLEXERIL) 10 MG tablet Take 10 mg by mouth 3 (three) times daily as needed for muscle spasms.    [provider]  dalfampridine 10 MG TB12 TAKE 1 TABLET BY MOUTH TWICE A DAY 10/29/17   Dohmeier, Asencion Partridge, MD  Peginterferon Beta-1a (PLEGRIDY) 125 MCG/0.5ML SOPN INJECT 125MCG INTO THE SKIN EVERY 14 DAYS 12/21/17   Dohmeier, Asencion Partridge, MD  ranitidine (ZANTAC) 150 MG tablet Take 150 mg by mouth 2 (two) times daily.    [provider]     Allergies:     Allergies  Allergen Reactions  . Diclofenac Anaphylaxis  . Propoxyphene N-Acetaminophen Rash     Physical Exam:   Vitals  Blood pressure 108/62, pulse 81, temperature 98.3 F (36.8 C), temperature source Oral, resp. rate 16, height 5\' 8"  (1.727 m), weight 113.4 kg (250 lb), SpO2 95 %.  1.  General: Appears in no acute distress  2. Psychiatric:  Intact judgement and  insight, awake alert, oriented x 3.  3. Neurologic: No focal neurological deficits, all cranial nerves intact.Strength 5/5 all 4 extremities, sensation intact all 4 extremities, plantars down going.  4. Eyes :  anicteric sclerae, moist conjunctivae with no lid lag. PERRLA.  5. ENMT:  Oropharynx clear with moist mucous membranes and good dentition  6. Neck:  supple, no cervical lymphadenopathy appriciated, No thyromegaly  7. Respiratory : Normal respiratory effort, good air movement bilaterally,clear to  auscultation bilaterally  8. Cardiovascular : RRR, no gallops, rubs or murmurs, no leg edema  9. Gastrointestinal:  Positive bowel sounds, abdomen soft, tender to palpation in right lower quadrant,no  hepatosplenomegaly, no rigidity or guarding       10. Skin:  No cyanosis, normal texture and turgor, no rash, lesions or ulcers  11.Musculoskeletal:  Good muscle tone,  joints appear normal , no effusions,  normal range of motion    Data  Review:    CBC Recent Labs  Lab 03/25/18 0039  WBC 17.0*  HGB 14.0  HCT 40.3  PLT 254  MCV 79.2  MCH 27.5  MCHC 34.7  RDW 14.0  LYMPHSABS 1.5  MONOABS 0.3  EOSABS 0.2  BASOSABS 0.0   ------------------------------------------------------------------------------------------------------------------  Chemistries  Recent Labs  Lab 03/25/18 0039  NA 140  K 2.3*  CL 100*  CO2 31  GLUCOSE 106*  BUN 14  CREATININE 0.58  CALCIUM 9.9  AST 18  ALT 20  ALKPHOS 42  BILITOT 0.4   ------------------------------------------------------------------------------------------------------------------  ------------------------------------------------------------------------------------------------------------------ GFR: Estimated Creatinine Clearance: 126 mL/min (by C-G formula based on SCr of 0.58 mg/dL). Liver Function Tests: Recent Labs  Lab 03/25/18 0039  AST 18  ALT 20  ALKPHOS 42  BILITOT 0.4  PROT 7.3  ALBUMIN 3.9   Recent Labs  Lab 03/25/18 0039  LIPASE 31   No results for input(s): AMMONIA in the last 168 hours. Coagulation Profile: No results for input(s): INR, PROTIME in the last 168 hours. Cardiac Enzymes: No results for input(s): CKTOTAL, CKMB, CKMBINDEX, TROPONINI in the last 168 hours. BNP (last 3 results) No results for input(s): PROBNP in the last 8760 hours. HbA1C: No results for input(s): HGBA1C in the last 72 hours. CBG: No results for input(s): GLUCAP in the last 168 hours. Lipid Profile: No results for input(s): CHOL, HDL, LDLCALC, TRIG, CHOLHDL, LDLDIRECT in the last 72 hours. Thyroid Function Tests: No results for input(s): TSH, T4TOTAL, FREET4, T3FREE, THYROIDAB in the last 72 hours. Anemia  Panel: No results for input(s): VITAMINB12, FOLATE, FERRITIN, TIBC, IRON, RETICCTPCT in the last 72 hours.  --------------------------------------------------------------------------------------------------------------- Urine analysis:    Component Value Date/Time   COLORURINE YELLOW 03/25/2018 0005   APPEARANCEUR CLEAR 03/25/2018 0005   APPEARANCEUR Cloudy (A) 04/05/2016 1634   LABSPEC 1.020 03/25/2018 0005   PHURINE 5.0 03/25/2018 0005   GLUCOSEU NEGATIVE 03/25/2018 0005   HGBUR NEGATIVE 03/25/2018 0005   BILIRUBINUR NEGATIVE 03/25/2018 0005   BILIRUBINUR Negative 04/05/2016 1634   KETONESUR NEGATIVE 03/25/2018 0005   PROTEINUR NEGATIVE 03/25/2018 0005   UROBILINOGEN 0.2 01/14/2014 2127   NITRITE NEGATIVE 03/25/2018 0005   LEUKOCYTESUR NEGATIVE 03/25/2018 0005   LEUKOCYTESUR Negative 04/05/2016 1634      Imaging Results:    Ct Abdomen Pelvis W Contrast  Result Date: 03/25/2018 CLINICAL DATA:  Abdominal pain radiating to back, severe abdominal cramping after eating salad this afternoon. History of reflux disease, cholecystectomy, back surgery. EXAM: CT ABDOMEN AND PELVIS WITH CONTRAST TECHNIQUE: Multidetector CT imaging of the abdomen and pelvis was performed using the standard protocol following bolus administration of intravenous contrast. CONTRAST:  118mL ISOVUE-300 IOPAMIDOL (ISOVUE-300) INJECTION 61% COMPARISON:  None. FINDINGS: LOWER CHEST: Lung bases are clear. Included heart size is normal. No pericardial effusion. HEPATOBILIARY: Status post cholecystectomy.  Normal liver. PANCREAS: Normal. SPLEEN: Normal. ADRENALS/URINARY TRACT: Kidneys are orthotopic, demonstrating symmetric enhancement. No nephrolithiasis, hydronephrosis or solid renal masses. The unopacified ureters are normal in course and caliber. Urinary bladder is partially distended and unremarkable. Normal adrenal glands. STOMACH/BOWEL: Single loop of small bowel wall thickening with associated fat stranding and  mesenteric edema. Small hiatal hernia. The stomach, large bowel are normal in course and caliber without inflammatory changes. Mild colonic diverticulosis. Mild amount of retained large bowel stool. Appendix tip abuts the inflamed small bowel and is 9 mm in transaxial dimension (series 5, image 57. VASCULAR/LYMPHATIC: Aortoiliac vessels are normal in course and caliber. No lymphadenopathy by CT size criteria. REPRODUCTIVE: Normal.  OTHER: No intraperitoneal free fluid or free air. MUSCULOSKELETAL: Nonacute. Severe L4-5 disc height loss with vacuum disc and endplate spurring compatible with degenerative disc resulting in mild LEFT neural foraminal narrowing. IMPRESSION: 1. Focal small bowel enteritis may be infectious or inflammatory. Appendiceal tip abuts the loop of small bowel and tip appendicitis is also possible. 2. Acute findings discussed with and reconfirmed by Robbie Louis on 03/25/2018 at 3:36 am. Electronically Signed   By: Elon Alas M.D.   On: 03/25/2018 03:37    My personal review of EKG: Rhythm NSR, Rate 95 /min, QTc 578,no Acute ST changes   Assessment & Plan:    Active Problems:   Multiple sclerosis (HCC)   Enteritis   1. Enteritis-patient started on IV Zosyn.  Will continue with Zosyn.  Keep her n.p.o.  General surgery has been consulted by ED physician.  Dr. Arnoldo Morale to see patient in a.m. 2. ?  Appendicitis-CT scan showed appendiceal tip abuts the loop of small bowel and tip appendicitis is also possible.  Keep her n.p.o. as above.  Continue IV antibiotics. 3. Hypokalemia-potassium was 2.3, will replace potassium and check BMP in a.m. 4. Multiple sclerosis-hold home medications at this time.  Consider restarting medications once patient is more stable and okay with surgery 5. Prolonged QTC-QTC is prolonged 578, patient given magnesium in the ED.  Monitor patient on telemetry.  Will check serum magnesium level.    DVT Prophylaxis-   Lovenox   AM Labs Ordered, also  please review Full Orders  Family Communication: Admission, patients condition and plan of care including tests being ordered have been discussed with the patient  who indicate understanding and agree with the plan and Code Status.  Code Status:  Full code  Admission status: Inpatient    Time spent in minutes : 60 min   Oswald Hillock M.D on 03/25/2018 at 4:39 AM  Between 7am to 7pm - Pager - [phone]. After 7pm go to [email or web address] - password [facility]  [facility] - Office  [phone]

## 2018-03-25 NOTE — ED Provider Notes (Signed)
[facility] EMERGENCY DEPARTMENT Provider Note   CSN: [id number] Arrival date & time: 03/24/18  2257     History   Chief Complaint Chief Complaint  Patient presents with  . Abdominal Pain   Pt seen with NP student, I performed history/physical/documentation  HPI [name] is a 37 y.o. female.  The history is provided by the patient.  Abdominal Pain   This is a new problem. The current episode started 6 to 12 hours ago. The problem occurs constantly. The problem has been gradually worsening. The pain is located in the RLQ. The pain is severe. Associated symptoms include nausea. Pertinent negatives include fever, vomiting and dysuria. The symptoms are aggravated by palpation. Nothing relieves the symptoms.   Reports onset of right lower quadrant abdominal pain several hours ago while eating.  No fever or vomiting.  She does not recall having this pain previously. Past Medical History:  Diagnosis Date  . Anxiety   . Depression   . Enlarged heart   . Fibroid 04/24/2016  . GERD (gastroesophageal reflux disease)   . Leaky heart valve   . Menorrhagia with irregular cycle 04/13/2016  . Multiple sclerosis ([place])    Dr Lonzo Cloud  . Multiple sclerosis exacerbation ([place])   . Numbness in right leg 02/13,12/12  . Obesity (BMI 35.0-39.9 without comorbidity)   . Other malaise and fatigue 10/20/2013  . Ovarian cyst 04/24/2016  . Regurgitation    cardiac valve. echo 2013 for first eval GILENYA,mod MR,TR    Patient Active Problem List   Diagnosis Date Noted  . Sciatica of right side associated with disorder of lumbosacral spine 05/09/2017  . Status post lumbar spinal fusion 05/09/2017  . Encounter for sterilization education 06/21/2016  . Menorrhagia with regular cycle 06/21/2016  . Fibroid 04/24/2016  . Ovarian cyst 04/24/2016  . Menorrhagia with irregular cycle 04/13/2016  . Frontal sinusitis 09/30/2015  . Photosensitivity 03/30/2015  . Vertigo, aural 03/30/2015  . Morbid  obesity ([place]) 10/19/2014  . Diplopia 10/19/2014  . MS (multiple sclerosis) ([place]) 06/17/2014  . Abnormality of gait 06/17/2014  . Other malaise and fatigue 10/20/2013  . Insomnia 08/06/2013  . Mitral insufficiency 05/17/2013  . Tricuspid insufficiency 05/17/2013  . Multiple sclerosis ([place]) 01/23/2013  . Severe obesity (BMI >= 40) ([place]) 01/23/2013  . 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 Depression with anxiety 01/23/2013  . Anemia 02/22/2012  . Hematochezia 02/22/2012  . Globus sensation 02/22/2012  . Dysphagia 02/22/2012  . GERD 09/01/2010  . CONSTIPATION, CHRONIC 09/01/2010    Past Surgical History:  Procedure Laterality Date  . BACK SURGERY  2006  . CESAREAN SECTION  2000  . CHOLECYSTECTOMY  2010  . COLONOSCOPY  09/14/2010   Fields-hyperplastic polyps, int hemorrhoids  . CYST EXCISION  2006   pilonoidal  . DILITATION & CURRETTAGE/HYSTROSCOPY WITH NOVASURE ABLATION N/A 07/21/2016   Procedure: DILATATION & CURETTAGE/HYSTEROSCOPY WITH NOVASURE ABLATION;  Surgeon: Jonnie Kind, MD;  Location: [facility];  Service: Gynecology;  Laterality: N/A;  Novasure lenght - 5.0 width - 4.1 power - 113 time - 2 min 03 seconds  . ESOPHAGOGASTRODUODENOSCOPY  08/05/2008   Fields-incomplete Schatzki's ring, distal esophageal erosions/esophagitis(mild), small HH, NEGATIVE h pylori  . LAPAROSCOPIC BILATERAL SALPINGECTOMY Bilateral 07/21/2016   Procedure: LAPAROSCOPIC RIGHT SALPINGECTOMY AND LEFT OOPHECTOMY;  Surgeon: Jonnie Kind, MD;  Location: [facility];  Service: Gynecology;  Laterality: Bilateral;  . LUMBAR DISC SURGERY    . US ECHOCARDIOGRAPHY  03/19/2012   RV mildly dilated,RA mod. dilated,strial  septum aneurysmal,Mod. MR & TR     OB History    Gravida  1   Para  1   Term      Preterm      AB      Living  1     SAB      TAB      Ectopic      Multiple      Live Births               Home Medications    Prior to Admission medications   Medication Sig Start Date  End Date Taking? Authorizing Provider  ALPRAZolam Duanne Moron) 1 MG tablet Take 0.5 tablets (0.5 mg total) by mouth 2 (two) times daily. 02/05/18   Ward Givens, NP  chlorthalidone (HYGROTON) 25 MG tablet  12/11/17   [provider]  cyclobenzaprine (FLEXERIL) 10 MG tablet Take 10 mg by mouth 3 (three) times daily as needed for muscle spasms.    [provider]  dalfampridine 10 MG TB12 TAKE 1 TABLET BY MOUTH TWICE A DAY 10/29/17   Dohmeier, Asencion Partridge, MD  Peginterferon Beta-1a (PLEGRIDY) 125 MCG/0.5ML SOPN INJECT 125MCG INTO THE SKIN EVERY 14 DAYS 12/21/17   Dohmeier, Asencion Partridge, MD  ranitidine (ZANTAC) 150 MG tablet Take 150 mg by mouth 2 (two) times daily.    [provider]    Family History Family History  Problem Relation Age of Onset  . Other Mother        lost leg, has trouble swallowing  . Thyroid nodules Mother   . Heart attack Father   . Coronary artery disease Father   . Diabetes Father   . Rheum arthritis Father   . Colon cancer Paternal Uncle 49  . Colon cancer Paternal Grandmother 44  . Diabetes Paternal Grandmother   . Cancer Paternal Grandmother        breast,lung  . Cancer Maternal Grandmother 32       kind unknown  . Diabetes Maternal Grandmother   . Myasthenia gravis Maternal Grandfather   . Other Sister        tumor on ovary  . ADD / ADHD Son   . Heart attack Paternal Grandfather   . Multiple sclerosis Other        paternal great aunt    Social History Social History   Tobacco Use  . Smoking status: Former Smoker    Packs/day: 1.00    Years: 20.00    Pack years: 20.00    Types: Cigarettes    Last attempt to quit: 02/15/2012    Years since quitting: 6.1  . Smokeless tobacco: Never Used  Substance Use Topics  . Alcohol use: No    Alcohol/week: 0.0 oz  . Drug use: No     Allergies   Diclofenac and Propoxyphene n-acetaminophen   Review of Systems Review of Systems  Constitutional: Negative for fever.  Gastrointestinal: Positive  for abdominal pain and nausea. Negative for vomiting.  Genitourinary: Negative for dysuria.  All other systems reviewed and are negative.    Physical Exam Updated Vital Signs BP 123/86 (BP Location: Right Arm)   Pulse 85   Temp (!) 97.4 F (36.3 C) (Temporal)   Resp 18   Ht 1.727 m (5\' 8" )   Wt 113.4 kg (250 lb)   SpO2 100%   BMI 38.01 kg/m   Physical Exam CONSTITUTIONAL: Well developed/well nourished HEAD: Normocephalic/atraumatic EYES: EOMI ENMT: Mucous membranes moist NECK: supple no meningeal signs SPINE/BACK:entire  spine nontender CV: S1/S2 noted, no murmurs/rubs/gallops noted LUNGS: Lungs are clear to auscultation bilaterally, no apparent distress ABDOMEN: soft, moderate right lower quadrant tenderness, no rebound or guarding, bowel sounds noted throughout abdomen GU:no cva tenderness NEURO: Pt is awake/alert/appropriate, moves all extremitiesx4.     EXTREMITIES: pulses normal/equal, full ROM SKIN: warm, color normal PSYCH: no abnormalities of mood noted, alert and oriented to situation   ED Treatments / Results  Labs (all labs ordered are listed, but only abnormal results are displayed) Labs Reviewed  COMPREHENSIVE METABOLIC PANEL - Abnormal; Notable for the following components:      Result Value   Potassium 2.3 (*)    Chloride 100 (*)    Glucose, Bld 106 (*)    All other components within normal limits  CBC WITH DIFFERENTIAL/PLATELET - Abnormal; Notable for the following components:   WBC 17.0 (*)    Neutro Abs 14.9 (*)    All other components within normal limits  LIPASE, BLOOD  URINALYSIS, ROUTINE W REFLEX MICROSCOPIC  POC URINE PREG, ED    EKG EKG Interpretation  Date/Time:  Monday March 25 2018 02:09:31 EDT Ventricular Rate:  95 PR Interval:    QRS Duration: 112 QT Interval:  459 QTC Calculation: 578 R Axis:   51 Text Interpretation:  Sinus rhythm Incomplete right bundle branch block Low voltage, precordial leads Abnormal inferior Q waves  Prolonged QT interval Confirmed by Ripley Fraise [id number]) on 03/25/2018 2:20:13 AM   Radiology Ct Abdomen Pelvis W Contrast  Result Date: 03/25/2018 CLINICAL DATA:  Abdominal pain radiating to back, severe abdominal cramping after eating salad this afternoon. History of reflux disease, cholecystectomy, back surgery. EXAM: CT ABDOMEN AND PELVIS WITH CONTRAST TECHNIQUE: Multidetector CT imaging of the abdomen and pelvis was performed using the standard protocol following bolus administration of intravenous contrast. CONTRAST:  146mL ISOVUE-300 IOPAMIDOL (ISOVUE-300) INJECTION 61% COMPARISON:  None. FINDINGS: LOWER CHEST: Lung bases are clear. Included heart size is normal. No pericardial effusion. HEPATOBILIARY: Status post cholecystectomy.  Normal liver. PANCREAS: Normal. SPLEEN: Normal. ADRENALS/URINARY TRACT: Kidneys are orthotopic, demonstrating symmetric enhancement. No nephrolithiasis, hydronephrosis or solid renal masses. The unopacified ureters are normal in course and caliber. Urinary bladder is partially distended and unremarkable. Normal adrenal glands. STOMACH/BOWEL: Single loop of small bowel wall thickening with associated fat stranding and mesenteric edema. Small hiatal hernia. The stomach, large bowel are normal in course and caliber without inflammatory changes. Mild colonic diverticulosis. Mild amount of retained large bowel stool. Appendix tip abuts the inflamed small bowel and is 9 mm in transaxial dimension (series 5, image 57. VASCULAR/LYMPHATIC: Aortoiliac vessels are normal in course and caliber. No lymphadenopathy by CT size criteria. REPRODUCTIVE: Normal. OTHER: No intraperitoneal free fluid or free air. MUSCULOSKELETAL: Nonacute. Severe L4-5 disc height loss with vacuum disc and endplate spurring compatible with degenerative disc resulting in mild LEFT neural foraminal narrowing. IMPRESSION: 1. Focal small bowel enteritis may be infectious or inflammatory. Appendiceal tip abuts the loop  of small bowel and tip appendicitis is also possible. 2. Acute findings discussed with and reconfirmed by Robbie Louis on 03/25/2018 at 3:36 am. Electronically Signed   By: Elon Alas M.D.   On: 03/25/2018 03:37    Procedures Procedures   Medications Ordered in ED Medications  potassium chloride 10 mEq in 100 mL IVPB (10 mEq Intravenous New Bag/Given 03/25/18 0410)  piperacillin-tazobactam (ZOSYN) IVPB 3.375 g (3.375 g Intravenous New Bag/Given 03/25/18 0409)  fentaNYL (SUBLIMAZE) injection 50 mcg (has no administration in time range)  fentaNYL (SUBLIMAZE) injection 100 mcg (100 mcg Intravenous Given 03/25/18 0230)  ondansetron (ZOFRAN) injection 4 mg (4 mg Intravenous Given 03/25/18 0230)  magnesium sulfate IVPB 2 g 50 mL (0 g Intravenous Stopped 03/25/18 0352)  potassium chloride 10 mEq in 100 mL IVPB (0 mEq Intravenous Stopped 03/25/18 0352)  iopamidol (ISOVUE-300) 61 % injection 100 mL (100 mLs Intravenous Contrast Given 03/25/18 0256)  fentaNYL (SUBLIMAZE) injection 50 mcg (50 mcg Intravenous Given 03/25/18 0321)     Initial Impression / Assessment and Plan / ED Course  I have reviewed the triage vital signs and the nursing notes.  Pertinent labs results that were available during my care of the patient were reviewed by me and considered in my medical decision making (see chart for details).     1:59 AM Patient with focal right lower quadrant tenderness.  She has an elevated white count.  Will need to get CT imaging.  Of note, she also has hypokalemia, EKG ordered, will need to have IV magnesium and potassium 4:12 AM Ct results d/w radiology Discussed CT findings with dr Arnoldo Morale Plan to admit to medicine, start zosyn and keep NPO Pt updated on plan D/w dr Darrick Meigs for admission Will give another dose of IV KCL due to significant HYPOkalemia  Final Clinical Impressions(s) / ED Diagnoses   Final diagnoses:  Enteritis  Hypokalemia  Prolonged Q-T interval on ECG    ED Discharge  Orders    None       Ripley Fraise, MD 03/25/18 [phone]

## 2018-03-25 NOTE — Progress Notes (Signed)
**Note De-identified  Obfuscation** EKG complete; placed in patient chart 

## 2018-03-25 NOTE — Progress Notes (Signed)
Pharmacy Antibiotic Note  [name] is a 37 y.o. female admitted on 03/24/2018 with intra-abdominal infection.  Pharmacy has been consulted for Zosyn dosing.  Plan: Zosyn 3.375g IV q8h (4 hour infusion).  Monitor labs, c/s, and patient improvement  Height: 5\' 8"  (172.7 cm) Weight: 250 lb (113.4 kg) IBW/kg (Calculated) : 63.9  Temp (24hrs), Avg:98.2 F (36.8 C), Min:97.4 F (36.3 C), Max:99 F (37.2 C)  Recent Labs  Lab 03/25/18 0039 03/25/18 0700  WBC 17.0* 11.6*  CREATININE 0.58 0.59    Estimated Creatinine Clearance: 126 mL/min (by C-G formula based on SCr of 0.59 mg/dL).    Allergies  Allergen Reactions  . Diclofenac Anaphylaxis  . Propoxyphene N-Acetaminophen Rash    Antimicrobials this admission: Zosyn 6/3 >>      Dose adjustments this admission: N/A  Microbiology results: None pending  Thank you for allowing pharmacy to be a part of this patient's care.  Ramond Craver 03/25/2018 8:29 AM

## 2018-03-26 ENCOUNTER — Inpatient Hospital Stay (HOSPITAL_COMMUNITY): Payer: Medicare Other | Admitting: Anesthesiology

## 2018-03-26 ENCOUNTER — Encounter (HOSPITAL_COMMUNITY)
Admission: EM | Disposition: A | Discharge status: Home / Self Care | Payer: Self-pay | Source: Home / Self Care | Attending: General Surgery

## 2018-03-26 ENCOUNTER — Encounter (HOSPITAL_COMMUNITY): Payer: Self-pay | Admitting: Anesthesiology

## 2018-03-26 DIAGNOSIS — E876 Hypokalemia: Secondary | ICD-10-CM | POA: Diagnosis not present

## 2018-03-26 DIAGNOSIS — Z9049 Acquired absence of other specified parts of digestive tract: Secondary | ICD-10-CM | POA: Diagnosis not present

## 2018-03-26 DIAGNOSIS — K3580 Unspecified acute appendicitis: Secondary | ICD-10-CM | POA: Diagnosis not present

## 2018-03-26 DIAGNOSIS — K219 Gastro-esophageal reflux disease without esophagitis: Secondary | ICD-10-CM | POA: Diagnosis not present

## 2018-03-26 DIAGNOSIS — Z820 Family history of epilepsy and other diseases of the nervous system: Secondary | ICD-10-CM | POA: Diagnosis not present

## 2018-03-26 DIAGNOSIS — R1031 Right lower quadrant pain: Secondary | ICD-10-CM | POA: Diagnosis not present

## 2018-03-26 DIAGNOSIS — Z8249 Family history of ischemic heart disease and other diseases of the circulatory system: Secondary | ICD-10-CM | POA: Diagnosis not present

## 2018-03-26 DIAGNOSIS — K529 Noninfective gastroenteritis and colitis, unspecified: Secondary | ICD-10-CM | POA: Diagnosis not present

## 2018-03-26 DIAGNOSIS — Z833 Family history of diabetes mellitus: Secondary | ICD-10-CM | POA: Diagnosis not present

## 2018-03-26 DIAGNOSIS — G35 Multiple sclerosis: Secondary | ICD-10-CM | POA: Diagnosis not present

## 2018-03-26 DIAGNOSIS — I081 Rheumatic disorders of both mitral and tricuspid valves: Secondary | ICD-10-CM | POA: Diagnosis not present

## 2018-03-26 HISTORY — PX: LAPAROSCOPIC APPENDECTOMY: SHX408

## 2018-03-26 LAB — CBC
HCT: 36.8 % (ref 36.0–46.0)
HEMOGLOBIN: 12.3 g/dL (ref 12.0–15.0)
MCH: 27.1 pg (ref 26.0–34.0)
MCHC: 33.4 g/dL (ref 30.0–36.0)
MCV: 81.1 fL (ref 78.0–100.0)
PLATELETS: 220 10*3/uL (ref 150–400)
RBC: 4.54 MIL/uL (ref 3.87–5.11)
RDW: 14.4 % (ref 11.5–15.5)
WBC: 10.4 10*3/uL (ref 4.0–10.5)

## 2018-03-26 LAB — BASIC METABOLIC PANEL
Anion gap: 6 (ref 5–15)
BUN: 6 mg/dL (ref 6–20)
CALCIUM: 8.4 mg/dL — AB (ref 8.9–10.3)
CHLORIDE: 102 mmol/L (ref 101–111)
CO2: 30 mmol/L (ref 22–32)
CREATININE: 0.56 mg/dL (ref 0.44–1.00)
GFR calc Af Amer: >60 mL/min (ref >60)
Glucose, Bld: 100 mg/dL — ABNORMAL HIGH (ref 65–99)
Potassium: 3.4 mmol/L — ABNORMAL LOW (ref 3.5–5.1)
SODIUM: 138 mmol/L (ref 135–145)

## 2018-03-26 LAB — HIV ANTIBODY (ROUTINE TESTING W REFLEX): HIV SCREEN 4TH GENERATION: NONREACTIVE

## 2018-03-26 LAB — SURGICAL PCR SCREEN
MRSA, PCR: NEGATIVE
Staphylococcus aureus: NEGATIVE

## 2018-03-26 SURGERY — APPENDECTOMY, LAPAROSCOPIC
Anesthesia: General | Site: Abdomen

## 2018-03-26 MED ORDER — CHLORHEXIDINE GLUCONATE CLOTH 2 % EX PADS
6.0000 | MEDICATED_PAD | Freq: Once | CUTANEOUS | Status: AC
  Administered 2018-03-26: 6 via TOPICAL

## 2018-03-26 MED ORDER — OXYCODONE HCL 5 MG PO TABS
5.0000 mg | ORAL_TABLET | ORAL | Status: DC | PRN
  Administered 2018-03-26 – 2018-03-27 (×3): 5 mg via ORAL
  Filled 2018-03-26 (×3): qty 1

## 2018-03-26 MED ORDER — PIPERACILLIN-TAZOBACTAM 3.375 G IVPB
3.3750 g | Freq: Three times a day (TID) | INTRAVENOUS | Status: DC
  Administered 2018-03-26 – 2018-03-27 (×3): 3.375 g via INTRAVENOUS
  Filled 2018-03-26 (×3): qty 50

## 2018-03-26 MED ORDER — PROCHLORPERAZINE MALEATE 5 MG PO TABS: 10.0000 mg | ORAL_TABLET | Freq: Four times a day (QID) | ORAL | Status: DC | PRN

## 2018-03-26 MED ORDER — MIDAZOLAM HCL 2 MG/2ML IJ SOLN
INTRAMUSCULAR | Status: AC
  Filled 2018-03-26: qty 2

## 2018-03-26 MED ORDER — ARTIFICIAL TEARS OPHTHALMIC OINT
TOPICAL_OINTMENT | OPHTHALMIC | Status: AC
  Filled 2018-03-26: qty 3.5

## 2018-03-26 MED ORDER — FENTANYL CITRATE (PF) 250 MCG/5ML IJ SOLN
INTRAMUSCULAR | Status: AC
  Filled 2018-03-26: qty 5

## 2018-03-26 MED ORDER — ONDANSETRON HCL 4 MG/2ML IJ SOLN
INTRAMUSCULAR | Status: AC
  Filled 2018-03-26: qty 2

## 2018-03-26 MED ORDER — PROMETHAZINE HCL 25 MG/ML IJ SOLN
12.5000 mg | Freq: Once | INTRAMUSCULAR | Status: AC
  Administered 2018-03-26: 12.5 mg via INTRAVENOUS

## 2018-03-26 MED ORDER — PROPOFOL 10 MG/ML IV BOLUS
INTRAVENOUS | Status: AC
  Filled 2018-03-26: qty 20

## 2018-03-26 MED ORDER — BUPIVACAINE LIPOSOME 1.3 % IJ SUSP
INTRAMUSCULAR | Status: DC | PRN
  Administered 2018-03-26: 20 mL

## 2018-03-26 MED ORDER — BUPIVACAINE HCL (PF) 0.5 % IJ SOLN
INTRAMUSCULAR | Status: AC
  Filled 2018-03-26: qty 30

## 2018-03-26 MED ORDER — POVIDONE-IODINE 10 % OINT PACKET
TOPICAL_OINTMENT | CUTANEOUS | Status: DC | PRN
  Administered 2018-03-26: 1 via TOPICAL

## 2018-03-26 MED ORDER — DEXAMETHASONE SODIUM PHOSPHATE 4 MG/ML IJ SOLN
INTRAMUSCULAR | Status: DC | PRN
  Administered 2018-03-26: 4 mg via INTRAVENOUS

## 2018-03-26 MED ORDER — PROCHLORPERAZINE EDISYLATE 10 MG/2ML IJ SOLN: 5.0000 mg | Freq: Four times a day (QID) | INTRAMUSCULAR | Status: DC | PRN

## 2018-03-26 MED ORDER — DEXAMETHASONE SODIUM PHOSPHATE 4 MG/ML IJ SOLN
INTRAMUSCULAR | Status: AC
  Filled 2018-03-26: qty 1

## 2018-03-26 MED ORDER — BUPIVACAINE LIPOSOME 1.3 % IJ SUSP
INTRAMUSCULAR | Status: AC
  Filled 2018-03-26: qty 20

## 2018-03-26 MED ORDER — SUCCINYLCHOLINE CHLORIDE 20 MG/ML IJ SOLN
INTRAMUSCULAR | Status: DC | PRN
  Administered 2018-03-26: 140 mg via INTRAVENOUS

## 2018-03-26 MED ORDER — ENOXAPARIN SODIUM 40 MG/0.4ML ~~LOC~~ SOLN
40.0000 mg | SUBCUTANEOUS | Status: DC
  Administered 2018-03-27: 40 mg via SUBCUTANEOUS
  Filled 2018-03-26: qty 0.4

## 2018-03-26 MED ORDER — ROCURONIUM BROMIDE 100 MG/10ML IV SOLN
INTRAVENOUS | Status: DC | PRN
  Administered 2018-03-26: 20 mg via INTRAVENOUS
  Administered 2018-03-26: 10 mg via INTRAVENOUS

## 2018-03-26 MED ORDER — PROPOFOL 10 MG/ML IV BOLUS
INTRAVENOUS | Status: DC | PRN
  Administered 2018-03-26: 150 mg via INTRAVENOUS

## 2018-03-26 MED ORDER — POVIDONE-IODINE 10 % EX OINT
TOPICAL_OINTMENT | CUTANEOUS | Status: AC
  Filled 2018-03-26: qty 1

## 2018-03-26 MED ORDER — LACTATED RINGERS IV SOLN
INTRAVENOUS | Status: DC
  Administered 2018-03-26: 17:00:00 via INTRAVENOUS

## 2018-03-26 MED ORDER — HYDROMORPHONE HCL 1 MG/ML IJ SOLN: 0.2500 mg | INTRAMUSCULAR | Status: DC | PRN

## 2018-03-26 MED ORDER — HYDROCODONE-ACETAMINOPHEN 7.5-325 MG PO TABS: 1.0000 | ORAL_TABLET | Freq: Once | ORAL | Status: DC | PRN

## 2018-03-26 MED ORDER — LORAZEPAM 2 MG/ML IJ SOLN: 1.0000 mg | INTRAMUSCULAR | Status: DC | PRN

## 2018-03-26 MED ORDER — SEVOFLURANE IN SOLN
RESPIRATORY_TRACT | Status: AC
  Filled 2018-03-26: qty 250

## 2018-03-26 MED ORDER — ACETAMINOPHEN 500 MG PO TABS
1000.0000 mg | ORAL_TABLET | Freq: Four times a day (QID) | ORAL | Status: DC
  Administered 2018-03-26 – 2018-03-27 (×3): 1000 mg via ORAL
  Filled 2018-03-26 (×3): qty 2

## 2018-03-26 MED ORDER — CHLORHEXIDINE GLUCONATE CLOTH 2 % EX PADS: 6.0000 | MEDICATED_PAD | Freq: Once | CUTANEOUS | Status: DC

## 2018-03-26 MED ORDER — SUGAMMADEX SODIUM 200 MG/2ML IV SOLN
INTRAVENOUS | Status: AC
  Filled 2018-03-26: qty 2

## 2018-03-26 MED ORDER — SUGAMMADEX SODIUM 200 MG/2ML IV SOLN
INTRAVENOUS | Status: DC | PRN
  Administered 2018-03-26: 226.8 mg via INTRAVENOUS

## 2018-03-26 MED ORDER — PROMETHAZINE HCL 25 MG/ML IJ SOLN
INTRAMUSCULAR | Status: AC
  Filled 2018-03-26: qty 1

## 2018-03-26 MED ORDER — ONDANSETRON HCL 4 MG/2ML IJ SOLN
INTRAMUSCULAR | Status: DC | PRN
  Administered 2018-03-26: 4 mg via INTRAVENOUS

## 2018-03-26 MED ORDER — SUGAMMADEX SODIUM 500 MG/5ML IV SOLN
INTRAVENOUS | Status: AC
  Filled 2018-03-26: qty 5

## 2018-03-26 MED ORDER — HYDROMORPHONE HCL 1 MG/ML IJ SOLN
1.0000 mg | INTRAMUSCULAR | Status: DC | PRN
  Administered 2018-03-26 – 2018-03-27 (×3): 1 mg via INTRAVENOUS
  Filled 2018-03-26 (×4): qty 1

## 2018-03-26 MED ORDER — LIDOCAINE HCL (CARDIAC) PF 100 MG/5ML IV SOSY
PREFILLED_SYRINGE | INTRAVENOUS | Status: DC | PRN
  Administered 2018-03-26: 40 mg via INTRAVENOUS

## 2018-03-26 MED ORDER — MEPERIDINE HCL 50 MG/ML IJ SOLN: 6.2500 mg | INTRAMUSCULAR | Status: DC | PRN

## 2018-03-26 MED ORDER — LACTATED RINGERS IV SOLN
INTRAVENOUS | Status: DC | PRN
  Administered 2018-03-26: 12:00:00 via INTRAVENOUS

## 2018-03-26 MED ORDER — MIDAZOLAM HCL 5 MG/5ML IJ SOLN
INTRAMUSCULAR | Status: DC | PRN
  Administered 2018-03-26: 2 mg via INTRAVENOUS

## 2018-03-26 MED ORDER — 0.9 % SODIUM CHLORIDE (POUR BTL) OPTIME
TOPICAL | Status: DC | PRN
  Administered 2018-03-26: 1000 mL

## 2018-03-26 SURGICAL SUPPLY — 50 items
BAG RETRIEVAL 10 (BASKET) ×1
BAG RETRIEVAL 10MM (BASKET) ×1
CHLORAPREP W/TINT 26ML (MISCELLANEOUS) ×3 IMPLANT
CLOTH BEACON ORANGE TIMEOUT ST (SAFETY) ×3 IMPLANT
COVER LIGHT HANDLE STERIS (MISCELLANEOUS) ×6 IMPLANT
CUTTER FLEX LINEAR 45M (STAPLE) ×3 IMPLANT
DECANTER SPIKE VIAL GLASS SM (MISCELLANEOUS) ×3 IMPLANT
ELECT REM PT RETURN 9FT ADLT (ELECTROSURGICAL) ×3
ELECTRODE REM PT RTRN 9FT ADLT (ELECTROSURGICAL) ×1 IMPLANT
EVACUATOR SMOKE 8.L (FILTER) ×3 IMPLANT
FORMALIN 10 PREFIL 120ML (MISCELLANEOUS) ×3 IMPLANT
GLOVE BIOGEL PI IND STRL 7.0 (GLOVE) ×2 IMPLANT
GLOVE BIOGEL PI INDICATOR 7.0 (GLOVE) ×4
GLOVE SURG SS PI 7.5 STRL IVOR (GLOVE) ×3 IMPLANT
GOWN STRL REUS W/ TWL XL LVL3 (GOWN DISPOSABLE) ×1 IMPLANT
GOWN STRL REUS W/TWL LRG LVL3 (GOWN DISPOSABLE) ×3 IMPLANT
GOWN STRL REUS W/TWL XL LVL3 (GOWN DISPOSABLE) ×3
INST SET LAPROSCOPIC AP (KITS) ×3 IMPLANT
KIT TURNOVER KIT A (KITS) ×3 IMPLANT
MANIFOLD NEPTUNE II (INSTRUMENTS) ×3 IMPLANT
NDL HYPO 18GX1.5 BLUNT FILL (NEEDLE) ×1 IMPLANT
NDL HYPO 25X1 1.5 SAFETY (NEEDLE) ×1 IMPLANT
NDL INSUFFLATION 14GA 120MM (NEEDLE) ×1 IMPLANT
NEEDLE HYPO 18GX1.5 BLUNT FILL (NEEDLE) ×3 IMPLANT
NEEDLE HYPO 25X1 1.5 SAFETY (NEEDLE) ×3 IMPLANT
NEEDLE INSUFFLATION 14GA 120MM (NEEDLE) ×3 IMPLANT
NS IRRIG 1000ML POUR BTL (IV SOLUTION) ×3 IMPLANT
PACK LAP CHOLE LZT030E (CUSTOM PROCEDURE TRAY) ×3 IMPLANT
PAD ARMBOARD 7.5X6 YLW CONV (MISCELLANEOUS) ×3 IMPLANT
PENCIL HANDSWITCHING (ELECTRODE) ×3 IMPLANT
RELOAD 45 VASCULAR/THIN (ENDOMECHANICALS) ×3 IMPLANT
RELOAD STAPLE 45 2.5 WHT GRN (ENDOMECHANICALS) IMPLANT
SET BASIN LINEN APH (SET/KITS/TRAYS/PACK) ×3 IMPLANT
SHEARS HARMONIC ACE PLUS 36CM (ENDOMECHANICALS) ×3 IMPLANT
SPONGE GAUZE 2X2 8PLY STER LF (GAUZE/BANDAGES/DRESSINGS) ×3
SPONGE GAUZE 2X2 8PLY STRL LF (GAUZE/BANDAGES/DRESSINGS) ×6 IMPLANT
STAPLER VISISTAT (STAPLE) ×3 IMPLANT
SUT VICRYL 0 UR6 27IN ABS (SUTURE) ×3 IMPLANT
SYR 20CC LL (SYRINGE) ×6 IMPLANT
SYS BAG RETRIEVAL 10MM (BASKET) ×1
SYSTEM BAG RETRIEVAL 10MM (BASKET) ×1 IMPLANT
TAPE CLOTH SURG 4X10 WHT LF (GAUZE/BANDAGES/DRESSINGS) ×2 IMPLANT
TRAY FOLEY W/BAG SLVR 16FR (SET/KITS/TRAYS/PACK) ×3
TRAY FOLEY W/BAG SLVR 16FR ST (SET/KITS/TRAYS/PACK) IMPLANT
TROCAR ENDO BLADELESS 11MM (ENDOMECHANICALS) ×3 IMPLANT
TROCAR ENDO BLADELESS 12MM (ENDOMECHANICALS) ×3 IMPLANT
TROCAR XCEL NON-BLD 5MMX100MML (ENDOMECHANICALS) ×3 IMPLANT
TUBING INSUFFLATION (TUBING) ×3 IMPLANT
WARMER LAPAROSCOPE (MISCELLANEOUS) ×3 IMPLANT
YANKAUER SUCT 12FT TUBE ARGYLE (SUCTIONS) ×3 IMPLANT

## 2018-03-26 NOTE — Progress Notes (Signed)
**Note De-identified  Obfuscation** EKG complete RN notified  and placed in patient chart.  

## 2018-03-26 NOTE — Progress Notes (Signed)
Per pharmacy it is okay to administer Zosyn and Lactated Ringer.

## 2018-03-26 NOTE — Anesthesia Preprocedure Evaluation (Addendum)
Anesthesia Evaluation  Patient identified by MRN, date of birth, ID band Patient awake    Reviewed: Allergy & Precautions, H&P , NPO status , Patient's Chart, lab work & pertinent test results  Airway Mallampati: II  TM Distance: >3 FB Neck ROM: full  Mouth opening: Limited Mouth Opening Comment: Small mouth opening TMJ clicking/popping, right side H/O jaw pain secondary to her TMJ Dental no notable dental hx.    Pulmonary neg pulmonary ROS, former smoker,    Pulmonary exam normal breath sounds clear to auscultation       Cardiovascular Exercise Tolerance: Good negative cardio ROS  + Valvular Problems/Murmurs MR  Rhythm:regular Rate:Normal  cardiac valve. echo 2013 for first eval GILENYA,mod MR,TR   Neuro/Psych PSYCHIATRIC DISORDERS Anxiety Depression Multiple sclerosis ([place])  Neuromuscular disease negative neurological ROS  negative psych ROS   GI/Hepatic negative GI ROS, Neg liver ROS, GERD  ,  Endo/Other  negative endocrine ROS  Renal/GU negative Renal ROS  negative genitourinary   Musculoskeletal   Abdominal   Peds  Hematology negative hematology ROS (+) anemia ,   Anesthesia Other Findings cardiac valve. echo 2013 for first eval GILENYA,mod MR,TR  Reproductive/Obstetrics negative OB ROS                           Anesthesia Physical Anesthesia Plan  ASA: II  Anesthesia Plan: General   Post-op Pain Management:    Induction:   PONV Risk Score and Plan:   Airway Management Planned:   Additional Equipment:   Intra-op Plan:   Post-operative Plan:   Informed Consent: I have reviewed the patients History and Physical, chart, labs and discussed the procedure including the risks, benefits and alternatives for the proposed anesthesia with the patient or authorized representative who has indicated his/her understanding and acceptance.   Dental Advisory Given  Plan Discussed  with: CRNA  Anesthesia Plan Comments:         Anesthesia Quick Evaluation

## 2018-03-26 NOTE — Transfer of Care (Signed)
Immediate Anesthesia Transfer of Care Note  Patient: [name]  Procedure(s) Performed: APPENDECTOMY LAPAROSCOPIC (N/A Abdomen)  Patient Location: PACU  Anesthesia Type:General  Level of Consciousness: awake, alert , oriented and patient cooperative  Airway & Oxygen Therapy: Patient Spontanous Breathing  Post-op Assessment: Report given to RN and Post -op Vital signs reviewed and stable  Post vital signs: Reviewed and stable  Last Vitals:  Vitals Value Taken Time  BP    Temp    Pulse 107 03/26/2018  1:32 PM  Resp 19 03/26/2018  1:32 PM  SpO2 96 % 03/26/2018  1:32 PM  Vitals shown include unvalidated device data.  Last Pain:  Vitals:   03/26/18 1146  TempSrc: Oral  PainSc: 7       Patients Stated Pain Goal: 7 (91/50/41 3643)  Complications: No apparent anesthesia complications

## 2018-03-26 NOTE — Op Note (Signed)
Patient:  [name]  DOB:  1981-04-05  MRN:  [id number]   Preop Diagnosis: Acute appendicitis  Postop Diagnosis: Same  Procedure: Laparoscopic appendectomy  Surgeon: Aviva Signs, MD  Anes: General endotracheal  Indications: Patient is a 37 year old white female who presented to the emergency room with right lower quadrant abdominal pain and history of diarrhea.  CT scan of the abdomen revealed possible distal acute appendicitis versus enteritis.  Due to ongoing pain over the past 24 hours, the patient now comes to the operating room for laparoscopic appendectomy.  The risks and benefits of the procedure including bleeding, infection, the possibility of an open procedure, and recurrence of the pain were fully explained to the patient, who gave informed consent.  Procedure note: The patient was placed in supine position.  After induction of general endotracheal anesthesia, the abdomen was prepped and draped using the usual sterile technique with DuraPrep.  Surgical site confirmation was performed.  A supraumbilical incision was made down to the fascia.  A Veress needle was introduced into the abdominal cavity and confirmation of placement was done using saline drop test.  The abdomen was then insufflated to 16 mmHg pressure.  An 11 mm trocar was introduced into the abdominal cavity under direct visualization without difficulty.  The patient was placed in deeper Trendelenburg position and a 12 mm trocar was placed in the suprapubic region and a 5 mm trocar was placed in the left lower quadrant region.  The appendix was visualized and noted to be significantly elongated.  The juncture of the appendix to the cecum was fully visualized.  In a retrograde fashion, the mesentery was divided using the harmonic scalpel from the cecum to the tip of the appendix which was inflamed with adipose tissue surrounding it.  A vascular Endo GIA was placed across the base the appendix and fired.  The appendix  was then removed using an Endo Catch bag without difficulty.  The staple line was inspected and noted to be within normal limits.  The surrounding bowel appeared to be within normal limits.  All fluid and air were then evacuated from the abdominal cavity prior to the removal of the trochars.  All wounds were irrigated with normal saline.  All wounds were injected with Exparel.  The supraumbilical fascia was reapproximated using an 0 Vicryl interrupted suture.  All skin incisions were closed using staples.  Betadine ointment and dry sterile dressings were applied.  All tape and needle counts were correct at the end of the procedure.  The patient was extubated in the operating room and transferred to PACU in stable condition.  Complications: None  EBL: Minimal  Specimen: Appendix

## 2018-03-26 NOTE — Anesthesia Procedure Notes (Signed)
Procedure Name: Intubation Date/Time: 03/26/2018 12:33 PM Performed by: Andree Elk, Amy A, CRNA Pre-anesthesia Checklist: Patient identified, Emergency Drugs available, Timeout performed, Suction available and Patient being monitored Patient Re-evaluated:Patient Re-evaluated prior to induction Oxygen Delivery Method: Circle system utilized Preoxygenation: Pre-oxygenation with 100% oxygen Induction Type: IV induction, Cricoid Pressure applied and Rapid sequence Laryngoscope Size: Glidescope and 3 Grade View: Grade I Tube type: Oral Number of attempts: 1 Airway Equipment and Method: Stylet and Video-laryngoscopy Placement Confirmation: ETT inserted through vocal cords under direct vision,  positive ETCO2 and breath sounds checked- equal and bilateral Secured at: 22 cm Tube secured with: Tape Dental Injury: Teeth and Oropharynx as per pre-operative assessment

## 2018-03-26 NOTE — Progress Notes (Signed)
Subjective: Patient still with right lower quadrant abdominal pain.  Unchanged from yesterday.  Objective: Vital signs in last 24 hours: Temp:  [98.2 F (36.8 C)-98.4 F (36.9 C)] 98.3 F (36.8 C) (06/04 0630) Pulse Rate:  [71-87] 85 (06/04 0630) Resp:  [18-19] 18 (06/03 2316) BP: (95-115)/(57-76) 115/76 (06/04 0630) SpO2:  [96 %-100 %] 96 % (06/04 0630) Last BM Date: 03/20/18  Intake/Output from previous day: 06/03 0701 - 06/04 0700 In: 3090 [P.O.:840; I.V.:2150; IV Piggyback:100] Out: -  Intake/Output this shift: No intake/output data recorded.  General appearance: alert, cooperative and no distress GI: Soft with tenderness noted in the right lower quadrant.  No rigidity noted.  Lab Results:  Recent Labs    03/25/18 0700 03/26/18 0542  WBC 11.6* 10.4  HGB 12.3 12.3  HCT 36.6 36.8  PLT 225 220   BMET Recent Labs    03/25/18 0700 03/26/18 0542  NA 139 138  K 3.0* 3.4*  CL 102 102  CO2 30 30  GLUCOSE 119* 100*  BUN 12 6  CREATININE 0.59 0.56  CALCIUM 8.8* 8.4*   PT/INR No results for input(s): LABPROT, INR in the last 72 hours.  Studies/Results: Ct Abdomen Pelvis W Contrast  Result Date: 03/25/2018 CLINICAL DATA:  Abdominal pain radiating to back, severe abdominal cramping after eating salad this afternoon. History of reflux disease, cholecystectomy, back surgery. EXAM: CT ABDOMEN AND PELVIS WITH CONTRAST TECHNIQUE: Multidetector CT imaging of the abdomen and pelvis was performed using the standard protocol following bolus administration of intravenous contrast. CONTRAST:  145mL ISOVUE-300 IOPAMIDOL (ISOVUE-300) INJECTION 61% COMPARISON:  None. FINDINGS: LOWER CHEST: Lung bases are clear. Included heart size is normal. No pericardial effusion. HEPATOBILIARY: Status post cholecystectomy.  Normal liver. PANCREAS: Normal. SPLEEN: Normal. ADRENALS/URINARY TRACT: Kidneys are orthotopic, demonstrating symmetric enhancement. No nephrolithiasis, hydronephrosis or  solid renal masses. The unopacified ureters are normal in course and caliber. Urinary bladder is partially distended and unremarkable. Normal adrenal glands. STOMACH/BOWEL: Single loop of small bowel wall thickening with associated fat stranding and mesenteric edema. Small hiatal hernia. The stomach, large bowel are normal in course and caliber without inflammatory changes. Mild colonic diverticulosis. Mild amount of retained large bowel stool. Appendix tip abuts the inflamed small bowel and is 9 mm in transaxial dimension (series 5, image 57. VASCULAR/LYMPHATIC: Aortoiliac vessels are normal in course and caliber. No lymphadenopathy by CT size criteria. REPRODUCTIVE: Normal. OTHER: No intraperitoneal free fluid or free air. MUSCULOSKELETAL: Nonacute. Severe L4-5 disc height loss with vacuum disc and endplate spurring compatible with degenerative disc resulting in mild LEFT neural foraminal narrowing. IMPRESSION: 1. Focal small bowel enteritis may be infectious or inflammatory. Appendiceal tip abuts the loop of small bowel and tip appendicitis is also possible. 2. Acute findings discussed with and reconfirmed by Robbie Louis on 03/25/2018 at 3:36 am. Electronically Signed   By: Elon Alas M.D.   On: 03/25/2018 03:37    Anti-infectives: Anti-infectives (From admission, onward)   Start     Dose/Rate Route Frequency Ordered Stop   03/25/18 1200  piperacillin-tazobactam (ZOSYN) IVPB 3.375 g     3.375 g 12.5 mL/hr over 240 Minutes Intravenous Every 8 hours 03/25/18 0828     03/25/18 0400  piperacillin-tazobactam (ZOSYN) IVPB 3.375 g     3.375 g 100 mL/hr over 30 Minutes Intravenous  Once 03/25/18 0355 03/25/18 0441      Assessment/Plan: Impression: Continued right lower quadrant abdominal pain.  Hypokalemia resolved. Plan: We will take patient to the operating room  today for laparoscopic appendectomy.  The risks and benefits of the procedure including bleeding, infection, and the possibility  of an open procedure were fully explained to the patient, who gave informed consent.  LOS: 1 day    Aviva Signs 03/26/2018

## 2018-03-26 NOTE — Anesthesia Postprocedure Evaluation (Signed)
Anesthesia Post Note  Patient: [name]  Procedure(s) Performed: APPENDECTOMY LAPAROSCOPIC (N/A Abdomen)  Patient location during evaluation: PACU Anesthesia Type: General Level of consciousness: awake and alert, oriented and patient cooperative Pain management: pain level controlled Vital Signs Assessment: post-procedure vital signs reviewed and stable Respiratory status: spontaneous breathing Cardiovascular status: stable : Still having some nausea; patient has received Phenergan and second dose of Zofran in  PACU;  Anesthetic complications: no     Last Vitals:  Vitals:   03/26/18 1146 03/26/18 1330  BP: 123/77 118/66  Pulse:    Resp: 18   Temp: 36.6 C 36.7 C  SpO2: 98%     Last Pain:  Vitals:   03/26/18 1330  TempSrc:   PainSc: 0-No pain                 ADAMS, AMY A

## 2018-03-27 ENCOUNTER — Encounter (HOSPITAL_COMMUNITY): Payer: Self-pay | Admitting: General Surgery

## 2018-03-27 DIAGNOSIS — Z820 Family history of epilepsy and other diseases of the nervous system: Secondary | ICD-10-CM | POA: Diagnosis not present

## 2018-03-27 DIAGNOSIS — Z9049 Acquired absence of other specified parts of digestive tract: Secondary | ICD-10-CM | POA: Diagnosis not present

## 2018-03-27 DIAGNOSIS — R1031 Right lower quadrant pain: Secondary | ICD-10-CM | POA: Diagnosis not present

## 2018-03-27 DIAGNOSIS — E876 Hypokalemia: Secondary | ICD-10-CM | POA: Diagnosis not present

## 2018-03-27 DIAGNOSIS — Z833 Family history of diabetes mellitus: Secondary | ICD-10-CM | POA: Diagnosis not present

## 2018-03-27 DIAGNOSIS — Z8249 Family history of ischemic heart disease and other diseases of the circulatory system: Secondary | ICD-10-CM | POA: Diagnosis not present

## 2018-03-27 DIAGNOSIS — K3580 Unspecified acute appendicitis: Secondary | ICD-10-CM | POA: Diagnosis not present

## 2018-03-27 DIAGNOSIS — K219 Gastro-esophageal reflux disease without esophagitis: Secondary | ICD-10-CM | POA: Diagnosis not present

## 2018-03-27 DIAGNOSIS — G35 Multiple sclerosis: Secondary | ICD-10-CM | POA: Diagnosis not present

## 2018-03-27 DIAGNOSIS — I081 Rheumatic disorders of both mitral and tricuspid valves: Secondary | ICD-10-CM | POA: Diagnosis not present

## 2018-03-27 LAB — BASIC METABOLIC PANEL
Anion gap: 6 (ref 5–15)
BUN: 9 mg/dL (ref 6–20)
CHLORIDE: 103 mmol/L (ref 101–111)
CO2: 30 mmol/L (ref 22–32)
CREATININE: 0.61 mg/dL (ref 0.44–1.00)
Calcium: 8.6 mg/dL — ABNORMAL LOW (ref 8.9–10.3)
GFR calc Af Amer: >60 mL/min (ref >60)
GFR calc non Af Amer: >60 mL/min (ref >60)
GLUCOSE: 125 mg/dL — AB (ref 65–99)
Potassium: 3.5 mmol/L (ref 3.5–5.1)
Sodium: 139 mmol/L (ref 135–145)

## 2018-03-27 LAB — CBC
HEMATOCRIT: 35.6 % — AB (ref 36.0–46.0)
Hemoglobin: 11.8 g/dL — ABNORMAL LOW (ref 12.0–15.0)
MCH: 26.8 pg (ref 26.0–34.0)
MCHC: 33.1 g/dL (ref 30.0–36.0)
MCV: 80.9 fL (ref 78.0–100.0)
Platelets: 247 10*3/uL (ref 150–400)
RBC: 4.4 MIL/uL (ref 3.87–5.11)
RDW: 14.1 % (ref 11.5–15.5)
WBC: 12.7 10*3/uL — ABNORMAL HIGH (ref 4.0–10.5)

## 2018-03-27 MED ORDER — OXYCODONE HCL 5 MG PO TABS: 5.0000 mg | ORAL_TABLET | ORAL | 0 refills | Status: DC | PRN

## 2018-03-27 NOTE — Progress Notes (Signed)
Discharge instructions reviewed with patient. Given copy of AVS. Prescription sent to pharmacy by MD, pt aware to pick up. Discussed home medications, post-op education, when to seek medical attention and follow-up appointment as scheduled. Verbalized understanding. IV site removed by nursing student prior to discharge, site within normal limits. Patient left floor in stable condition via w/c accompanied by nurse tech. Donavan Foil, RN

## 2018-03-27 NOTE — Discharge Summary (Signed)
Physician Discharge Summary  Patient ID: [name] MRN: [id number] DOB/AGE: January 04, 1981 37 y.o.  Admit date: 03/24/2018 Discharge date: 03/27/2018  Admission Diagnoses: Right lower quadrant abdominal pain  Discharge Diagnoses: Same, appendicitis Active Problems:   Multiple sclerosis ([place])   Enteritis   Acute appendicitis Hypokalemia  Discharged Condition: good  Hospital Course: Patient is a 37 year old white female who presents the emergency room with a 1 week history of diarrhea and worsening right lower quadrant abdominal pain.  CT scan was performed which was equivocal for enteritis versus distal tip appendicitis.  Patient was admitted in the hospital for observation.  She was started on IV antibiotics and pain control.  She was also severely hypokalemic and this had to be addressed.  The following day, her pain had not improved, but she was taken to the operating room on 03/26/2018.  She underwent laparoscopic appendectomy.  The distal half of the appendix was noted to be inflamed.  No evidence of perforation was noted.  She tolerated the procedure well.  Her postoperative course has been unremarkable.  Her diet was advanced without difficulty.  Patient is being discharged home on 03/27/2018 in good and improving condition.  Treatments: surgery: Laparoscopic appendectomy on 03/26/2018  Discharge Exam: Blood pressure 104/62, pulse (!) 58, temperature 97.7 F (36.5 C), temperature source Oral, resp. rate 16, height 5\' 8"  (1.727 m), weight 250 lb (113.4 kg), SpO2 96 %. General appearance: alert, cooperative and no distress Resp: clear to auscultation bilaterally Cardio: regular rate and rhythm, S1, S2 normal, no murmur, click, rub or gallop GI: Soft, incisions healing well.  Disposition: Discharge disposition: 01-Home or Self Care       Discharge Instructions    Diet general   Complete by:  As directed    Increase activity slowly   Complete by:  As directed      Allergies as  of 03/27/2018      Reactions   Diclofenac Anaphylaxis   Zofran [ondansetron Hcl] Other (See Comments)   Prolonged QT - unable to have zofran per report   Propoxyphene N-acetaminophen Rash      Medication List    TAKE these medications   ALPRAZolam 1 MG tablet Commonly known as:  XANAX Take 0.5 tablets (0.5 mg total) by mouth 2 (two) times daily.   chlorthalidone 25 MG tablet Commonly known as:  HYGROTON 25 mg daily.   oxyCODONE 5 MG immediate release tablet Commonly known as:  Oxy IR/ROXICODONE Take 1 tablet (5 mg total) by mouth every 4 (four) hours as needed for moderate pain.   Peginterferon Beta-1a 125 MCG/0.5ML Sopn Commonly known as:  PLEGRIDY INJECT 125MCG INTO THE SKIN EVERY 14 DAYS   ranitidine 150 MG tablet Commonly known as:  ZANTAC Take 150 mg by mouth 2 (two) times daily.   vitamin C 500 MG tablet Commonly known as:  ASCORBIC ACID Take 1 tablet by mouth daily.      Follow-up Information    Aviva Signs, MD. Schedule an appointment as soon as possible for a visit on 04/02/2018.   Specialty:  General Surgery Contact information: [id number] [place] [address] [phone]           Signed: Aviva Signs 03/27/2018, 8:46 AM

## 2018-03-27 NOTE — Discharge Instructions (Signed)
Laparoscopic Appendectomy, Adult, Care After °Refer to this sheet in the next few weeks. These instructions provide you with information about caring for yourself after your procedure. Your health care provider may also give you more specific instructions. Your treatment has been planned according to current medical practices, but problems sometimes occur. Call your health care provider if you have any problems or questions after your procedure. °What can I expect after the procedure? °After the procedure, it is common to have: °· A decrease in your energy level. °· Mild pain in the area where the surgical cuts (incisions) were made. °· Constipation. This can be caused by pain medicine and a decrease in your activity. ° °Follow these instructions at home: °Medicines °· Take over-the-counter and prescription medicines only as told by your health care provider. °· Do not drive for 24 hours if you received a sedative. °· Do not drive or operate heavy machinery while taking prescription pain medicine. °· If you were prescribed an antibiotic medicine, take it as told by your health care provider. Do not stop taking the antibiotic even if you start to feel better. °Activity °· For 3 weeks or as long as told by your health care provider: °? Do not lift anything that is heavier than 10 pounds (4.5 kg). °? Do not play contact sports. °· Gradually return to your normal activities. Ask your health care provider what activities are safe for you. °Bathing °· Keep your incisions clean and dry. Clean them as often as told by your health care provider: °? Gently wash the incisions with soap and water. °? Rinse the incisions with water to remove all soap. °? Pat the incisions dry with a clean towel. Do not rub the incisions. °· You may take showers after 48 hours. °· Do not take baths, swim, or use hot tubs for 2 weeks or as told by your health care provider. °Incision care °· Follow instructions from your healthcare provider about  how to take care of your incisions. Make sure you: °? Wash your hands with soap and water before you change your bandage (dressing). If soap and water are not available, use hand sanitizer. °? Change your dressing as told by your health care provider. °? Leave stitches (sutures), skin glue, or adhesive strips in place. These skin closures may need to stay in place for 2 weeks or longer. If adhesive strip edges start to loosen and curl up, you may trim the loose edges. Do not remove adhesive strips completely unless your health care provider tells you to do that. °· Check your incision areas every day for signs of infection. Check for: °? More redness, swelling, or pain. °? More fluid or blood. °? Warmth. °? Pus or a bad smell. °Other Instructions °· If you were sent home with a drain, follow instructions from your health care provider about how to care for the drain and how to empty it. °· Take deep breaths. This helps to prevent your lungs from becoming inflamed. °· To relieve and prevent constipation: °? Drink plenty of fluids. °? Eat plenty of fruits and vegetables. °· Keep all follow-up visits as told by your health care provider. This is important. °Contact a health care provider if: °· You have more redness, swelling, or pain around an incision. °· You have more fluid or blood coming from an incision. °· Your incision feels warm to the touch. °· You have pus or a bad smell coming from an incision or dressing. °· Your incision   edges break open after your sutures have been removed. °· You have increasing pain in your shoulders. °· You feel dizzy or you faint. °· You develop shortness of breath. °· You keep feeling nauseous or vomiting. °· You have diarrhea or you cannot control your bowel functions. °· You lose your appetite. °· You develop swelling or pain in your legs. °Get help right away if: °· You have a fever. °· You develop a rash. °· You have difficulty breathing. °· You have sharp pains in your  chest. °This information is not intended to replace advice given to you by your health care provider. Make sure you discuss any questions you have with your health care provider. °Document Released: 10/09/2005 Document Revised: 03/10/2016 Document Reviewed: 03/29/2015 °Elsevier Interactive Patient Education © 2018 [employer] Inc[employer] ° °

## 2018-04-02 ENCOUNTER — Ambulatory Visit (INDEPENDENT_AMBULATORY_CARE_PROVIDER_SITE_OTHER): Payer: Self-pay | Admitting: General Surgery

## 2018-04-02 ENCOUNTER — Encounter: Payer: Self-pay | Admitting: General Surgery

## 2018-04-02 VITALS — BP 123/82 | HR 72 | Temp 97.3°F | Resp 20 | Wt 249.0 lb

## 2018-04-02 DIAGNOSIS — Z09 Encounter for follow-up examination after completed treatment for conditions other than malignant neoplasm: Secondary | ICD-10-CM

## 2018-04-02 NOTE — Progress Notes (Signed)
Subjective:     [name]  Status post laparoscopic appendectomy.  Doing well.  Has no complaints. Objective:    BP 123/82 (BP Location: Left Arm, Patient Position: Sitting, Cuff Size: Large)   Pulse 72   Temp (!) 97.3 F (36.3 C) (Temporal)   Resp 20   Wt 249 lb (112.9 kg)   BMI 37.86 kg/m   General:  alert, cooperative and no distress  Abdomen soft, incisions healing well.  Staples removed, Steri-Strips applied. Final pathology consistent with diagnosis.     Assessment:    Doing well postoperatively.    Plan:   Increase activity as able.  Follow-up here as needed.

## 2018-06-18 DIAGNOSIS — R609 Edema, unspecified: Secondary | ICD-10-CM | POA: Diagnosis not present

## 2018-06-18 DIAGNOSIS — E669 Obesity, unspecified: Secondary | ICD-10-CM | POA: Diagnosis not present

## 2018-06-18 DIAGNOSIS — K219 Gastro-esophageal reflux disease without esophagitis: Secondary | ICD-10-CM | POA: Diagnosis not present

## 2018-06-18 DIAGNOSIS — G35 Multiple sclerosis: Secondary | ICD-10-CM | POA: Diagnosis not present

## 2018-06-18 DIAGNOSIS — N319 Neuromuscular dysfunction of bladder, unspecified: Secondary | ICD-10-CM | POA: Diagnosis not present

## 2018-06-18 DIAGNOSIS — Z6837 Body mass index (BMI) 37.0-37.9, adult: Secondary | ICD-10-CM | POA: Diagnosis not present

## 2018-06-18 DIAGNOSIS — M545 Low back pain: Secondary | ICD-10-CM | POA: Diagnosis not present

## 2018-06-18 DIAGNOSIS — F419 Anxiety disorder, unspecified: Secondary | ICD-10-CM | POA: Diagnosis not present

## 2018-08-13 ENCOUNTER — Ambulatory Visit (INDEPENDENT_AMBULATORY_CARE_PROVIDER_SITE_OTHER): Payer: Medicare HMO | Admitting: Adult Health

## 2018-08-13 ENCOUNTER — Encounter: Payer: Self-pay | Admitting: Adult Health

## 2018-08-13 VITALS — BP 129/76 | HR 69 | Ht 68.0 in | Wt 244.6 lb

## 2018-08-13 DIAGNOSIS — G35 Multiple sclerosis: Secondary | ICD-10-CM

## 2018-08-13 NOTE — Progress Notes (Signed)
PATIENT: [name] DOB: Mar 27, 1981  REASON FOR VISIT: follow up HISTORY FROM: patient  HISTORY OF PRESENT ILLNESS: Today 08/13/18:  [name] is a 37 year old female with a history of multiple sclerosis.  She returns today for follow-up.  She is currently taking Plegridy and tolerating it well.  She denies any new numbness or weakness.  She continues to have numbness in the hands.  Denies any changes with her gait or balance.  No change with her vision.  She continues to see Dr. Zigmund Daniel with urology.  On occasion she does have to self catheterize- 1 time a month.  Her last MRI of the brain and cervical spine was done in 2018 and did not show any significant changes.  She returns today for evaluation.  HISTORY 02/05/18  [name] is a 37 year old female with a history of multiple sclerosis.  She returns today for follow-up.  She is currently on ampyra and plegridy.  She continues to tolerate these medications well.  She does not feel that the Ampyra patient has offered her much benefit.  She denies any new numbness or tingling. Has always had numbness in the hands.  Denies any weakness in extremities.  No change in bowel or bladder.  She continues to have to self catheterize on occasion.  She states that this does not occur daily.  She does see Dr. Zigmund Daniel with urology.  No change in her gait or balance.  Denies any changes with her vision.  No significant changes with her mood or behavior.  She returns today for evaluation.   REVIEW OF SYSTEMS: Out of a complete 14 system review of symptoms, the patient complains only of the following symptoms, and all other reviewed systems are negative.  See HPI  ALLERGIES: Allergies  Allergen Reactions  . Diclofenac Anaphylaxis  . Zofran [Ondansetron Hcl] Other (See Comments)    Prolonged QT - unable to have zofran per report  . Propoxyphene N-Acetaminophen Rash    HOME MEDICATIONS: Outpatient Medications Prior to Visit  Medication  Sig Dispense Refill  . ALPRAZolam (XANAX) 1 MG tablet Take 0.5 tablets (0.5 mg total) by mouth 2 (two) times daily. 30 tablet 5  . chlorthalidone (HYGROTON) 25 MG tablet 25 mg daily.     Marland Kitchen oxyCODONE (OXY IR/ROXICODONE) 5 MG immediate release tablet Take 1 tablet (5 mg total) by mouth every 4 (four) hours as needed for moderate pain. 40 tablet 0  . Peginterferon Beta-1a (PLEGRIDY) 125 MCG/0.5ML SOPN INJECT 125MCG INTO THE SKIN EVERY 14 DAYS 6 pen 2  . ranitidine (ZANTAC) 150 MG tablet Take 150 mg by mouth 2 (two) times daily.    . vitamin C (ASCORBIC ACID) 500 MG tablet Take 1 tablet by mouth daily.     No facility-administered medications prior to visit.     PAST MEDICAL HISTORY: Past Medical History:  Diagnosis Date  . Anxiety   . Depression   . Enlarged heart   . Fibroid 04/24/2016  . GERD (gastroesophageal reflux disease)   . Leaky heart valve   . Menorrhagia with irregular cycle 04/13/2016  . Multiple sclerosis ([place])    Dr Lonzo Cloud  . Multiple sclerosis exacerbation ([place])   . Numbness in right leg 02/13,12/12  . Obesity (BMI 35.0-39.9 without comorbidity)   . Other malaise and fatigue 10/20/2013  . Ovarian cyst 04/24/2016  . Regurgitation    cardiac valve. echo 2013 for first eval GILENYA,mod MR,TR    PAST SURGICAL HISTORY: Past Surgical History:  Procedure Laterality Date  . BACK SURGERY  2006  . CESAREAN SECTION  2000  . CHOLECYSTECTOMY  2010  . COLONOSCOPY  09/14/2010   Fields-hyperplastic polyps, int hemorrhoids  . CYST EXCISION  2006   pilonoidal  . DILITATION & CURRETTAGE/HYSTROSCOPY WITH NOVASURE ABLATION N/A 07/21/2016   Procedure: DILATATION & CURETTAGE/HYSTEROSCOPY WITH NOVASURE ABLATION;  Surgeon: Jonnie Kind, MD;  Location: [facility];  Service: Gynecology;  Laterality: N/A;  Novasure lenght - 5.0 width - 4.1 power - 113 time - 2 min 03 seconds  . ESOPHAGOGASTRODUODENOSCOPY  08/05/2008   Fields-incomplete Schatzki's ring, distal esophageal  erosions/esophagitis(mild), small HH, NEGATIVE h pylori  . LAPAROSCOPIC APPENDECTOMY N/A 03/26/2018   Procedure: APPENDECTOMY LAPAROSCOPIC;  Surgeon: Aviva Signs, MD;  Location: [facility];  Service: General;  Laterality: N/A;  . LAPAROSCOPIC BILATERAL SALPINGECTOMY Bilateral 07/21/2016   Procedure: LAPAROSCOPIC RIGHT SALPINGECTOMY AND LEFT OOPHECTOMY;  Surgeon: Jonnie Kind, MD;  Location: [facility];  Service: Gynecology;  Laterality: Bilateral;  . LUMBAR DISC SURGERY    . US ECHOCARDIOGRAPHY  03/19/2012   RV mildly dilated,RA mod. dilated,strial septum aneurysmal,Mod. MR & TR    FAMILY HISTORY: Family History  Problem Relation Age of Onset  . Other Mother        lost leg, has trouble swallowing  . Thyroid nodules Mother   . Heart attack Father   . Coronary artery disease Father   . Diabetes Father   . Rheum arthritis Father   . Colon cancer Paternal Uncle 48  . Colon cancer Paternal Grandmother 66  . Diabetes Paternal Grandmother   . Cancer Paternal Grandmother        breast,lung  . Cancer Maternal Grandmother 5       kind unknown  . Diabetes Maternal Grandmother   . Myasthenia gravis Maternal Grandfather   . Other Sister        tumor on ovary  . ADD / ADHD Son   . Heart attack Paternal Grandfather   . Multiple sclerosis Other        paternal great aunt    SOCIAL HISTORY: Social History   Socioeconomic History  . Marital status: Married    Spouse name: Divorced   . Number of children: 1  . Years of education: college  . Highest education level: Not on file  Occupational History  . Occupation: disabled  Social Needs  . Financial resource strain: Not on file  . Food insecurity:    Worry: Not on file    Inability: Not on file  . Transportation needs:    Medical: Not on file    Non-medical: Not on file  Tobacco Use  . Smoking status: Former Smoker    Packs/day: 1.00    Years: 20.00    Pack years: 20.00    Types: Cigarettes    Last attempt to quit: 02/15/2012     Years since quitting: 6.4  . Smokeless tobacco: Never Used  Substance and Sexual Activity  . Alcohol use: No    Alcohol/week: 0.0 standard drinks  . Drug use: No  . Sexual activity: Never    Birth control/protection: None  Lifestyle  . Physical activity:    Days per week: Not on file    Minutes per session: Not on file  . Stress: Not on file  Relationships  . Social connections:    Talks on phone: Not on file    Gets together: Not on file    Attends religious service: Not on file  Active member of club or organization: Not on file    Attends meetings of clubs or organizations: Not on file    Relationship status: Not on file  . Intimate partner violence:    Fear of current or ex partner: Not on file    Emotionally abused: Not on file    Physically abused: Not on file    Forced sexual activity: Not on file  Other Topics Concern  . Not on file  Social History Narrative   Patient is divorced and lives at home and he son lives with her.   Patient has one child.   Patient is disabled.   Patient has some college education.   Patient is right-handed.   Patient drinks 3 servings of caffeine daily         PHYSICAL EXAM  There were no vitals filed for this visit. There is no height or weight on file to calculate BMI.  Generalized: Well developed, in no acute distress   Neurological examination  Mentation: Alert oriented to time, place, history taking. Follows all commands speech and language fluent Cranial nerve II-XII: Pupils were equal round reactive to light. Extraocular movements were full, visual field were full on confrontational test. Facial sensation and strength were normal. Uvula tongue midline. Head turning and shoulder shrug  were normal and symmetric. Motor: The motor testing reveals 5 over 5 strength of all 4 extremities. Good symmetric motor tone is noted throughout.  Sensory: Sensory testing is intact to soft touch on all 4 extremities. No evidence of  extinction is noted.  Coordination: Cerebellar testing reveals good finger-nose-finger and heel-to-shin bilaterally.  Gait and station: Gait is normal. Tandem gait is normal. Romberg is negative. No drift is seen.  Reflexes: Deep tendon reflexes are symmetric and normal bilaterally.   DIAGNOSTIC DATA (LABS, IMAGING, TESTING) - I reviewed patient records, labs, notes, testing and imaging myself where available.  Lab Results  Component Value Date   WBC 12.7 (H) 03/27/2018   HGB 11.8 (L) 03/27/2018   HCT 35.6 (L) 03/27/2018   MCV 80.9 03/27/2018   PLT 247 03/27/2018      Component Value Date/Time   NA 139 03/27/2018 0456   NA 138 02/05/2018 1458   K 3.5 03/27/2018 0456   K 4.2 02/28/2012 1113   CL 103 03/27/2018 0456   CL 107 02/28/2012 1113   CO2 30 03/27/2018 0456   CO2 23 02/28/2012 1113   GLUCOSE 125 (H) 03/27/2018 0456   BUN 9 03/27/2018 0456   BUN 9 02/05/2018 1458   CREATININE 0.61 03/27/2018 0456   CREATININE 0.73 02/28/2012 1113   CALCIUM 8.6 (L) 03/27/2018 0456   CALCIUM 9.5 02/28/2012 1113   PROT 6.4 (L) 03/25/2018 0700   PROT 6.9 02/05/2018 1458   PROT 6.4 02/28/2012 1113   ALBUMIN 3.3 (L) 03/25/2018 0700   ALBUMIN 4.1 02/05/2018 1458   AST 15 03/25/2018 0700   AST 14 02/28/2012 1113   ALT 16 03/25/2018 0700   ALKPHOS 37 (L) 03/25/2018 0700   ALKPHOS 49 02/28/2012 1113   BILITOT 0.5 03/25/2018 0700   BILITOT 0.4 02/05/2018 1458   GFRNONAA >60 03/27/2018 0456   GFRAA >60 03/27/2018 0456   Lab Results  Component Value Date   CHOL 110 12/09/2014   HDL 47 12/09/2014   LDLCALC 50 12/09/2014   TRIG 64 12/09/2014   CHOLHDL 2.3 12/09/2014   No results found for: HGBA1C No results found for: [id number] Lab Results  Component Value Date  TSH 3.700 12/09/2014      ASSESSMENT AND PLAN 37 y.o. year old female  has a past medical history of Anxiety, Depression, Enlarged heart, Fibroid (04/24/2016), GERD (gastroesophageal reflux disease), Leaky heart valve,  Menorrhagia with irregular cycle (04/13/2016), Multiple sclerosis ([place]), Multiple sclerosis exacerbation ([place]), Numbness in right leg (02/13,12/12), Obesity (BMI 35.0-39.9 without comorbidity), Other malaise and fatigue (10/20/2013), Ovarian cyst (04/24/2016), and Regurgitation. here with:  1.  Multiple sclerosis  Overall the patient has done well.  She will continue on plegridy.  I will check blood work today.  We will consider repeating MRI of the brain and cervical spine at the next visit.  She is advised that if her symptoms worsen or she develops new symptoms she should let us know.  She will follow-up in 6 months or sooner if needed.   I spent 15 minutes with the patient. 50% of this time was spent reviewing plan of care   Ward Givens, MSN, NP-C 08/13/2018, 2:47 PM [facility] [address] [place], [place] [address] [phone]

## 2018-08-13 NOTE — Patient Instructions (Signed)
Your Plan:  Continue Plegridy Blood work today If your symptoms worsen or you develop new symptoms please let us know.   Thank you for coming to see [place] at [facility]. I hope we have been able to provide you high quality care today.  You may receive a patient satisfaction survey over the next few weeks. We would appreciate your feedback and comments so that we may continue to improve ourselves and the health of our patients.

## 2018-08-13 NOTE — Progress Notes (Signed)
I have read the note, and I agree with the clinical assessment and plan.  Tahji  K Jodye Scali   

## 2018-08-14 ENCOUNTER — Telehealth: Payer: Self-pay

## 2018-08-14 LAB — CBC WITH DIFFERENTIAL/PLATELET
BASOS: 0 %
Basophils Absolute: 0 10*3/uL (ref 0.0–0.2)
EOS (ABSOLUTE): 0.4 10*3/uL (ref 0.0–0.4)
Eos: 4 %
HEMOGLOBIN: 13.5 g/dL (ref 11.1–15.9)
Hematocrit: 39.8 % (ref 34.0–46.6)
Immature Grans (Abs): 0 10*3/uL (ref 0.0–0.1)
Immature Granulocytes: 0 %
LYMPHS: 24 %
Lymphocytes Absolute: 2.5 10*3/uL (ref 0.7–3.1)
MCH: 26.8 pg (ref 26.6–33.0)
MCHC: 33.9 g/dL (ref 31.5–35.7)
MCV: 79 fL (ref 79–97)
Monocytes Absolute: 0.6 10*3/uL (ref 0.1–0.9)
Monocytes: 6 %
NEUTROS ABS: 6.8 10*3/uL (ref 1.4–7.0)
Neutrophils: 66 %
PLATELETS: 308 10*3/uL (ref 150–450)
RBC: 5.03 x10E6/uL (ref 3.77–5.28)
RDW: 14.2 % (ref 12.3–15.4)
WBC: 10.3 10*3/uL (ref 3.4–10.8)

## 2018-08-14 LAB — COMPREHENSIVE METABOLIC PANEL
ALBUMIN: 4.4 g/dL (ref 3.5–5.5)
ALK PHOS: 46 IU/L (ref 39–117)
ALT: 14 IU/L (ref 0–32)
AST: 12 IU/L (ref 0–40)
Albumin/Globulin Ratio: 1.7 (ref 1.2–2.2)
BILIRUBIN TOTAL: 0.3 mg/dL (ref 0.0–1.2)
BUN / CREAT RATIO: 24 — AB (ref 9–23)
BUN: 15 mg/dL (ref 6–20)
CHLORIDE: 100 mmol/L (ref 96–106)
CO2: 26 mmol/L (ref 20–29)
Calcium: 9.6 mg/dL (ref 8.7–10.2)
Creatinine, Ser: 0.62 mg/dL (ref 0.57–1.00)
GFR calc non Af Amer: 114 mL/min/{1.73_m2} (ref >59)
GFR, EST AFRICAN AMERICAN: 131 mL/min/{1.73_m2} (ref >59)
Globulin, Total: 2.6 g/dL (ref 1.5–4.5)
Glucose: 83 mg/dL (ref 65–99)
POTASSIUM: 4 mmol/L (ref 3.5–5.2)
Sodium: 139 mmol/L (ref 134–144)
TOTAL PROTEIN: 7 g/dL (ref 6.0–8.5)

## 2018-08-14 NOTE — Telephone Encounter (Signed)
Spoke with the patient and she verbalized understanding her results. No questions or concerns at this time.   

## 2018-08-14 NOTE — Telephone Encounter (Signed)
-----   Message from Ward Givens, NP sent at 08/14/2018  8:06 AM EDT ----- Lab work is unremarkable.  Please call patient with results

## 2018-09-07 ENCOUNTER — Other Ambulatory Visit: Payer: Self-pay | Admitting: Neurology

## 2018-09-07 DIAGNOSIS — G35D Multiple sclerosis, unspecified: Secondary | ICD-10-CM

## 2018-09-07 DIAGNOSIS — L568 Other specified acute skin changes due to ultraviolet radiation: Secondary | ICD-10-CM

## 2018-09-07 DIAGNOSIS — H81312 Aural vertigo, left ear: Secondary | ICD-10-CM

## 2018-09-07 DIAGNOSIS — G35 Multiple sclerosis: Secondary | ICD-10-CM

## 2018-10-28 ENCOUNTER — Other Ambulatory Visit: Payer: Self-pay | Admitting: Adult Health

## 2018-11-27 DIAGNOSIS — I10 Essential (primary) hypertension: Secondary | ICD-10-CM | POA: Diagnosis not present

## 2018-11-27 DIAGNOSIS — Z299 Encounter for prophylactic measures, unspecified: Secondary | ICD-10-CM | POA: Diagnosis not present

## 2018-11-27 DIAGNOSIS — Z6841 Body Mass Index (BMI) 40.0 and over, adult: Secondary | ICD-10-CM | POA: Diagnosis not present

## 2018-11-27 DIAGNOSIS — F419 Anxiety disorder, unspecified: Secondary | ICD-10-CM | POA: Diagnosis not present

## 2018-11-27 DIAGNOSIS — Z1331 Encounter for screening for depression: Secondary | ICD-10-CM | POA: Diagnosis not present

## 2018-11-27 DIAGNOSIS — Z1339 Encounter for screening examination for other mental health and behavioral disorders: Secondary | ICD-10-CM | POA: Diagnosis not present

## 2018-11-27 DIAGNOSIS — Z0000 Encounter for general adult medical examination without abnormal findings: Secondary | ICD-10-CM | POA: Diagnosis not present

## 2018-11-27 DIAGNOSIS — Z7189 Other specified counseling: Secondary | ICD-10-CM | POA: Diagnosis not present

## 2018-11-27 DIAGNOSIS — Z79899 Other long term (current) drug therapy: Secondary | ICD-10-CM | POA: Diagnosis not present

## 2018-11-28 ENCOUNTER — Telehealth: Payer: Self-pay | Admitting: Neurology

## 2018-11-28 NOTE — Telephone Encounter (Signed)
PA completed through cover my meds/[employer] for plegridy injections. [id number] Will wait to hear response

## 2018-11-29 NOTE — Telephone Encounter (Signed)
Pa approved until 10/23/2019 for the patient through [employer].

## 2019-01-30 ENCOUNTER — Other Ambulatory Visit: Payer: Self-pay | Admitting: Neurology

## 2019-01-30 ENCOUNTER — Other Ambulatory Visit: Payer: Self-pay | Admitting: Adult Health

## 2019-01-30 DIAGNOSIS — L568 Other specified acute skin changes due to ultraviolet radiation: Secondary | ICD-10-CM

## 2019-01-30 DIAGNOSIS — G35 Multiple sclerosis: Secondary | ICD-10-CM

## 2019-01-30 DIAGNOSIS — H81312 Aural vertigo, left ear: Secondary | ICD-10-CM

## 2019-01-30 NOTE — Telephone Encounter (Signed)
Drug registry check last fill 10-28-18 # 30. Alprazolam 1mg  tabs.

## 2019-02-04 ENCOUNTER — Telehealth: Payer: Self-pay | Admitting: Neurology

## 2019-02-04 NOTE — Telephone Encounter (Signed)
Due to current COVID 19 pandemic, our office is severely reducing in office visits, in order to minimize the risk to our patients and healthcare providers.  Pt understands that although there may be some limitations with this type of visit, we will take all precautions to reduce any security or privacy concerns.  Pt understands that this will be treated like an in office visit and we will file with pt's insurance, and there may be a patient responsible charge related to this service. Pt's email is [email or web address]. Pt understands that the [employer] webex software must be downloaded and operational on the device pt plans to use for the visit. Pt understands that the nurse will be calling to go over pt's chart.

## 2019-02-06 ENCOUNTER — Encounter: Payer: Self-pay | Admitting: Neurology

## 2019-02-06 NOTE — Addendum Note (Signed)
Addended by: Darleen Crocker on: 02/06/2019 04:32 PM   Modules accepted: Orders

## 2019-02-06 NOTE — Telephone Encounter (Signed)
Called the patient and reviewed her chart with her.  Made sure the everything is up to date. Patient states that she did receive the email for her apt on 4/23 and is familiar with how to use this. Patient wanted to mention for her upcoming apt she has noticed that recently she Is having increase in pain and restlessness with her legs around bedtime. Patient wanted to discuss something that may help with RLS.  Patient also wanted to mention that she is noticing behavior changes recently, states that " every little thing is setting her off". She doesn't know what is causing it but she states that she is struggling.  Advised the patient I would document this for them to discuss at the visit. Pt verbalized understanding.

## 2019-02-13 ENCOUNTER — Ambulatory Visit (INDEPENDENT_AMBULATORY_CARE_PROVIDER_SITE_OTHER): Payer: Medicare HMO | Admitting: Neurology

## 2019-02-13 ENCOUNTER — Encounter: Payer: Self-pay | Admitting: Neurology

## 2019-02-13 ENCOUNTER — Other Ambulatory Visit: Payer: Self-pay

## 2019-02-13 DIAGNOSIS — G35 Multiple sclerosis: Secondary | ICD-10-CM

## 2019-02-13 DIAGNOSIS — R454 Irritability and anger: Secondary | ICD-10-CM | POA: Diagnosis not present

## 2019-02-13 DIAGNOSIS — G2581 Restless legs syndrome: Secondary | ICD-10-CM | POA: Diagnosis not present

## 2019-02-13 MED ORDER — PRAMIPEXOLE DIHYDROCHLORIDE 0.25 MG PO TABS: ORAL_TABLET | ORAL | 5 refills | Status: DC

## 2019-02-13 MED ORDER — SERTRALINE HCL 25 MG PO TABS: 25.0000 mg | ORAL_TABLET | Freq: Every day | ORAL | 5 refills | Status: DC

## 2019-02-13 NOTE — Progress Notes (Signed)
Virtual Visit via Video Note  I connected with [name] on 02/13/19 at  2:30 PM EDT by a video enabled telemedicine application and verified that I am speaking with the correct person using two identifiers.   I discussed the limitations of evaluation and management by telemedicine and the availability of in person appointments. The patient expressed understanding and agreed to proceed.     PATIENT: [name] DOB: August 08, 1981  REASON FOR VISIT: new problems of RLS and anger management- irritability, very short tempered.  HISTORY FROM: patient  HISTORY OF PRESENT ILLNESS: MS patient  02/13/19: more self cath needed, more RLS , feeling in pain, cath every day 6 times or more.   [name] is a 38 year old female with a history of multiple sclerosis.  She returns today for follow-up.  She is currently taking Plegridy and tolerating it well.  She denies any new numbness or weakness.  She continues to have numbness in the hands.  Denies any changes with her gait or balance.  No change with her vision.  She continues to see Dr. Zigmund Daniel with urology.  On occasion she does have to self catheterize- 1 time a month.  Her last MRI of the brain and cervical spine was done in 2018 and did not show any significant changes.  She returns today for evaluation.  HISTORY 02/05/18  [name] is a 38 year old female with a history of multiple sclerosis.  She returns today for follow-up.  She is currently on ampyra and plegridy.  She continues to tolerate these medications well.  She does not feel that the Ampyra patient has offered her much benefit.  She denies any new numbness or tingling. Has always had numbness in the hands.  Denies any weakness in extremities.  No change in bowel or bladder.  She continues to have to self catheterize on occasion.  She states that this does not occur daily.  She does see Dr. Zigmund Daniel with urology.  No change in her gait or balance.  Denies any changes with her  vision.  No significant changes with her mood or behavior.  She returns today for evaluation.   REVIEW OF SYSTEMS: Out of a complete 14 system review of symptoms, the patient complains only of the following symptoms, and all other reviewed systems are negative.  See HPI  Leg pain, constantly, irresistble urge to move. RLS _   ,  Feeling exhausted in - sleeping poorly, getting only 4 hours of night time sleep.  Daytime sleepiness.  How likely are you to doze in the following situations: 0 = not likely, 1 = slight chance, 2 = moderate chance, 3 = high chance  Sitting and Reading? 3 Watching Television? 3 Sitting inactive in a public place (theater or meeting)? 2 Lying down in the afternoon when circumstances permit?3- Sitting and talking to someone? 0 Sitting quietly after lunch without alcohol?2 In a car, while stopped for a few minutes in traffic? 1 As a passenger in a car for an hour without a break?2  Total = 16/ 24 points. She restricted her driving.     ALLERGIES: Allergies  Allergen Reactions   Diclofenac Anaphylaxis   Zofran [Ondansetron Hcl] Other (See Comments)    Prolonged QT - unable to have zofran per report   Propoxyphene N-Acetaminophen Rash    HOME MEDICATIONS: Outpatient Medications Prior to Visit  Medication Sig Dispense Refill   ALPRAZolam (XANAX) 1 MG tablet TAKE ONE-HALF TABLET BY MOUTH TWICE A DAY AS  NEEDED FOR ANXIETY 30 tablet 2   chlorthalidone (HYGROTON) 25 MG tablet 25 mg daily.      PLEGRIDY 125 MCG/0.5ML SOPN INJECT 125MCG (1 PEN) SUBCUTANEOUSLY EVERY 14 DAYS 1 mL 1   POTASSIUM CHLORIDE PO Take by mouth. Takes one tablet daily.     vitamin C (ASCORBIC ACID) 500 MG tablet Take 1 tablet by mouth daily.     No facility-administered medications prior to visit.     PAST MEDICAL HISTORY: Past Medical History:  Diagnosis Date   Anxiety    Depression    Enlarged heart    Fibroid 04/24/2016   GERD (gastroesophageal reflux disease)     Leaky heart valve    Menorrhagia with irregular cycle 04/13/2016   Multiple sclerosis ([place])    Dr Lonzo Cloud   Multiple sclerosis exacerbation ([place])    Numbness in right leg 02/13,12/12   Obesity (BMI 35.0-39.9 without comorbidity)    Other malaise and fatigue 10/20/2013   Ovarian cyst 04/24/2016   Regurgitation    cardiac valve. echo 2013 for first eval GILENYA,mod MR,TR    PAST SURGICAL HISTORY: Past Surgical History:  Procedure Laterality Date   BACK SURGERY  2006   CESAREAN SECTION  2000   CHOLECYSTECTOMY  2010   COLONOSCOPY  09/14/2010   Fields-hyperplastic polyps, int hemorrhoids   CYST EXCISION  2006   pilonoidal   DILITATION & CURRETTAGE/HYSTROSCOPY WITH NOVASURE ABLATION N/A 07/21/2016   Procedure: DILATATION & CURETTAGE/HYSTEROSCOPY WITH NOVASURE ABLATION;  Surgeon: Jonnie Kind, MD;  Location: [facility];  Service: Gynecology;  Laterality: N/A;  Novasure lenght - 5.0 width - 4.1 power - 113 time - 2 min 03 seconds   ESOPHAGOGASTRODUODENOSCOPY  08/05/2008   Fields-incomplete Schatzki's ring, distal esophageal erosions/esophagitis(mild), small HH, NEGATIVE h pylori   LAPAROSCOPIC APPENDECTOMY N/A 03/26/2018   Procedure: APPENDECTOMY LAPAROSCOPIC;  Surgeon: Aviva Signs, MD;  Location: [facility];  Service: General;  Laterality: N/A;   LAPAROSCOPIC BILATERAL SALPINGECTOMY Bilateral 07/21/2016   Procedure: LAPAROSCOPIC RIGHT SALPINGECTOMY AND LEFT OOPHECTOMY;  Surgeon: Jonnie Kind, MD;  Location: [facility];  Service: Gynecology;  Laterality: Bilateral;   LUMBAR DISC SURGERY     US ECHOCARDIOGRAPHY  03/19/2012   RV mildly dilated,RA mod. dilated,strial septum aneurysmal,Mod. MR & TR    FAMILY HISTORY: Family History  Problem Relation Age of Onset   Other Mother        lost leg, has trouble swallowing   Thyroid nodules Mother    Heart attack Father    Coronary artery disease Father    Diabetes Father    Rheum arthritis Father    Colon cancer  Paternal Uncle 71   Colon cancer Paternal Grandmother 80   Diabetes Paternal Grandmother    Cancer Paternal Grandmother        breast,lung   Cancer Maternal Grandmother 63       kind unknown   Diabetes Maternal Grandmother    Myasthenia gravis Maternal Grandfather    Other Sister        tumor on ovary   ADD / ADHD Son    Heart attack Paternal Grandfather    Multiple sclerosis Other        paternal great aunt    SOCIAL HISTORY: Social History   Socioeconomic History   Marital status: Married    Spouse name: Divorced    Number of children: 1   Years of education: college   Highest education level: Not on file  Occupational History   Occupation:  disabled  Social Designer, fashion/clothing strain: Not on file   Food insecurity:    Worry: Not on file    Inability: Not on file   Transportation needs:    Medical: Not on file    Non-medical: Not on file  Tobacco Use   Smoking status: Former Smoker    Packs/day: 1.00    Years: 20.00    Pack years: 20.00    Types: Cigarettes    Last attempt to quit: 02/15/2012    Years since quitting: 7.0   Smokeless tobacco: Never Used  Substance and Sexual Activity   Alcohol use: No    Alcohol/week: 0.0 standard drinks   Drug use: No   Sexual activity: Never    Birth control/protection: None  Lifestyle   Physical activity:    Days per week: Not on file    Minutes per session: Not on file   Stress: Not on file  Relationships   Social connections:    Talks on phone: Not on file    Gets together: Not on file    Attends religious service: Not on file    Active member of club or organization: Not on file    Attends meetings of clubs or organizations: Not on file    Relationship status: Not on file   Intimate partner violence:    Fear of current or ex partner: Not on file    Emotionally abused: Not on file    Physically abused: Not on file    Forced sexual activity: Not on file  Other Topics Concern    Not on file  Social History Narrative   Patient is divorced and lives at home and he son lives with her.   Patient has one child.   Patient is disabled.   Patient has some college education.   Patient is right-handed.   Patient drinks 3 servings of caffeine daily         PHYSICAL EXAM  There were no vitals filed for this visit. There is no height or weight on file to calculate BMI.  Generalized: Well developed, in no acute distress   Neurological examination  Mentation: Alert oriented to time, place, history taking. Follows all commands speech and language fluent Cranial nerve:   Loss of smell or taste-Pupils were equal round -Extraocular movements were full, symmetric facial movements.  Uvula and tongue midline.  Head turning and shoulder shrug  were normal and symmetric. Motor: The motor testing reveals limp on the right, leg pain - ROM unrestricted at the hip, but foot drop on the right.   DIAGNOSTIC DATA (LABS, IMAGING, TESTING) - I reviewed patient records, labs, notes, testing and imaging myself where available.  Lab Results  Component Value Date   WBC 10.3 08/13/2018   HGB 13.5 08/13/2018   HCT 39.8 08/13/2018   MCV 79 08/13/2018   PLT 308 08/13/2018      Component Value Date/Time   NA 139 08/13/2018 1513   K 4.0 08/13/2018 1513   K 4.2 02/28/2012 1113   CL 100 08/13/2018 1513   CL 107 02/28/2012 1113   CO2 26 08/13/2018 1513   CO2 23 02/28/2012 1113   GLUCOSE 83 08/13/2018 1513   GLUCOSE 125 (H) 03/27/2018 0456   BUN 15 08/13/2018 1513   CREATININE 0.62 08/13/2018 1513   CREATININE 0.73 02/28/2012 1113   CALCIUM 9.6 08/13/2018 1513   CALCIUM 9.5 02/28/2012 1113   PROT 7.0 08/13/2018 1513   PROT 6.4  02/28/2012 1113   ALBUMIN 4.4 08/13/2018 1513   AST 12 08/13/2018 1513   AST 14 02/28/2012 1113   ALT 14 08/13/2018 1513   ALKPHOS 46 08/13/2018 1513   ALKPHOS 49 02/28/2012 1113   BILITOT 0.3 08/13/2018 1513   GFRNONAA 114 08/13/2018 1513   GFRAA  131 08/13/2018 1513      ASSESSMENT AND PLAN 38 y.o. year old female  has a past medical history of Anxiety, Depression, Enlarged heart, Fibroid (04/24/2016), GERD (gastroesophageal reflux disease), Leaky heart valve, Menorrhagia with irregular cycle (04/13/2016), Multiple sclerosis ([place]), Multiple sclerosis exacerbation ([place]), Numbness in right leg (02/13,12/12), Obesity (BMI 35.0-39.9 without comorbidity), Other malaise and fatigue (10/20/2013), Ovarian cyst (04/24/2016), and Regurgitation. seen on Video with:  1.  Multiple sclerosis 2. RLS due to pain, legs are  tingling, burning will  try Mirapex at lowest dose.   Overall the patient has done well.   She will continue on plegridy.   I will check blood work today.   We will consider repeating MRI of the brain and cervical spine at the next visit.   She is advised that if her symptoms worsen or she develops new symptoms she should let us know.  She will follow-up in 6 months or sooner if needed.  ollow Up Instructions: Rv in 6 month     I discussed the assessment and treatment plan with the patient. The patient was provided an opportunity to ask questions and all were answered. The patient agreed with the plan and demonstrated an understanding of the instructions.   The patient was advised to call back or seek an in-person evaluation if the symptoms worsen or if the condition fails to improve as anticipated.  I provided 25 minutes of non-face-to-face time during this encounter.   Larey Seat, MD   02/13/2019, 2:47 PM [facility] [address], [place], [place] [address] [phone]    F

## 2019-02-13 NOTE — Patient Instructions (Signed)
Pramipexole tablets What is this medicine? PRAMIPEXOLE (pra mi PEX ole) is used to treat symptoms of Parkinson's disease. It is also used to treat Restless Legs Syndrome. This medicine may be used for other purposes; ask your health care provider or pharmacist if you have questions. COMMON BRAND NAME(S): Mirapex What should I tell my health care provider before I take this medicine? They need to know if you have any of these conditions: -dizziness or fainting spells -feel sleepy or have fallen asleep suddenly during the day -have trouble controlling your muscles (dyskinesia) -if you often drink alcohol -heart disease -kidney disease -low blood pressure -mental illness -narcolepsy -on hemodialysis -sleep apnea -an unusual or allergic reaction to pramipexole, other medicines, foods, dyes, or preservatives -pregnant or trying to get pregnant -breast-feeding How should I use this medicine? Take this medicine by mouth with a glass of water. Follow the directions on the prescription label. Take with food. Take your doses at regular intervals. Do not stop taking this medicine suddenly except upon the advice of your doctor. Stopping this medicine too quickly may cause side effects or your condition may worsen. Talk to your pediatrician regarding the use of this medicine in children. Special care may be needed. Overdosage: If you think you have taken too much of this medicine contact a poison control center or emergency room at once. NOTE: This medicine is only for you. Do not share this medicine with others. What if I miss a dose? If you miss a dose, take it as soon as you can. If it is almost time for your next dose, take only that dose. Do not take double or extra doses. What may interact with this medicine? -alcohol -antihistamines for allergy, cough and cold -certain medicines for depression, anxiety, or psychotic disturbances -certain medicines for seizures like phenobarbital,  primidone -certain medicines for sleep -general anesthetics like halothane, isoflurane, methoxyflurane, propofol -medicines for blood pressure -medicines that relax muscles for surgery -metoclopramide -narcotic medicines for pain This list may not describe all possible interactions. Give your health care provider a list of all the medicines, herbs, non-prescription drugs, or dietary supplements you use. Also tell them if you smoke, drink alcohol, or use illegal drugs. Some items may interact with your medicine. What should I watch for while using this medicine? Visit your doctor or health care professional for regular checks on your progress. It may be several weeks or months before you feel the full effect of this medicine. Continue to take your medicine on a regular schedule. You may get drowsy or dizzy. Do not drive, use machinery, or do anything that needs mental alertness until you know how this drug affects you. Do not stand or sit up quickly, especially if you are an older patient. This reduces the risk of dizzy or fainting spells. If you find that you have sudden feelings of wanting to sleep during normal activities, like cooking, watching television, or while driving or riding in a car, you should contact your health care professional. Your mouth may get dry. Chewing sugarless gum or sucking hard candy, and drinking plenty of water may help. Contact your doctor if the problem does not go away or is severe. There have been reports of increased sexual urges or other strong urges such as gambling while taking some medicines for Parkinson's disease. If you experience any of these urges while taking this medicine, you should report it to your health care provider as soon as possible. Talk with your doctor if you  have posture changes you cannot control. These may include your neck bending forward, your spine bending forward at the waist, or tilting sideways when you sit, stand, or walk. What side  effects may I notice from receiving this medicine? Side effects that you should report to your doctor or health care professional as soon as possible: -allergic reactions like skin rash, itching or hives, swelling of the face, lips, or tongue -changes in emotions or moods -changes in vision -confusion -falling asleep during normal activities like driving -hallucination, loss of contact with reality -involuntary muscle contractions; difficulty swallowing; difficulty walking -new or increased gambling urges, sexual urges, uncontrolled spending, binge or compulsive eating, or other urges -new or worsening curve in the spine -signs and symptoms of low blood pressure like dizziness; feeling faint or lightheaded; falls; unusually weak or tired -uncontrollable movements of the arms, face, head, mouth, neck, or upper body Side effects that usually do not require medical attention (report to your doctor or health care professional if they continue or are bothersome): -constipation -drowsiness -dry mouth -nausea -trouble sleeping This list may not describe all possible side effects. Call your doctor for medical advice about side effects. You may report side effects to FDA at 1-800-FDA-1088. Where should I keep my medicine? Keep out of the reach of children. Store at room temperature between 15 and 30 degrees C (59 and 86 degrees F). Protect from light. Throw away any unused medicine after the expiration date. NOTE: This sheet is a summary. It may not cover all possible information. If you have questions about this medicine, talk to your doctor, pharmacist, or health care provider.  2019 Elsevier/Gold Standard (2017-04-19 16:50:54) Sertraline tablets What is this medicine? SERTRALINE (SER tra leen) is used to treat depression. It may also be used to treat obsessive compulsive disorder, panic disorder, post-trauma stress, premenstrual dysphoric disorder (PMDD) or social anxiety. This medicine may be  used for other purposes; ask your health care provider or pharmacist if you have questions. COMMON BRAND NAME(S): Zoloft What should I tell my health care provider before I take this medicine? They need to know if you have any of these conditions: -bleeding disorders -bipolar disorder or a family history of bipolar disorder -glaucoma -heart disease -high blood pressure -history of irregular heartbeat -history of low levels of calcium, magnesium, or potassium in the blood -if you often drink alcohol -liver disease -receiving electroconvulsive therapy -seizures -suicidal thoughts, plans, or attempt; a previous suicide attempt by you or a family member -take medicines that treat or prevent blood clots -thyroid disease -an unusual or allergic reaction to sertraline, other medicines, foods, dyes, or preservatives -pregnant or trying to get pregnant -breast-feeding How should I use this medicine? Take this medicine by mouth with a glass of water. Follow the directions on the prescription label. You can take it with or without food. Take your medicine at regular intervals. Do not take your medicine more often than directed. Do not stop taking this medicine suddenly except upon the advice of your doctor. Stopping this medicine too quickly may cause serious side effects or your condition may worsen. A special MedGuide will be given to you by the pharmacist with each prescription and refill. Be sure to read this information carefully each time. Talk to your pediatrician regarding the use of this medicine in children. While this drug may be prescribed for children as young as 7 years for selected conditions, precautions do apply. Overdosage: If you think you have taken too much of  this medicine contact a poison control center or emergency room at once. NOTE: This medicine is only for you. Do not share this medicine with others. What if I miss a dose? If you miss a dose, take it as soon as you can.  If it is almost time for your next dose, take only that dose. Do not take double or extra doses. What may interact with this medicine? Do not take this medicine with any of the following medications: -cisapride -dofetilide -dronedarone -linezolid -MAOIs like Carbex, Eldepryl, Marplan, Nardil, and Parnate -methylene blue (injected into a vein) -pimozide -thioridazine This medicine may also interact with the following medications: -alcohol -amphetamines -aspirin and aspirin-like medicines -certain medicines for depression, anxiety, or psychotic disturbances -certain medicines for fungal infections like ketoconazole, fluconazole, posaconazole, and itraconazole -certain medicines for irregular heart beat like flecainide, quinidine, propafenone -certain medicines for migraine headaches like almotriptan, eletriptan, frovatriptan, naratriptan, rizatriptan, sumatriptan, zolmitriptan -certain medicines for sleep -certain medicines for seizures like carbamazepine, valproic acid, phenytoin -certain medicines that treat or prevent blood clots like warfarin, enoxaparin, dalteparin -cimetidine -digoxin -diuretics -fentanyl -isoniazid -lithium -NSAIDs, medicines for pain and inflammation, like ibuprofen or naproxen -other medicines that prolong the QT interval (cause an abnormal heart rhythm) -rasagiline -safinamide -supplements like St. John's wort, kava kava, valerian -tolbutamide -tramadol -tryptophan This list may not describe all possible interactions. Give your health care provider a list of all the medicines, herbs, non-prescription drugs, or dietary supplements you use. Also tell them if you smoke, drink alcohol, or use illegal drugs. Some items may interact with your medicine. What should I watch for while using this medicine? Tell your doctor if your symptoms do not get better or if they get worse. Visit your doctor or health care professional for regular checks on your progress.  Because it may take several weeks to see the full effects of this medicine, it is important to continue your treatment as prescribed by your doctor. Patients and their families should watch out for new or worsening thoughts of suicide or depression. Also watch out for sudden changes in feelings such as feeling anxious, agitated, panicky, irritable, hostile, aggressive, impulsive, severely restless, overly excited and hyperactive, or not being able to sleep. If this happens, especially at the beginning of treatment or after a change in dose, call your health care professional. Dennis Bast may get drowsy or dizzy. Do not drive, use machinery, or do anything that needs mental alertness until you know how this medicine affects you. Do not stand or sit up quickly, especially if you are an older patient. This reduces the risk of dizzy or fainting spells. Alcohol may interfere with the effect of this medicine. Avoid alcoholic drinks. Your mouth may get dry. Chewing sugarless gum or sucking hard candy, and drinking plenty of water may help. Contact your doctor if the problem does not go away or is severe. What side effects may I notice from receiving this medicine? Side effects that you should report to your doctor or health care professional as soon as possible: -allergic reactions like skin rash, itching or hives, swelling of the face, lips, or tongue -anxious -black, tarry stools -changes in vision -confusion -elevated mood, decreased need for sleep, racing thoughts, impulsive behavior -eye pain -fast, irregular heartbeat -feeling faint or lightheaded, falls -feeling agitated, angry, or irritable -hallucination, loss of contact with reality -loss of balance or coordination -loss of memory -painful or prolonged erections -restlessness, pacing, inability to keep still -seizures -stiff muscles -suicidal thoughts or other  mood changes -trouble sleeping -unusual bleeding or bruising -unusually weak or  tired -vomiting Side effects that usually do not require medical attention (report to your doctor or health care professional if they continue or are bothersome): -change in appetite or weight -change in sex drive or performance -diarrhea -increased sweating -indigestion, nausea -tremors This list may not describe all possible side effects. Call your doctor for medical advice about side effects. You may report side effects to FDA at 1-800-FDA-1088. Where should I keep my medicine? Keep out of the reach of children. Store at room temperature between 15 and 30 degrees C (59 and 86 degrees F). Throw away any unused medicine after the expiration date. NOTE: This sheet is a summary. It may not cover all possible information. If you have questions about this medicine, talk to your doctor, pharmacist, or health care provider.  2019 Elsevier/Gold Standard (2016-10-13 14:17:49)

## 2019-02-21 DIAGNOSIS — R339 Retention of urine, unspecified: Secondary | ICD-10-CM | POA: Diagnosis not present

## 2019-04-15 DIAGNOSIS — N390 Urinary tract infection, site not specified: Secondary | ICD-10-CM | POA: Diagnosis not present

## 2019-04-15 DIAGNOSIS — R3911 Hesitancy of micturition: Secondary | ICD-10-CM | POA: Diagnosis not present

## 2019-04-15 DIAGNOSIS — Z299 Encounter for prophylactic measures, unspecified: Secondary | ICD-10-CM | POA: Diagnosis not present

## 2019-04-15 DIAGNOSIS — I10 Essential (primary) hypertension: Secondary | ICD-10-CM | POA: Diagnosis not present

## 2019-04-15 DIAGNOSIS — Z6838 Body mass index (BMI) 38.0-38.9, adult: Secondary | ICD-10-CM | POA: Diagnosis not present

## 2019-05-09 ENCOUNTER — Other Ambulatory Visit: Payer: Self-pay | Admitting: Neurology

## 2019-05-09 DIAGNOSIS — H81312 Aural vertigo, left ear: Secondary | ICD-10-CM

## 2019-05-09 DIAGNOSIS — G35 Multiple sclerosis: Secondary | ICD-10-CM

## 2019-05-09 DIAGNOSIS — L568 Other specified acute skin changes due to ultraviolet radiation: Secondary | ICD-10-CM

## 2019-05-20 DIAGNOSIS — N390 Urinary tract infection, site not specified: Secondary | ICD-10-CM | POA: Diagnosis not present

## 2019-05-20 DIAGNOSIS — M419 Scoliosis, unspecified: Secondary | ICD-10-CM | POA: Diagnosis not present

## 2019-05-20 DIAGNOSIS — I10 Essential (primary) hypertension: Secondary | ICD-10-CM | POA: Diagnosis not present

## 2019-05-20 DIAGNOSIS — F419 Anxiety disorder, unspecified: Secondary | ICD-10-CM | POA: Diagnosis not present

## 2019-05-20 DIAGNOSIS — Z299 Encounter for prophylactic measures, unspecified: Secondary | ICD-10-CM | POA: Diagnosis not present

## 2019-05-20 DIAGNOSIS — Z6839 Body mass index (BMI) 39.0-39.9, adult: Secondary | ICD-10-CM | POA: Diagnosis not present

## 2019-05-26 ENCOUNTER — Other Ambulatory Visit: Payer: Medicare HMO

## 2019-05-26 ENCOUNTER — Other Ambulatory Visit: Payer: Self-pay

## 2019-05-26 DIAGNOSIS — Z20822 Contact with and (suspected) exposure to covid-19: Secondary | ICD-10-CM

## 2019-05-26 DIAGNOSIS — R6889 Other general symptoms and signs: Secondary | ICD-10-CM | POA: Diagnosis not present

## 2019-05-27 LAB — NOVEL CORONAVIRUS, NAA: SARS-CoV-2, NAA: NOT DETECTED

## 2019-07-25 DIAGNOSIS — R339 Retention of urine, unspecified: Secondary | ICD-10-CM | POA: Diagnosis not present

## 2019-08-20 ENCOUNTER — Encounter: Payer: Self-pay | Admitting: Adult Health

## 2019-08-20 ENCOUNTER — Telehealth: Payer: Self-pay | Admitting: Adult Health

## 2019-08-20 ENCOUNTER — Other Ambulatory Visit: Payer: Self-pay

## 2019-08-20 ENCOUNTER — Ambulatory Visit (INDEPENDENT_AMBULATORY_CARE_PROVIDER_SITE_OTHER): Payer: Medicare HMO | Admitting: Adult Health

## 2019-08-20 VITALS — BP 126/83 | HR 69 | Temp 97.9°F | Ht 68.0 in | Wt 260.2 lb

## 2019-08-20 DIAGNOSIS — G2581 Restless legs syndrome: Secondary | ICD-10-CM

## 2019-08-20 DIAGNOSIS — G35 Multiple sclerosis: Secondary | ICD-10-CM | POA: Diagnosis not present

## 2019-08-20 NOTE — Progress Notes (Signed)
PATIENT: [name] DOB: [birth year]  REASON FOR VISIT: follow up HISTORY FROM: patient  HISTORY OF PRESENT ILLNESS: Today 08/20/19:  Ms. Odam is a 38 year old female with a history of multiple sclerosis and restless leg syndrome.  She returns today for follow-up.  She continues on Plegridy.  She continues to tolerate this medication well.  She denies any new numbness or weakness.  Denies any changes with her gait or balance.  She is now having to self cath up to 3 times a day.  She has not seen Dr. Zigmund Daniel in 2 years.  She states that she keeps a urinary tract infection despite being on preventative medication.  No change in her vision.  Her last MRI of the brain and cervical spine was in 2018.  She reports that Mirapex has been beneficial for restless leg syndrome.  She continues to take 0.25 mg at dinnertime and at bedtime.  She returns today for evaluation.  REVIEW OF SYSTEMS: Out of a complete 14 system review of symptoms, the patient complains only of the following symptoms, and all other reviewed systems are negative.  See HPI ALLERGIES: Allergies  Allergen Reactions  . Diclofenac Anaphylaxis  . Zofran [Ondansetron Hcl] Other (See Comments)    Prolonged QT - unable to have zofran per report  . Propoxyphene N-Acetaminophen Rash    HOME MEDICATIONS: Outpatient Medications Prior to Visit  Medication Sig Dispense Refill  . ALPRAZolam (XANAX) 1 MG tablet TAKE ONE-HALF TABLET BY MOUTH TWICE A DAY AS NEEDED FOR ANXIETY 30 tablet 2  . chlorthalidone (HYGROTON) 25 MG tablet 25 mg daily.     Marland Kitchen PLEGRIDY 125 MCG/0.5ML SOPN INJECT 125 MCG (1 PEN) SUBCUTANEOUSLY EVERY 14 DAYS 6 pen 11  . POTASSIUM CHLORIDE PO Take by mouth. Takes one tablet daily.    . pramipexole (MIRAPEX) 0.25 MG tablet Take one at dinner and one at bedtime. 60 tablet 5  . sertraline (ZOLOFT) 25 MG tablet Take 1 tablet (25 mg total) by mouth at bedtime. 30 tablet 5  . vitamin C (ASCORBIC ACID) 500 MG tablet  Take 1 tablet by mouth daily.     No facility-administered medications prior to visit.     PAST MEDICAL HISTORY: Past Medical History:  Diagnosis Date  . Anxiety   . Depression   . Enlarged heart   . Fibroid 04/24/2016  . GERD (gastroesophageal reflux disease)   . Leaky heart valve   . Menorrhagia with irregular cycle 04/13/2016  . Multiple sclerosis ([place])    Dr Lonzo Cloud  . Multiple sclerosis exacerbation ([place])   . Numbness in right leg 02/13,12/12  . Obesity (BMI 35.0-39.9 without comorbidity)   . Other malaise and fatigue 10/20/2013  . Ovarian cyst 04/24/2016  . Regurgitation    cardiac valve. echo 2013 for first eval GILENYA,mod MR,TR    PAST SURGICAL HISTORY: Past Surgical History:  Procedure Laterality Date  . BACK SURGERY  2006  . CESAREAN SECTION  2000  . CHOLECYSTECTOMY  2010  . COLONOSCOPY  09/14/2010   Fields-hyperplastic polyps, int hemorrhoids  . CYST EXCISION  2006   pilonoidal  . DILITATION & CURRETTAGE/HYSTROSCOPY WITH NOVASURE ABLATION N/A 07/21/2016   Procedure: DILATATION & CURETTAGE/HYSTEROSCOPY WITH NOVASURE ABLATION;  Surgeon: Jonnie Kind, MD;  Location: [facility];  Service: Gynecology;  Laterality: N/A;  Novasure lenght - 5.0 width - 4.1 power - 113 time - 2 min 03 seconds  . ESOPHAGOGASTRODUODENOSCOPY  08/05/2008   Fields-incomplete Schatzki's ring, distal esophageal erosions/esophagitis(mild),  small HH, NEGATIVE h pylori  . LAPAROSCOPIC APPENDECTOMY N/A 03/26/2018   Procedure: APPENDECTOMY LAPAROSCOPIC;  Surgeon: Aviva Signs, MD;  Location: [facility];  Service: General;  Laterality: N/A;  . LAPAROSCOPIC BILATERAL SALPINGECTOMY Bilateral 07/21/2016   Procedure: LAPAROSCOPIC RIGHT SALPINGECTOMY AND LEFT OOPHECTOMY;  Surgeon: Jonnie Kind, MD;  Location: [facility];  Service: Gynecology;  Laterality: Bilateral;  . LUMBAR DISC SURGERY    . US ECHOCARDIOGRAPHY  03/19/2012   RV mildly dilated,RA mod. dilated,strial septum aneurysmal,Mod. MR & TR     FAMILY HISTORY: Family History  Problem Relation Age of Onset  . Other Mother        lost leg, has trouble swallowing  . Thyroid nodules Mother   . Heart attack Father   . Coronary artery disease Father   . Diabetes Father   . Rheum arthritis Father   . Colon cancer Paternal Uncle 60  . Colon cancer Paternal Grandmother 68  . Diabetes Paternal Grandmother   . Cancer Paternal Grandmother        breast,lung  . Cancer Maternal Grandmother 12       kind unknown  . Diabetes Maternal Grandmother   . Myasthenia gravis Maternal Grandfather   . Other Sister        tumor on ovary  . ADD / ADHD Son   . Heart attack Paternal Grandfather   . Multiple sclerosis Other        paternal great aunt    SOCIAL HISTORY: Social History   Socioeconomic History  . Marital status: Married    Spouse name: Divorced   . Number of children: 1  . Years of education: college  . Highest education level: Not on file  Occupational History  . Occupation: disabled  Social Needs  . Financial resource strain: Not on file  . Food insecurity    Worry: Not on file    Inability: Not on file  . Transportation needs    Medical: Not on file    Non-medical: Not on file  Tobacco Use  . Smoking status: Former Smoker    Packs/day: 1.00    Years: 20.00    Pack years: 20.00    Types: Cigarettes    Quit date: 02/15/2012    Years since quitting: 7.5  . Smokeless tobacco: Never Used  Substance and Sexual Activity  . Alcohol use: No    Alcohol/week: 0.0 standard drinks  . Drug use: No  . Sexual activity: Never    Birth control/protection: None  Lifestyle  . Physical activity    Days per week: Not on file    Minutes per session: Not on file  . Stress: Not on file  Relationships  . Social Herbalist on phone: Not on file    Gets together: Not on file    Attends religious service: Not on file    Active member of club or organization: Not on file    Attends meetings of clubs or organizations:  Not on file    Relationship status: Not on file  . Intimate partner violence    Fear of current or ex partner: Not on file    Emotionally abused: Not on file    Physically abused: Not on file    Forced sexual activity: Not on file  Other Topics Concern  . Not on file  Social History Narrative   Patient is divorced and lives at home and he son lives with her.   Patient has one  child.   Patient is disabled.   Patient has some college education.   Patient is right-handed.   Patient drinks 3 servings of caffeine daily         PHYSICAL EXAM  Vitals:   08/20/19 1319  BP: 126/83  Pulse: 69  Temp: 97.9 F (36.6 C)  TempSrc: Oral  Weight: 260 lb 3.2 oz (118 kg)  Height: 5\' 8"  (1.727 m)   Body mass index is 39.56 kg/m.  Generalized: Well developed, in no acute distress   Neurological examination  Mentation: Alert oriented to time, place, history taking. Follows all commands speech and language fluent Cranial nerve II-XII: Pupils were equal round reactive to light. Extraocular movements were full, visual field were full on confrontational test.  Head turning and shoulder shrug  were normal and symmetric. Motor: The motor testing reveals 5 over 5 strength of all 4 extremities. Good symmetric motor tone is noted throughout.  Sensory: Sensory testing is intact to soft touch on all 4 extremities. No evidence of extinction is noted.  Coordination: Cerebellar testing reveals good finger-nose-finger and heel-to-shin bilaterally.  Gait and station: Gait is normal.  Reflexes: Deep tendon reflexes are symmetric and normal bilaterally.   DIAGNOSTIC DATA (LABS, IMAGING, TESTING) - I reviewed patient records, labs, notes, testing and imaging myself where available.  Lab Results  Component Value Date   WBC 10.3 08/13/2018   HGB 13.5 08/13/2018   HCT 39.8 08/13/2018   MCV 79 08/13/2018   PLT 308 08/13/2018      Component Value Date/Time   NA 139 08/13/2018 1513   K 4.0 08/13/2018  1513   K 4.2 02/28/2012 1113   CL 100 08/13/2018 1513   CL 107 02/28/2012 1113   CO2 26 08/13/2018 1513   CO2 23 02/28/2012 1113   GLUCOSE 83 08/13/2018 1513   GLUCOSE 125 (H) 03/27/2018 0456   BUN 15 08/13/2018 1513   CREATININE 0.62 08/13/2018 1513   CREATININE 0.73 02/28/2012 1113   CALCIUM 9.6 08/13/2018 1513   CALCIUM 9.5 02/28/2012 1113   PROT 7.0 08/13/2018 1513   PROT 6.4 02/28/2012 1113   ALBUMIN 4.4 08/13/2018 1513   AST 12 08/13/2018 1513   AST 14 02/28/2012 1113   ALT 14 08/13/2018 1513   ALKPHOS 46 08/13/2018 1513   ALKPHOS 49 02/28/2012 1113   BILITOT 0.3 08/13/2018 1513   GFRNONAA 114 08/13/2018 1513   GFRAA 131 08/13/2018 1513   Lab Results  Component Value Date   CHOL 110 12/09/2014   HDL 47 12/09/2014   LDLCALC 50 12/09/2014   TRIG 64 12/09/2014   CHOLHDL 2.3 12/09/2014    Lab Results  Component Value Date   TSH 3.700 12/09/2014      ASSESSMENT AND PLAN 38 y.o. year old female  has a past medical history of Anxiety, Depression, Enlarged heart, Fibroid (04/24/2016), GERD (gastroesophageal reflux disease), Leaky heart valve, Menorrhagia with irregular cycle (04/13/2016), Multiple sclerosis ([place]), Multiple sclerosis exacerbation ([place]), Numbness in right leg (02/13,12/12), Obesity (BMI 35.0-39.9 without comorbidity), Other malaise and fatigue (10/20/2013), Ovarian cyst (04/24/2016), and Regurgitation. here with:    Multiple sclerosis  -Continue Plegridy -Blood work today, CBC, CMP -MRI of the brain and cervical spine to look for progression of MS - Follow with Dr. Zigmund Daniel for urinary issues  RLS   -Continue Mirapex 0.25 mg at dinner and at bedtime  Patient is advised that if her symptoms worsen or she develops new symptoms she should let us know.  She  will follow-up in 6 months or sooner if needed.     Ward Givens, MSN, NP-C 08/20/2019, 1:31 PM [facility] [address] [place], [place] [address] [phone]

## 2019-08-20 NOTE — Patient Instructions (Signed)
-  Continue Plegridy -Blood work today, CBC, CMP -MRI of the brain and cervical spine to look for progression of MS  -Continue Mirapex 0.25 mg at dinner and at bedtime

## 2019-08-20 NOTE — Telephone Encounter (Signed)
Mcarthur Rossetti Josem KaufmannVJ:[id number] & [phone] (exp. 08/20/19 to 09/19/19)/medicaid order sent to [facility]. They will reach out to the patient to schedule.

## 2019-08-21 ENCOUNTER — Telehealth: Payer: Self-pay | Admitting: Neurology

## 2019-08-21 LAB — COMPREHENSIVE METABOLIC PANEL
ALT: 14 IU/L (ref 0–32)
AST: 12 IU/L (ref 0–40)
Albumin/Globulin Ratio: 1.4 (ref 1.2–2.2)
Albumin: 4.2 g/dL (ref 3.8–4.8)
Alkaline Phosphatase: 62 IU/L (ref 39–117)
BUN/Creatinine Ratio: 13 (ref 9–23)
BUN: 10 mg/dL (ref 6–24)
Bilirubin Total: 0.4 mg/dL (ref 0.0–1.2)
CO2: 22 mmol/L (ref 20–29)
Calcium: 9.9 mg/dL (ref 8.7–10.2)
Chloride: 107 mmol/L — ABNORMAL HIGH (ref 96–106)
Creatinine, Ser: 0.77 mg/dL (ref 0.57–1.00)
GFR calc Af Amer: 112 mL/min/{1.73_m2} (ref >59)
GFR calc non Af Amer: 97 mL/min/{1.73_m2} (ref >59)
Globulin, Total: 2.9 g/dL (ref 1.5–4.5)
Glucose: 83 mg/dL (ref 65–99)
Potassium: 4.8 mmol/L (ref 3.5–5.2)
Sodium: 141 mmol/L (ref 134–144)
Total Protein: 7.1 g/dL (ref 6.0–8.5)

## 2019-08-21 LAB — CBC WITH DIFFERENTIAL/PLATELET
Basophils Absolute: 0.1 10*3/uL (ref 0.0–0.2)
Basos: 1 %
EOS (ABSOLUTE): 0.3 10*3/uL (ref 0.0–0.4)
Eos: 3 %
Hematocrit: 40.9 % (ref 34.0–46.6)
Hemoglobin: 13.7 g/dL (ref 11.1–15.9)
Immature Grans (Abs): 0 10*3/uL (ref 0.0–0.1)
Immature Granulocytes: 0 %
Lymphocytes Absolute: 2.3 10*3/uL (ref 0.7–3.1)
Lymphs: 21 %
MCH: 28.4 pg (ref 26.6–33.0)
MCHC: 33.5 g/dL (ref 31.5–35.7)
MCV: 85 fL (ref 79–97)
Monocytes Absolute: 0.5 10*3/uL (ref 0.1–0.9)
Monocytes: 5 %
Neutrophils Absolute: 7.5 10*3/uL — ABNORMAL HIGH (ref 1.4–7.0)
Neutrophils: 70 %
Platelets: 339 10*3/uL (ref 150–450)
RBC: 4.83 x10E6/uL (ref 3.77–5.28)
RDW: 13.1 % (ref 11.7–15.4)
WBC: 10.7 10*3/uL (ref 3.4–10.8)

## 2019-08-21 NOTE — Telephone Encounter (Signed)
Called the patient and made her aware of the normal lab work. She verbalized understanding and had no questions.

## 2019-08-21 NOTE — Telephone Encounter (Signed)
-----   Message from Darleen Crocker, RN sent at 08/21/2019  2:08 PM EDT -----  ----- Message ----- From: Ward Givens, NP Sent: 08/21/2019   9:36 AM EDT To: Brandon Melnick, RN  Labs results are unremarkable. Please call patient with results.

## 2019-08-25 ENCOUNTER — Telehealth: Payer: Self-pay | Admitting: *Deleted

## 2019-08-25 NOTE — Telephone Encounter (Signed)
Sent mychart message

## 2019-09-15 ENCOUNTER — Telehealth: Payer: Self-pay | Admitting: *Deleted

## 2019-09-15 NOTE — Telephone Encounter (Signed)
Pt is scheduled for MRI --09-22-19 and MRI auth expires 09-19-19.  She stated that it needs to be extended.  She can't do this. Just forwarding to you.

## 2019-09-17 NOTE — Telephone Encounter (Signed)
I called and spoke with [facility] who stated [phone]  is good through 11/18/2019. Code for [phone] expires on 09/19/19 I asked to extend the authorization. They transferred me to healthhelp [phone]. I updated auth to be 11/30-12-30 ref#[id number] 09/17/19. DW

## 2019-09-17 NOTE — Telephone Encounter (Signed)
Santiago Glad @[facility]  has called asking that the MRI coordinator(s) call them re: an extended date that is needed for the order re: pt's appointment which is set for 11-30(the order/authorization expires before pt's appointment).  Santiago Glad is asking they be called by 3:00 today, please ask for Anderson Malta @ [phone] when calling back

## 2019-09-17 NOTE — Telephone Encounter (Signed)
I called and told Anderson Malta we had updated auth. DW

## 2019-09-22 ENCOUNTER — Ambulatory Visit
Admission: RE | Admit: 2019-09-22 | Discharge: 2019-09-22 | Disposition: A | Discharge status: Home / Self Care | Payer: Medicare HMO | Source: Ambulatory Visit | Attending: Adult Health | Admitting: Adult Health

## 2019-09-22 ENCOUNTER — Other Ambulatory Visit: Payer: Self-pay

## 2019-09-22 DIAGNOSIS — G35 Multiple sclerosis: Secondary | ICD-10-CM

## 2019-09-22 MED ORDER — GADOBENATE DIMEGLUMINE 529 MG/ML IV SOLN
20.0000 mL | Freq: Once | INTRAVENOUS | Status: AC | PRN
  Administered 2019-09-22: 13:00:00 20 mL via INTRAVENOUS

## 2019-09-23 ENCOUNTER — Telehealth: Payer: Self-pay | Admitting: *Deleted

## 2019-09-23 NOTE — Telephone Encounter (Signed)
I spoke to pt and relayed that both her MRI brain and cervical spine were unchanged from her previous scans.  She verbalized understanding.

## 2019-09-23 NOTE — Telephone Encounter (Signed)
-----   Message from Ward Givens, NP sent at 09/22/2019  4:24 PM EST ----- No change compared to previous scan

## 2019-09-23 NOTE — Telephone Encounter (Signed)
-----   Message from Ward Givens, NP sent at 09/22/2019  4:23 PM EST ----- No change from scan in 2018. Please call patient.

## 2019-10-28 ENCOUNTER — Other Ambulatory Visit: Payer: Self-pay | Admitting: Neurology

## 2019-11-04 ENCOUNTER — Telehealth: Payer: Self-pay

## 2019-11-04 NOTE — Telephone Encounter (Signed)
PA for Plegridy was initiated on [employer]MM. Key: [id number]. Received a instant approval. Approved through 10-22-2020. Once I receive a copy of the approval letter it will be faxed to the patients pharmacy.

## 2019-11-05 NOTE — Telephone Encounter (Signed)
Approval letter has been faxed to patient's pharmacy. Confirmation fax has been received.

## 2019-12-02 DIAGNOSIS — Z7189 Other specified counseling: Secondary | ICD-10-CM | POA: Diagnosis not present

## 2019-12-02 DIAGNOSIS — Z299 Encounter for prophylactic measures, unspecified: Secondary | ICD-10-CM | POA: Diagnosis not present

## 2019-12-02 DIAGNOSIS — F338 Other recurrent depressive disorders: Secondary | ICD-10-CM | POA: Diagnosis not present

## 2019-12-02 DIAGNOSIS — Z1339 Encounter for screening examination for other mental health and behavioral disorders: Secondary | ICD-10-CM | POA: Diagnosis not present

## 2019-12-02 DIAGNOSIS — Z79899 Other long term (current) drug therapy: Secondary | ICD-10-CM | POA: Diagnosis not present

## 2019-12-02 DIAGNOSIS — Z6841 Body Mass Index (BMI) 40.0 and over, adult: Secondary | ICD-10-CM | POA: Diagnosis not present

## 2019-12-02 DIAGNOSIS — I10 Essential (primary) hypertension: Secondary | ICD-10-CM | POA: Diagnosis not present

## 2019-12-02 DIAGNOSIS — Z0000 Encounter for general adult medical examination without abnormal findings: Secondary | ICD-10-CM | POA: Diagnosis not present

## 2019-12-02 DIAGNOSIS — Z1331 Encounter for screening for depression: Secondary | ICD-10-CM | POA: Diagnosis not present

## 2019-12-12 DIAGNOSIS — R339 Retention of urine, unspecified: Secondary | ICD-10-CM | POA: Diagnosis not present

## 2019-12-25 ENCOUNTER — Other Ambulatory Visit: Payer: Self-pay | Admitting: Neurology

## 2019-12-25 ENCOUNTER — Telehealth: Payer: Self-pay | Admitting: Adult Health

## 2019-12-25 NOTE — Telephone Encounter (Signed)
I called pt and she is having what she thinks is an MS exacerbation.  Similar presentation when has had one previously.  About 10 days ago, bil leg weakness, (even her good leg, also her arms).  No incontinence, or numbness/tingling.  On plegridy.  Please advise. Last seen 08-20-19 upcoming appt 01-2020.   Has had prednisone in the past.

## 2019-12-25 NOTE — Telephone Encounter (Signed)
Pt called stating that her legs are giving out and is wanting to know if she can get Prednisone called in for her. Please advise.

## 2019-12-26 MED ORDER — SODIUM CHLORIDE 0.9 % IV SOLN: INTRAVENOUS | 0 refills | Status: DC

## 2019-12-26 NOTE — Telephone Encounter (Signed)
Please arrange for steroid booster iv the next week, if we can help her getting it locally, she may be able to go to [facility] infusion center?  1000 mg Solumedrol  iv for 3 days. Keep next appointment on the books, please.

## 2019-12-29 NOTE — Telephone Encounter (Signed)
I called pt and relayed that phone message when sent to Dr. Brett Fairy and she address that would like her to come in here intrafusion for solumedrol 1000mg  IV x 3 days.  Pt was ok to do. Orders given to intrafusion and will call pt with time.

## 2019-12-30 DIAGNOSIS — G35 Multiple sclerosis: Secondary | ICD-10-CM | POA: Diagnosis not present

## 2019-12-31 DIAGNOSIS — G35 Multiple sclerosis: Secondary | ICD-10-CM | POA: Diagnosis not present

## 2020-01-01 DIAGNOSIS — G35 Multiple sclerosis: Secondary | ICD-10-CM | POA: Diagnosis not present

## 2020-02-19 ENCOUNTER — Encounter: Payer: Self-pay | Admitting: Adult Health

## 2020-02-19 ENCOUNTER — Other Ambulatory Visit: Payer: Self-pay

## 2020-02-19 ENCOUNTER — Ambulatory Visit (INDEPENDENT_AMBULATORY_CARE_PROVIDER_SITE_OTHER): Payer: Medicare HMO | Admitting: Adult Health

## 2020-02-19 VITALS — BP 125/79 | HR 78 | Temp 97.8°F | Ht 68.0 in | Wt 267.4 lb

## 2020-02-19 DIAGNOSIS — G35 Multiple sclerosis: Secondary | ICD-10-CM

## 2020-02-19 DIAGNOSIS — G2581 Restless legs syndrome: Secondary | ICD-10-CM

## 2020-02-19 NOTE — Progress Notes (Signed)
PATIENT: [name] DOB: January 13, 1981  REASON FOR VISIT: follow up HISTORY FROM: patient  HISTORY OF PRESENT ILLNESS: Today 02/19/20:  [name] is a 39 year old female with a history of multiple sclerosis and restless leg syndrome.  She returns today for follow-up.  She remains on Plegridy.  She did have a potential exacerbation in March.  She received IV Solu-Medrol with good benefit.  Reports that her symptoms have been stable since then.  She denies any new numbness or weakness.  Denies any changes with her gait or balance.  She continues to have the self cath.  Remains on Mirapex for restless leg.  She returns today for an evaluation.  HISTORY  08/20/19:  [name] is a 39 year old female with a history of multiple sclerosis and restless leg syndrome.  She returns today for follow-up.  She continues on Plegridy.  She continues to tolerate this medication well.  She denies any new numbness or weakness.  Denies any changes with her gait or balance.  She is now having to self cath up to 3 times a day.  She has not seen Dr. Zigmund Daniel in 2 years.  She states that she keeps a urinary tract infection despite being on preventative medication.  No change in her vision.  Her last MRI of the brain and cervical spine was in 2018.  She reports that Mirapex has been beneficial for restless leg syndrome.  She continues to take 0.25 mg at dinnertime and at bedtime.  She returns today for evaluation.  REVIEW OF SYSTEMS: Out of a complete 14 system review of symptoms, the patient complains only of the following symptoms, and all other reviewed systems are negative.  See HPI  ALLERGIES: Allergies  Allergen Reactions  . Diclofenac Anaphylaxis  . Zofran [Ondansetron Hcl] Other (See Comments)    Prolonged QT - unable to have zofran per report  . Propoxyphene N-Acetaminophen Rash    HOME MEDICATIONS: Outpatient Medications Prior to Visit  Medication Sig Dispense Refill  . ALPRAZolam (XANAX) 1  MG tablet TAKE ONE-HALF TABLET BY MOUTH TWICE A DAY AS NEEDED FOR ANXIETY 30 tablet 5  . chlorthalidone (HYGROTON) 25 MG tablet 25 mg daily.     Marland Kitchen PLEGRIDY 125 MCG/0.5ML SOPN INJECT 125 MCG (1 PEN) SUBCUTANEOUSLY EVERY 14 DAYS 6 pen 11  . POTASSIUM CHLORIDE PO Take by mouth. Takes one tablet daily.    . pramipexole (MIRAPEX) 0.25 MG tablet TAKE ONE TABLET BY MOUTH AT DINNER AND ONE TABLET AT BEDTIME 180 tablet 1  . sertraline (ZOLOFT) 25 MG tablet TAKE ONE TABLET (25MG  TOTAL) BY MOUTH ATBEDTIME 90 tablet 1  . vitamin C (ASCORBIC ACID) 500 MG tablet Take 1 tablet by mouth daily.    . methylPREDNISolone sodium succinate in sodium chloride 0.9 % 50 mL 1000 mg iv for each of 3 days . 1000 mg 0   No facility-administered medications prior to visit.    PAST MEDICAL HISTORY: Past Medical History:  Diagnosis Date  . Anxiety   . Depression   . Enlarged heart   . Fibroid 04/24/2016  . GERD (gastroesophageal reflux disease)   . Leaky heart valve   . Menorrhagia with irregular cycle 04/13/2016  . Multiple sclerosis ([place])    Dr Lonzo Cloud  . Multiple sclerosis exacerbation ([place])   . Numbness in right leg 02/13,12/12  . Obesity (BMI 35.0-39.9 without comorbidity)   . Other malaise and fatigue 10/20/2013  . Ovarian cyst 04/24/2016  . Regurgitation  cardiac valve. echo 2013 for first eval GILENYA,mod MR,TR    PAST SURGICAL HISTORY: Past Surgical History:  Procedure Laterality Date  . BACK SURGERY  2006  . CESAREAN SECTION  2000  . CHOLECYSTECTOMY  2010  . COLONOSCOPY  09/14/2010   Fields-hyperplastic polyps, int hemorrhoids  . CYST EXCISION  2006   pilonoidal  . DILITATION & CURRETTAGE/HYSTROSCOPY WITH NOVASURE ABLATION N/A 07/21/2016   Procedure: DILATATION & CURETTAGE/HYSTEROSCOPY WITH NOVASURE ABLATION;  Surgeon: Jonnie Kind, MD;  Location: [facility];  Service: Gynecology;  Laterality: N/A;  Novasure lenght - 5.0 width - 4.1 power - 113 time - 2 min 03 seconds  .  ESOPHAGOGASTRODUODENOSCOPY  08/05/2008   Fields-incomplete Schatzki's ring, distal esophageal erosions/esophagitis(mild), small HH, NEGATIVE h pylori  . LAPAROSCOPIC APPENDECTOMY N/A 03/26/2018   Procedure: APPENDECTOMY LAPAROSCOPIC;  Surgeon: Aviva Signs, MD;  Location: [facility];  Service: General;  Laterality: N/A;  . LAPAROSCOPIC BILATERAL SALPINGECTOMY Bilateral 07/21/2016   Procedure: LAPAROSCOPIC RIGHT SALPINGECTOMY AND LEFT OOPHECTOMY;  Surgeon: Jonnie Kind, MD;  Location: [facility];  Service: Gynecology;  Laterality: Bilateral;  . LUMBAR DISC SURGERY    . US ECHOCARDIOGRAPHY  03/19/2012   RV mildly dilated,RA mod. dilated,strial septum aneurysmal,Mod. MR & TR    FAMILY HISTORY: Family History  Problem Relation Age of Onset  . Other Mother        lost leg, has trouble swallowing  . Thyroid nodules Mother   . Heart attack Father   . Coronary artery disease Father   . Diabetes Father   . Rheum arthritis Father   . Colon cancer Paternal Uncle 92  . Colon cancer Paternal Grandmother 57  . Diabetes Paternal Grandmother   . Cancer Paternal Grandmother        breast,lung  . Cancer Maternal Grandmother 53       kind unknown  . Diabetes Maternal Grandmother   . Myasthenia gravis Maternal Grandfather   . Other Sister        tumor on ovary  . ADD / ADHD Son   . Heart attack Paternal Grandfather   . Multiple sclerosis Other        paternal great aunt    SOCIAL HISTORY: Social History   Socioeconomic History  . Marital status: Married    Spouse name: Divorced   . Number of children: 1  . Years of education: college  . Highest education level: Not on file  Occupational History  . Occupation: disabled  Tobacco Use  . Smoking status: Former Smoker    Packs/day: 1.00    Years: 20.00    Pack years: 20.00    Types: Cigarettes    Quit date: 02/15/2012    Years since quitting: 8.0  . Smokeless tobacco: Never Used  Substance and Sexual Activity  . Alcohol use: No     Alcohol/week: 0.0 standard drinks  . Drug use: No  . Sexual activity: Never    Birth control/protection: None  Other Topics Concern  . Not on file  Social History Narrative   Patient is divorced and lives at home and he son lives with her.   Patient has one child.   Patient is disabled.   Patient has some college education.   Patient is right-handed.   Patient drinks 3 servings of caffeine daily      Social Determinants of Health   Financial Resource Strain:   . Difficulty of Paying Living Expenses:   Food Insecurity:   . Worried About Running  Out of Food in the Last Year:   . [place] in the Last Year:   Transportation Needs:   . Lack of Transportation (Medical):   Marland Kitchen Lack of Transportation (Non-Medical):   Physical Activity:   . Days of Exercise per Week:   . Minutes of Exercise per Session:   Stress:   . Feeling of Stress :   Social Connections:   . Frequency of Communication with Friends and Family:   . Frequency of Social Gatherings with Friends and Family:   . Attends Religious Services:   . Active Member of Clubs or Organizations:   . Attends Archivist Meetings:   Marland Kitchen Marital Status:   Intimate Partner Violence:   . Fear of Current or Ex-Partner:   . Emotionally Abused:   Marland Kitchen Physically Abused:   . Sexually Abused:       PHYSICAL EXAM  Vitals:   02/19/20 1333  BP: 125/79  Pulse: 78  Temp: 97.8 F (36.6 C)  Weight: 267 lb 6.4 oz (121.3 kg)  Height: 5\' 8"  (1.727 m)   Body mass index is 40.66 kg/m.  Generalized: Well developed, in no acute distress   Neurological examination  Mentation: Alert oriented to time, place, history taking. Follows all commands speech and language fluent Cranial nerve II-XII: . Extraocular movements were full, visual field were full on confrontational test. . Head turning and shoulder shrug  were normal and symmetric. Motor: The motor testing reveals 5 over 5 strength of all 4 extremities. Good symmetric motor  tone is noted throughout.  Sensory: Sensory testing is intact to soft touch on all 4 extremities. No evidence of extinction is noted.  Coordination: Cerebellar testing reveals good finger-nose-finger and heel-to-shin bilaterally.  Gait and station: Patient has a slight limp when ambulating. Reflexes: Deep tendon reflexes are symmetric and normal bilaterally.   DIAGNOSTIC DATA (LABS, IMAGING, TESTING) - I reviewed patient records, labs, notes, testing and imaging myself where available.  Lab Results  Component Value Date   WBC 10.7 08/20/2019   HGB 13.7 08/20/2019   HCT 40.9 08/20/2019   MCV 85 08/20/2019   PLT 339 08/20/2019      Component Value Date/Time   NA 141 08/20/2019 1356   K 4.8 08/20/2019 1356   K 4.2 02/28/2012 1113   CL 107 (H) 08/20/2019 1356   CL 107 02/28/2012 1113   CO2 22 08/20/2019 1356   CO2 23 02/28/2012 1113   GLUCOSE 83 08/20/2019 1356   GLUCOSE 125 (H) 03/27/2018 0456   BUN 10 08/20/2019 1356   CREATININE 0.77 08/20/2019 1356   CREATININE 0.73 02/28/2012 1113   CALCIUM 9.9 08/20/2019 1356   CALCIUM 9.5 02/28/2012 1113   PROT 7.1 08/20/2019 1356   PROT 6.4 02/28/2012 1113   ALBUMIN 4.2 08/20/2019 1356   AST 12 08/20/2019 1356   AST 14 02/28/2012 1113   ALT 14 08/20/2019 1356   ALKPHOS 62 08/20/2019 1356   ALKPHOS 49 02/28/2012 1113   BILITOT 0.4 08/20/2019 1356   GFRNONAA 97 08/20/2019 1356   GFRAA 112 08/20/2019 1356   Lab Results  Component Value Date   CHOL 110 12/09/2014   HDL 47 12/09/2014   LDLCALC 50 12/09/2014   TRIG 64 12/09/2014   CHOLHDL 2.3 12/09/2014   No results found for: HGBA1C No results found for: VITAMINB12 Lab Results  Component Value Date   TSH 3.700 12/09/2014      ASSESSMENT AND PLAN 39 y.o. year old female  has a past medical history of Anxiety, Depression, Enlarged heart, Fibroid (04/24/2016), GERD (gastroesophageal reflux disease), Leaky heart valve, Menorrhagia with irregular cycle (04/13/2016), Multiple  sclerosis ([place]), Multiple sclerosis exacerbation ([place]), Numbness in right leg (02/13,12/12), Obesity (BMI 35.0-39.9 without comorbidity), Other malaise and fatigue (10/20/2013), Ovarian cyst (04/24/2016), and Regurgitation. here with:  1.  Multiple sclerosis  -Continue Plegridy -Blood work today  2.  Restless leg syndrome -Continue Mirapex  Advised if symptoms worsen or she develops new symptoms she should let us know follow-up in 6 months or sooner if needed   I spent 25 minutes of face-to-face and non-face-to-face time with patient.  This included previsit chart review, lab review, study review, order entry, electronic health record documentation, patient education.  Ward Givens, MSN, NP-C 02/19/2020, 2:02 PM [facility] [address] [place], [place] [address] [phone]

## 2020-02-19 NOTE — Patient Instructions (Signed)
Continue on Plegridy for multiple sclerosis Continue Mirapex for restless leg syndrome Blood work today If your symptoms worsen or you develop new symptoms please let us know.

## 2020-02-20 DIAGNOSIS — M419 Scoliosis, unspecified: Secondary | ICD-10-CM | POA: Diagnosis not present

## 2020-02-20 DIAGNOSIS — I10 Essential (primary) hypertension: Secondary | ICD-10-CM | POA: Diagnosis not present

## 2020-02-20 LAB — COMPREHENSIVE METABOLIC PANEL
ALT: 16 IU/L (ref 0–32)
AST: 14 IU/L (ref 0–40)
Albumin/Globulin Ratio: 2.3 — ABNORMAL HIGH (ref 1.2–2.2)
Albumin: 4.3 g/dL (ref 3.8–4.8)
Alkaline Phosphatase: 60 IU/L (ref 39–117)
BUN/Creatinine Ratio: 11 (ref 9–23)
BUN: 8 mg/dL (ref 6–24)
Bilirubin Total: 0.4 mg/dL (ref 0.0–1.2)
CO2: 24 mmol/L (ref 20–29)
Calcium: 9.3 mg/dL (ref 8.7–10.2)
Chloride: 103 mmol/L (ref 96–106)
Creatinine, Ser: 0.73 mg/dL (ref 0.57–1.00)
GFR calc Af Amer: 119 mL/min/{1.73_m2} (ref >59)
GFR calc non Af Amer: 103 mL/min/{1.73_m2} (ref >59)
Globulin, Total: 1.9 g/dL (ref 1.5–4.5)
Glucose: 77 mg/dL (ref 65–99)
Potassium: 4.3 mmol/L (ref 3.5–5.2)
Sodium: 142 mmol/L (ref 134–144)
Total Protein: 6.2 g/dL (ref 6.0–8.5)

## 2020-02-20 LAB — CBC WITH DIFFERENTIAL/PLATELET
Basophils Absolute: 0.1 10*3/uL (ref 0.0–0.2)
Basos: 1 %
EOS (ABSOLUTE): 0.4 10*3/uL (ref 0.0–0.4)
Eos: 4 %
Hematocrit: 40.9 % (ref 34.0–46.6)
Hemoglobin: 14.5 g/dL (ref 11.1–15.9)
Immature Grans (Abs): 0 10*3/uL (ref 0.0–0.1)
Immature Granulocytes: 0 %
Lymphocytes Absolute: 2.4 10*3/uL (ref 0.7–3.1)
Lymphs: 24 %
MCH: 30.9 pg (ref 26.6–33.0)
MCHC: 35.5 g/dL (ref 31.5–35.7)
MCV: 87 fL (ref 79–97)
Monocytes Absolute: 0.5 10*3/uL (ref 0.1–0.9)
Monocytes: 5 %
Neutrophils Absolute: 6.8 10*3/uL (ref 1.4–7.0)
Neutrophils: 66 %
Platelets: 303 10*3/uL (ref 150–450)
RBC: 4.7 x10E6/uL (ref 3.77–5.28)
RDW: 12.9 % (ref 11.7–15.4)
WBC: 10.1 10*3/uL (ref 3.4–10.8)

## 2020-02-23 ENCOUNTER — Telehealth: Payer: Self-pay

## 2020-02-23 NOTE — Telephone Encounter (Signed)
Pt verified by name and DOB, results given per provider, pt voiced understanding all question answered. 

## 2020-04-30 ENCOUNTER — Other Ambulatory Visit: Payer: Self-pay | Admitting: Neurology

## 2020-05-03 ENCOUNTER — Other Ambulatory Visit: Payer: Self-pay | Admitting: Neurology

## 2020-05-03 MED ORDER — ALPRAZOLAM 1 MG PO TABS: ORAL_TABLET | ORAL | 5 refills | Status: DC

## 2020-05-21 DIAGNOSIS — I10 Essential (primary) hypertension: Secondary | ICD-10-CM | POA: Diagnosis not present

## 2020-05-21 DIAGNOSIS — D509 Iron deficiency anemia, unspecified: Secondary | ICD-10-CM | POA: Diagnosis not present

## 2020-05-28 ENCOUNTER — Other Ambulatory Visit: Payer: Self-pay | Admitting: Neurology

## 2020-05-28 DIAGNOSIS — L568 Other specified acute skin changes due to ultraviolet radiation: Secondary | ICD-10-CM

## 2020-05-28 DIAGNOSIS — H81312 Aural vertigo, left ear: Secondary | ICD-10-CM

## 2020-05-28 DIAGNOSIS — G35 Multiple sclerosis: Secondary | ICD-10-CM

## 2020-05-28 DIAGNOSIS — G35D Multiple sclerosis, unspecified: Secondary | ICD-10-CM

## 2020-06-01 ENCOUNTER — Other Ambulatory Visit: Payer: Self-pay | Admitting: Neurology

## 2020-06-01 DIAGNOSIS — G35 Multiple sclerosis: Secondary | ICD-10-CM

## 2020-06-01 DIAGNOSIS — H81312 Aural vertigo, left ear: Secondary | ICD-10-CM

## 2020-06-01 DIAGNOSIS — L568 Other specified acute skin changes due to ultraviolet radiation: Secondary | ICD-10-CM

## 2020-06-01 MED ORDER — PLEGRIDY 125 MCG/0.5ML ~~LOC~~ SOPN: PEN_INJECTOR | SUBCUTANEOUS | 11 refills | Status: DC

## 2020-06-22 DIAGNOSIS — D509 Iron deficiency anemia, unspecified: Secondary | ICD-10-CM | POA: Diagnosis not present

## 2020-06-22 DIAGNOSIS — I10 Essential (primary) hypertension: Secondary | ICD-10-CM | POA: Diagnosis not present

## 2020-07-22 DIAGNOSIS — D509 Iron deficiency anemia, unspecified: Secondary | ICD-10-CM | POA: Diagnosis not present

## 2020-07-22 DIAGNOSIS — I10 Essential (primary) hypertension: Secondary | ICD-10-CM | POA: Diagnosis not present

## 2020-08-20 DIAGNOSIS — I10 Essential (primary) hypertension: Secondary | ICD-10-CM | POA: Diagnosis not present

## 2020-08-20 DIAGNOSIS — D509 Iron deficiency anemia, unspecified: Secondary | ICD-10-CM | POA: Diagnosis not present

## 2020-08-23 ENCOUNTER — Ambulatory Visit: Payer: Medicare HMO | Admitting: Adult Health

## 2020-08-25 DIAGNOSIS — R339 Retention of urine, unspecified: Secondary | ICD-10-CM | POA: Diagnosis not present

## 2020-09-21 DIAGNOSIS — I10 Essential (primary) hypertension: Secondary | ICD-10-CM | POA: Diagnosis not present

## 2020-09-21 DIAGNOSIS — D509 Iron deficiency anemia, unspecified: Secondary | ICD-10-CM | POA: Diagnosis not present

## 2020-10-19 ENCOUNTER — Ambulatory Visit: Payer: Medicare HMO | Admitting: Adult Health

## 2020-10-26 ENCOUNTER — Telehealth: Payer: Self-pay

## 2020-10-26 NOTE — Telephone Encounter (Signed)
Key: [id number]) Plegridy 125MCG/0.5ML pen-injectors  Your information has been submitted to [facility]. [employer] will review the request and will issue a decision, typically within 3-7 days from your submission. You can check the updated outcome later by reopening this request.  If [employer] has not responded in 3-7 days or if you have any questions about your ePA request, please contact [employer] at [phone]. If you think there may be a problem with your PA request, use our live chat feature at the bottom right.  For [place] requests, please call [phone].  Key

## 2020-10-28 NOTE — Telephone Encounter (Signed)
Additional info faxed to [facility]

## 2020-11-11 NOTE — Telephone Encounter (Signed)
The prior authorization for the medication has been denied by the insurance, reason not given. LVM for pt to give consent for appeal

## 2020-11-15 NOTE — Telephone Encounter (Signed)
Received the denial for Pt's Plegridy 125 mcg/0.5 mL. Insurance claims PA has been denied based on the fact that Betaseron SQ kit has not been tried and failed.  I did locate a note from [place], RN on 01/15/14 that betaseron has been tried but could not locate it past meds or office note.

## 2020-11-23 ENCOUNTER — Ambulatory Visit (INDEPENDENT_AMBULATORY_CARE_PROVIDER_SITE_OTHER): Payer: Medicare HMO | Admitting: Adult Health

## 2020-11-23 ENCOUNTER — Other Ambulatory Visit: Payer: Self-pay

## 2020-11-23 ENCOUNTER — Encounter: Payer: Self-pay | Admitting: Adult Health

## 2020-11-23 VITALS — BP 141/89 | HR 69 | Ht 68.0 in | Wt 250.0 lb

## 2020-11-23 DIAGNOSIS — G35 Multiple sclerosis: Secondary | ICD-10-CM | POA: Diagnosis not present

## 2020-11-23 DIAGNOSIS — L568 Other specified acute skin changes due to ultraviolet radiation: Secondary | ICD-10-CM

## 2020-11-23 DIAGNOSIS — G2581 Restless legs syndrome: Secondary | ICD-10-CM

## 2020-11-23 MED ORDER — PLEGRIDY 125 MCG/0.5ML ~~LOC~~ SOPN: PEN_INJECTOR | SUBCUTANEOUS | 11 refills | Status: DC

## 2020-11-23 MED ORDER — PRAMIPEXOLE DIHYDROCHLORIDE 0.25 MG PO TABS: ORAL_TABLET | ORAL | 3 refills | Status: DC

## 2020-11-23 MED ORDER — SERTRALINE HCL 25 MG PO TABS: ORAL_TABLET | ORAL | 3 refills | Status: DC

## 2020-11-23 NOTE — Patient Instructions (Signed)
Your Plan:  Continue Plegridy Continue Mirapex, zoloft and xanax MRI brain and spine If your symptoms worsen or you develop new symptoms please let us know.    Thank you for coming to see [place] at [facility]. I hope we have been able to provide you high quality care today.  You may receive a patient satisfaction survey over the next few weeks. We would appreciate your feedback and comments so that we may continue to improve ourselves and the health of our patients.

## 2020-11-23 NOTE — Progress Notes (Signed)
PATIENT: [name] DOB: 1981/08/10  REASON FOR VISIT: follow up HISTORY FROM: patient  HISTORY OF PRESENT ILLNESS: Today 11/23/20:  [name] is a 40 year old female with a history of multiple sclerosis and restless leg syndrome.  She returns today for follow-up.  She remains on Plegridy.patient reports that since 2023-08-02 she has noticed more weakness in the right leg.  She states that she has been under a lot of stress as her father passed away in 2023-08-02 and she has had to move.  No changes with the bowels or bladder-continues to self cath.  She plans to follow-up with her urologist to discuss any medication options.  Reports that she has had minimal changes in her gait mainly due to increased weakness in the right leg..  Denies any changes in her vision.  Remains on Mirapex for restless leg.  She returns today for an evaluation.    Imaging 09/22/2019:  MRI cervical spine with and without contrast: IMPRESSION: This MRI of the cervical spine with and without contrast shows the following: 1.   Multiple T2 hyperintense foci within the spinal cord as detailed above consistent with chronic demyelinating plaque associated with multiple sclerosis.  None of the foci enhanced.  Compared to the MRI dated 02/15/2017, there is no interval change. 2.   There is a normal enhancement pattern.  MRI brain with and without contrast:  IMPRESSION: This MRI of the brain with and without contrast shows the following: 1.   There are multiple T2/FLAIR hyperintense foci in the hemispheres, brainstem and left middle cerebellar peduncle in a pattern and configuration consistent with chronic demyelinating plaque associated with multiple sclerosis.  None of the foci appears to be acute and they do not enhance.  Compared to the MRI dated 02/15/2017, there is no interval change. 2.   There are no acute findings and there is a normal enhancement pattern.  HISTORY 02/19/20: [name] is a 40 year old female with a  history of multiple sclerosis and restless leg syndrome.  She returns today for follow-up.  She remains on Plegridy.  She did have a potential exacerbation in March.  She received IV Solu-Medrol with good benefit.  Reports that her symptoms have been stable since then.  She denies any new numbness or weakness.  Denies any changes with her gait or balance.  She continues to have the self cath.  Remains on Mirapex for restless leg.  She returns today for an evaluation.  HISTORY  08/20/19:  [name] is a 40 year old female with a history of multiple sclerosis and restless leg syndrome.  She returns today for follow-up.  She continues on Plegridy.  She continues to tolerate this medication well.  She denies any new numbness or weakness.  Denies any changes with her gait or balance.  She is now having to self cath up to 3 times a day.  She has not seen Dr. Zigmund Daniel in 2 years.  She states that she keeps a urinary tract infection despite being on preventative medication.  No change in her vision.  Her last MRI of the brain and cervical spine was in 2018.  She reports that Mirapex has been beneficial for restless leg syndrome.  She continues to take 0.25 mg at dinnertime and at bedtime.  She returns today for evaluation.  REVIEW OF SYSTEMS: Out of a complete 14 system review of symptoms, the patient complains only of the following symptoms, and all other reviewed systems are negative.  See HPI  ALLERGIES: Allergies  Allergen Reactions  . Diclofenac Anaphylaxis  . Zofran [Ondansetron Hcl] Other (See Comments)    Prolonged QT - unable to have zofran per report  . Propoxyphene N-Acetaminophen Rash    HOME MEDICATIONS: Outpatient Medications Prior to Visit  Medication Sig Dispense Refill  . ALPRAZolam (XANAX) 1 MG tablet TAKE ONE-HALF TABLET BY MOUTH TWICE A DAY AS NEEDED FOR ANXIETY 30 tablet 5  . chlorthalidone (HYGROTON) 25 MG tablet 25 mg daily.     . Peginterferon Beta-1a (PLEGRIDY) 125  MCG/0.5ML SOPN INJECT 125 MCG (1 PEN) SUBCUTANEOUSLY EVERY 14 DAYS 3 mL 11  . POTASSIUM CHLORIDE PO Take by mouth. Takes one tablet daily.    . pramipexole (MIRAPEX) 0.25 MG tablet TAKE ONE TABLET BY MOUTH AT DINNER AND ONE TABLET AT BEDTIME 180 tablet 1  . sertraline (ZOLOFT) 25 MG tablet TAKE ONE TABLET (25MG  TOTAL) BY MOUTH ATBEDTIME 90 tablet 1  . vitamin C (ASCORBIC ACID) 500 MG tablet Take 1 tablet by mouth daily.     No facility-administered medications prior to visit.    PAST MEDICAL HISTORY: Past Medical History:  Diagnosis Date  . Anxiety   . Depression   . Enlarged heart   . Fibroid 04/24/2016  . GERD (gastroesophageal reflux disease)   . Leaky heart valve   . Menorrhagia with irregular cycle 04/13/2016  . Multiple sclerosis ([place])    Dr Lonzo Cloud  . Multiple sclerosis exacerbation ([place])   . Numbness in right leg 02/13,12/12  . Obesity (BMI 35.0-39.9 without comorbidity)   . Other malaise and fatigue 10/20/2013  . Ovarian cyst 04/24/2016  . Regurgitation    cardiac valve. echo 2013 for first eval GILENYA,mod MR,TR    PAST SURGICAL HISTORY: Past Surgical History:  Procedure Laterality Date  . BACK SURGERY  2006  . CESAREAN SECTION  2000  . CHOLECYSTECTOMY  2010  . COLONOSCOPY  09/14/2010   Fields-hyperplastic polyps, int hemorrhoids  . CYST EXCISION  2006   pilonoidal  . DILITATION & CURRETTAGE/HYSTROSCOPY WITH NOVASURE ABLATION N/A 07/21/2016   Procedure: DILATATION & CURETTAGE/HYSTEROSCOPY WITH NOVASURE ABLATION;  Surgeon: Jonnie Kind, MD;  Location: [facility];  Service: Gynecology;  Laterality: N/A;  Novasure lenght - 5.0 width - 4.1 power - 113 time - 2 min 03 seconds  . ESOPHAGOGASTRODUODENOSCOPY  08/05/2008   Fields-incomplete Schatzki's ring, distal esophageal erosions/esophagitis(mild), small HH, NEGATIVE h pylori  . LAPAROSCOPIC APPENDECTOMY N/A 03/26/2018   Procedure: APPENDECTOMY LAPAROSCOPIC;  Surgeon: Aviva Signs, MD;  Location: [facility];  Service:  General;  Laterality: N/A;  . LAPAROSCOPIC BILATERAL SALPINGECTOMY Bilateral 07/21/2016   Procedure: LAPAROSCOPIC RIGHT SALPINGECTOMY AND LEFT OOPHECTOMY;  Surgeon: Jonnie Kind, MD;  Location: [facility];  Service: Gynecology;  Laterality: Bilateral;  . LUMBAR DISC SURGERY    . US ECHOCARDIOGRAPHY  03/19/2012   RV mildly dilated,RA mod. dilated,strial septum aneurysmal,Mod. MR & TR    FAMILY HISTORY: Family History  Problem Relation Age of Onset  . Other Mother        lost leg, has trouble swallowing  . Thyroid nodules Mother   . Heart attack Father   . Coronary artery disease Father   . Diabetes Father   . Rheum arthritis Father   . Colon cancer Paternal Uncle 1  . Colon cancer Paternal Grandmother 13  . Diabetes Paternal Grandmother   . Cancer Paternal Grandmother        breast,lung  . Cancer Maternal Grandmother 73  kind unknown  . Diabetes Maternal Grandmother   . Myasthenia gravis Maternal Grandfather   . Other Sister        tumor on ovary  . ADD / ADHD Son   . Heart attack Paternal Grandfather   . Multiple sclerosis Other        paternal great aunt    SOCIAL HISTORY: Social History   Socioeconomic History  . Marital status: Married    Spouse name: Divorced   . Number of children: 1  . Years of education: college  . Highest education level: Not on file  Occupational History  . Occupation: disabled  Tobacco Use  . Smoking status: Former Smoker    Packs/day: 1.00    Years: 20.00    Pack years: 20.00    Types: Cigarettes    Quit date: 02/15/2012    Years since quitting: 8.7  . Smokeless tobacco: Never Used  Substance and Sexual Activity  . Alcohol use: No    Alcohol/week: 0.0 standard drinks  . Drug use: No  . Sexual activity: Never    Birth control/protection: None  Other Topics Concern  . Not on file  Social History Narrative   Patient is divorced and lives at home and he son lives with her.   Patient has one child.   Patient is disabled.    Patient has some college education.   Patient is right-handed.   Patient drinks 3 servings of caffeine daily      Social Determinants of Health   Financial Resource Strain: Not on file  Food Insecurity: Not on file  Transportation Needs: Not on file  Physical Activity: Not on file  Stress: Not on file  Social Connections: Not on file  Intimate Partner Violence: Not on file      PHYSICAL EXAM  Vitals:   11/23/20 1430  BP: (!) 141/89  Pulse: 69  Weight: 250 lb (113.4 kg)  Height: 5\' 8"  (1.727 m)   Body mass index is 38.01 kg/m.  Generalized: Well developed, in no acute distress   Neurological examination  Mentation: Alert oriented to time, place, history taking. Follows all commands speech and language fluent Cranial nerve II-XII: . Extraocular movements were full, visual field were full on confrontational test. . Head turning and shoulder shrug  were normal and symmetric. Motor: The motor testing reveals 5 over 5 strength of all 4 extremities with the exception of slightly weaker in the right lower extremity.  Good symmetric motor tone is noted throughout.  Sensory: Sensory testing is intact to soft touch on all 4 extremities. No evidence of extinction is noted.  Coordination: Cerebellar testing reveals good finger-nose-finger and heel-to-shin bilaterally.  Gait and station: Patient has a slight limp when ambulating. Reflexes: Deep tendon reflexes are symmetric and normal bilaterally.   DIAGNOSTIC DATA (LABS, IMAGING, TESTING) - I reviewed patient records, labs, notes, testing and imaging myself where available.  Lab Results  Component Value Date   WBC 10.1 02/19/2020   HGB 14.5 02/19/2020   HCT 40.9 02/19/2020   MCV 87 02/19/2020   PLT 303 02/19/2020      Component Value Date/Time   NA 142 02/19/2020 1435   K 4.3 02/19/2020 1435   K 4.2 02/28/2012 1113   CL 103 02/19/2020 1435   CL 107 02/28/2012 1113   CO2 24 02/19/2020 1435   CO2 23 02/28/2012 1113    GLUCOSE 77 02/19/2020 1435   GLUCOSE 125 (H) 03/27/2018 0456   BUN 8 02/19/2020  1435   CREATININE 0.73 02/19/2020 1435   CREATININE 0.73 02/28/2012 1113   CALCIUM 9.3 02/19/2020 1435   CALCIUM 9.5 02/28/2012 1113   PROT 6.2 02/19/2020 1435   PROT 6.4 02/28/2012 1113   ALBUMIN 4.3 02/19/2020 1435   AST 14 02/19/2020 1435   AST 14 02/28/2012 1113   ALT 16 02/19/2020 1435   ALKPHOS 60 02/19/2020 1435   ALKPHOS 49 02/28/2012 1113   BILITOT 0.4 02/19/2020 1435   GFRNONAA 103 02/19/2020 1435   GFRAA 119 02/19/2020 1435   Lab Results  Component Value Date   CHOL 110 12/09/2014   HDL 47 12/09/2014   LDLCALC 50 12/09/2014   TRIG 64 12/09/2014   CHOLHDL 2.3 12/09/2014    Lab Results  Component Value Date   TSH 3.700 12/09/2014      ASSESSMENT AND PLAN 40 y.o. year old female  has a past medical history of Anxiety, Depression, Enlarged heart, Fibroid (04/24/2016), GERD (gastroesophageal reflux disease), Leaky heart valve, Menorrhagia with irregular cycle (04/13/2016), Multiple sclerosis ([place]), Multiple sclerosis exacerbation ([place]), Numbness in right leg (02/13,12/12), Obesity (BMI 35.0-39.9 without comorbidity), Other malaise and fatigue (10/20/2013), Ovarian cyst (04/24/2016), and Regurgitation. here with:  1.  Multiple sclerosis  -Continue Plegridy -Blood work today -Continue Zoloft 25 mg at bedtime -Continue Xanax half a tablet twice a day for anxiety - MRI brain and cervical spine to look for new lesions-- progressive weakness in right leg since october  2.  Restless leg syndrome -Continue Mirapex 0.25 mg at dinner and bedtime  Advised if symptoms worsen or she develops new symptoms she should let us know follow-up in 6 months or sooner if needed   I spent 30 minutes of face-to-face and non-face-to-face time with patient.  This included previsit chart review, lab review, study review, order entry, electronic health record documentation, patient education.  Ward Givens,  MSN, NP-C 11/23/2020, 2:29 PM [facility] [address], [place], [place] [address] [phone]

## 2020-11-23 NOTE — Telephone Encounter (Signed)
Appeal sent via [place] and spoke to [facility] for status of appeal. Still awaiting an approval  (Key: [id number]) Plegridy 125MCG/0.5ML pen-injectors   Form Drug Appeal Form

## 2020-11-24 ENCOUNTER — Telehealth: Payer: Self-pay | Admitting: Adult Health

## 2020-11-24 LAB — COMPREHENSIVE METABOLIC PANEL
ALT: 13 IU/L (ref 0–32)
AST: 11 IU/L (ref 0–40)
Albumin/Globulin Ratio: 1.5 (ref 1.2–2.2)
Albumin: 4.2 g/dL (ref 3.8–4.8)
Alkaline Phosphatase: 64 IU/L (ref 44–121)
BUN/Creatinine Ratio: 13 (ref 9–23)
BUN: 9 mg/dL (ref 6–24)
Bilirubin Total: 0.3 mg/dL (ref 0.0–1.2)
CO2: 26 mmol/L (ref 20–29)
Calcium: 9.7 mg/dL (ref 8.7–10.2)
Chloride: 99 mmol/L (ref 96–106)
Creatinine, Ser: 0.71 mg/dL (ref 0.57–1.00)
GFR calc Af Amer: 122 mL/min/{1.73_m2} (ref >59)
GFR calc non Af Amer: 106 mL/min/{1.73_m2} (ref >59)
Globulin, Total: 2.8 g/dL (ref 1.5–4.5)
Glucose: 88 mg/dL (ref 65–99)
Potassium: 4.2 mmol/L (ref 3.5–5.2)
Sodium: 136 mmol/L (ref 134–144)
Total Protein: 7 g/dL (ref 6.0–8.5)

## 2020-11-24 LAB — CBC WITH DIFFERENTIAL/PLATELET
Basophils Absolute: 0.1 10*3/uL (ref 0.0–0.2)
Basos: 1 %
EOS (ABSOLUTE): 0.3 10*3/uL (ref 0.0–0.4)
Eos: 3 %
Hematocrit: 43.6 % (ref 34.0–46.6)
Hemoglobin: 14.8 g/dL (ref 11.1–15.9)
Immature Grans (Abs): 0 10*3/uL (ref 0.0–0.1)
Immature Granulocytes: 0 %
Lymphocytes Absolute: 2.3 10*3/uL (ref 0.7–3.1)
Lymphs: 21 %
MCH: 28.1 pg (ref 26.6–33.0)
MCHC: 33.9 g/dL (ref 31.5–35.7)
MCV: 83 fL (ref 79–97)
Monocytes Absolute: 0.5 10*3/uL (ref 0.1–0.9)
Monocytes: 4 %
Neutrophils Absolute: 8 10*3/uL — ABNORMAL HIGH (ref 1.4–7.0)
Neutrophils: 71 %
Platelets: 363 10*3/uL (ref 150–450)
RBC: 5.27 x10E6/uL (ref 3.77–5.28)
RDW: 13 % (ref 11.7–15.4)
WBC: 11.1 10*3/uL — ABNORMAL HIGH (ref 3.4–10.8)

## 2020-11-24 MED ORDER — PLEGRIDY 125 MCG/0.5ML ~~LOC~~ SOPN: PEN_INJECTOR | SUBCUTANEOUS | 11 refills | Status: DC

## 2020-11-24 MED ORDER — ALPRAZOLAM 1 MG PO TABS: ORAL_TABLET | ORAL | 5 refills | Status: DC

## 2020-11-24 NOTE — Telephone Encounter (Signed)
Mcarthur Rossetti Josem Kaufmann: [id number] (exp. 11/24/20 to 12/24/20) order sent to GI. They will reach out to the patient to schedule.

## 2020-11-24 NOTE — Telephone Encounter (Signed)
[employer] pending  

## 2020-11-25 NOTE — Telephone Encounter (Signed)
Medication approved until 10/22/2021  Pt notified

## 2020-12-10 ENCOUNTER — Ambulatory Visit
Admission: RE | Admit: 2020-12-10 | Discharge: 2020-12-10 | Disposition: A | Discharge status: Home / Self Care | Payer: Medicare HMO | Source: Ambulatory Visit | Attending: Adult Health | Admitting: Adult Health

## 2020-12-10 DIAGNOSIS — G35 Multiple sclerosis: Secondary | ICD-10-CM

## 2020-12-10 MED ORDER — GADOBENATE DIMEGLUMINE 529 MG/ML IV SOLN
20.0000 mL | Freq: Once | INTRAVENOUS | Status: AC | PRN
  Administered 2020-12-10: 20 mL via INTRAVENOUS

## 2020-12-13 NOTE — Progress Notes (Signed)
Other: Sella is unremarkable.  Mastoid air cells are clear.  IMPRESSION: New subcentimeter focus of enhancement in the right frontal periventricular white matter most consistent with active demyelination. Otherwise stable moderate burden of chronic demyelination. Stable parenchymal volume loss.   Electronically Signed By: Macy Mis M.D. On: 12/10/2020 15:30   There is a new lesion indicating active disease process. Treatment may have to change from plegredy to a more aggressive therapy. We need to obtain [employer] Ab titer, and discuss possible change to iv treatments.  Would the patient like to discuss with Dr. Felecia Shelling  or [place] to discuss her case with him for further guidance?

## 2020-12-14 ENCOUNTER — Telehealth: Payer: Self-pay | Admitting: Neurology

## 2020-12-14 NOTE — Telephone Encounter (Signed)
-----   Message from Larey Seat, MD sent at 12/13/2020  5:37 PM EST ----- Other: Sella is unremarkable.  Mastoid air cells are clear.  IMPRESSION: New subcentimeter focus of enhancement in the right frontal periventricular white matter most consistent with active demyelination. Otherwise stable moderate burden of chronic demyelination. Stable parenchymal volume loss.   Electronically Signed By: Macy Mis M.D. On: 12/10/2020 15:30   There is a new lesion indicating active disease process. Treatment may have to change from plegredy to a more aggressive therapy. We need to obtain JV[employer] Ab titer, and discuss possible change to iv treatments.  Would the patient like to discuss with Dr. Felecia Shelling  or [place] to discuss her case with him for further guidance?

## 2020-12-14 NOTE — Telephone Encounter (Signed)
Called the patient and reviewed the information from the imaging that was completed. Advised that both Dr Brett Fairy and Hedwig Morton, NP had looked over this information and there was a new lesion that was present indicating active disease process.  Again was going to discuss with our MS specialist about alternative treatment options to discuss with the patient.  Advised the patient [name] would like for [place] to schedule a office visit in order to discuss what his recommendations would be.  I was able to offer a opening for this Thursday at 2:30 pm with a check-in of 2 pm.  Patient accepted this time. Pt verbalized understanding. Pt had no questions at this time but was encouraged to call back if questions arise.

## 2020-12-16 ENCOUNTER — Ambulatory Visit: Payer: Self-pay | Admitting: Adult Health

## 2020-12-22 ENCOUNTER — Encounter: Payer: Self-pay | Admitting: Adult Health

## 2020-12-22 ENCOUNTER — Ambulatory Visit (INDEPENDENT_AMBULATORY_CARE_PROVIDER_SITE_OTHER): Payer: Medicare Other | Admitting: Adult Health

## 2020-12-22 VITALS — BP 138/89 | HR 74 | Ht 68.0 in | Wt 250.0 lb

## 2020-12-22 DIAGNOSIS — G35 Multiple sclerosis: Secondary | ICD-10-CM | POA: Diagnosis not present

## 2020-12-22 NOTE — Progress Notes (Signed)
I agree with the assessment and plan as directed by NP on this visit . I was available for consultation.   DOHMEIER,CARMEN, MD  

## 2020-12-22 NOTE — Progress Notes (Signed)
PATIENT: [name] DOB: 02-11-1981  REASON FOR VISIT: follow up HISTORY FROM: patient  HISTORY OF PRESENT ILLNESS: Today 12/22/20:  [name] is a 40 year old female with a history of multiple sclerosis.  She returns today to discuss MRI results.  MRI of the brain did show progression  And an active demyelination.  The patient has been on Plegridy.  She has noticed some weakness in her right leg and right hand.   MRI brain with and without contrast December 10, 2020:  IMPRESSION: New subcentimeter focus of enhancement in the right frontal periventricular white matter most consistent with active demyelination. Otherwise stable moderate burden of chronic demyelination. Stable parenchymal volume loss.  MRI cervical spine with and without contrast December 10, 2020:  IMPRESSION: Likely no substantial change in chronic demyelinating cord lesions. No definite new lesion or abnormal enhancement.   11/23/20: [name] is a 40 year old female with a history of multiple sclerosis and restless leg syndrome.  She returns today for follow-up.  She remains on Plegridy.patient reports that since September 05, 2023 she has noticed more weakness in the right leg.  She states that she has been under a lot of stress as her father passed away in September 05, 2023 and she has had to move.  No changes with the bowels or bladder-continues to self cath.  She plans to follow-up with her urologist to discuss any medication options.  Reports that she has had minimal changes in her gait mainly due to increased weakness in the right leg..  Denies any changes in her vision.  Remains on Mirapex for restless leg.  She returns today for an evaluation.    Imaging 09/22/2019:  MRI cervical spine with and without contrast: IMPRESSION: This MRI of the cervical spine with and without contrast shows the following: 1.   Multiple T2 hyperintense foci within the spinal cord as detailed above consistent with chronic demyelinating  plaque associated with multiple sclerosis.  None of the foci enhanced.  Compared to the MRI dated 02/15/2017, there is no interval change. 2.   There is a normal enhancement pattern.  MRI brain with and without contrast:  IMPRESSION: This MRI of the brain with and without contrast shows the following: 1.   There are multiple T2/FLAIR hyperintense foci in the hemispheres, brainstem and left middle cerebellar peduncle in a pattern and configuration consistent with chronic demyelinating plaque associated with multiple sclerosis.  None of the foci appears to be acute and they do not enhance.  Compared to the MRI dated 02/15/2017, there is no interval change. 2.   There are no acute findings and there is a normal enhancement pattern.  HISTORY 02/19/20: [name] is a 40 year old female with a history of multiple sclerosis and restless leg syndrome.  She returns today for follow-up.  She remains on Plegridy.  She did have a potential exacerbation in March.  She received IV Solu-Medrol with good benefit.  Reports that her symptoms have been stable since then.  She denies any new numbness or weakness.  Denies any changes with her gait or balance.  She continues to have the self cath.  Remains on Mirapex for restless leg.  She returns today for an evaluation.  HISTORY  08/20/19:  [name] is a 40 year old female with a history of multiple sclerosis and restless leg syndrome.  She returns today for follow-up.  She continues on Plegridy.  She continues to tolerate this medication well.  She denies any new numbness or weakness.  Denies any  changes with her gait or balance.  She is now having to self cath up to 3 times a day.  She has not seen Dr. Zigmund Daniel in 2 years.  She states that she keeps a urinary tract infection despite being on preventative medication.  No change in her vision.  Her last MRI of the brain and cervical spine was in 2018.  She reports that Mirapex has been beneficial for restless leg syndrome.   She continues to take 0.25 mg at dinnertime and at bedtime.  She returns today for evaluation.  REVIEW OF SYSTEMS: Out of a complete 14 system review of symptoms, the patient complains only of the following symptoms, and all other reviewed systems are negative.  See HPI  ALLERGIES: Allergies  Allergen Reactions  . Diclofenac Anaphylaxis  . Zofran [Ondansetron Hcl] Other (See Comments)    Prolonged QT - unable to have zofran per report  . Propoxyphene N-Acetaminophen Rash    HOME MEDICATIONS: Outpatient Medications Prior to Visit  Medication Sig Dispense Refill  . ALPRAZolam (XANAX) 1 MG tablet TAKE ONE-HALF TABLET BY MOUTH TWICE A DAY AS NEEDED FOR ANXIETY 30 tablet 5  . chlorthalidone (HYGROTON) 25 MG tablet 25 mg daily.     Marland Kitchen POTASSIUM CHLORIDE PO Take by mouth. Takes one tablet daily.    . pramipexole (MIRAPEX) 0.25 MG tablet TAKE ONE TABLET BY MOUTH AT DINNER AND ONE TABLET AT BEDTIME 180 tablet 3  . sertraline (ZOLOFT) 25 MG tablet TAKE ONE TABLET (25MG  TOTAL) BY MOUTH ATBEDTIME 90 tablet 3  . vitamin C (ASCORBIC ACID) 500 MG tablet Take 1 tablet by mouth daily.    . Peginterferon Beta-1a (PLEGRIDY) 125 MCG/0.5ML SOPN INJECT 125 MCG (1 PEN) SUBCUTANEOUSLY EVERY 14 DAYS 3 mL 11   No facility-administered medications prior to visit.    PAST MEDICAL HISTORY: Past Medical History:  Diagnosis Date  . Anxiety   . Depression   . Enlarged heart   . Fibroid 04/24/2016  . GERD (gastroesophageal reflux disease)   . Leaky heart valve   . Menorrhagia with irregular cycle 04/13/2016  . Multiple sclerosis ([place])    Dr Lonzo Cloud  . Multiple sclerosis exacerbation ([place])   . Numbness in right leg 02/13,12/12  . Obesity (BMI 35.0-39.9 without comorbidity)   . Other malaise and fatigue 10/20/2013  . Ovarian cyst 04/24/2016  . Regurgitation    cardiac valve. echo 2013 for first eval GILENYA,mod MR,TR    PAST SURGICAL HISTORY: Past Surgical History:  Procedure Laterality Date  .  BACK SURGERY  2006  . CESAREAN SECTION  2000  . CHOLECYSTECTOMY  2010  . COLONOSCOPY  09/14/2010   Fields-hyperplastic polyps, int hemorrhoids  . CYST EXCISION  2006   pilonoidal  . DILITATION & CURRETTAGE/HYSTROSCOPY WITH NOVASURE ABLATION N/A 07/21/2016   Procedure: DILATATION & CURETTAGE/HYSTEROSCOPY WITH NOVASURE ABLATION;  Surgeon: Jonnie Kind, MD;  Location: [facility];  Service: Gynecology;  Laterality: N/A;  Novasure lenght - 5.0 width - 4.1 power - 113 time - 2 min 03 seconds  . ESOPHAGOGASTRODUODENOSCOPY  08/05/2008   Fields-incomplete Schatzki's ring, distal esophageal erosions/esophagitis(mild), small HH, NEGATIVE h pylori  . LAPAROSCOPIC APPENDECTOMY N/A 03/26/2018   Procedure: APPENDECTOMY LAPAROSCOPIC;  Surgeon: Aviva Signs, MD;  Location: [facility];  Service: General;  Laterality: N/A;  . LAPAROSCOPIC BILATERAL SALPINGECTOMY Bilateral 07/21/2016   Procedure: LAPAROSCOPIC RIGHT SALPINGECTOMY AND LEFT OOPHECTOMY;  Surgeon: Jonnie Kind, MD;  Location: [facility];  Service: Gynecology;  Laterality: Bilateral;  . LUMBAR  [place] [facility]    . US ECHOCARDIOGRAPHY  03/19/2012   RV mildly dilated,RA mod. dilated,strial septum aneurysmal,Mod. MR & TR    FAMILY HISTORY: Family History  Problem Relation Age of Onset  . Other Mother        lost leg, has trouble swallowing  . Thyroid nodules Mother   . Heart attack Father   . Coronary artery disease Father   . Diabetes Father   . Rheum arthritis Father   . Colon cancer Paternal Uncle 25  . Colon cancer Paternal Grandmother 43  . Diabetes Paternal Grandmother   . Cancer Paternal Grandmother        breast,lung  . Cancer Maternal Grandmother 17       kind unknown  . Diabetes Maternal Grandmother   . Myasthenia gravis Maternal Grandfather   . Other Sister        tumor on ovary  . ADD / ADHD Son   . Heart attack Paternal Grandfather   . Multiple sclerosis Other        paternal great aunt    SOCIAL HISTORY: Social History    Socioeconomic History  . Marital status: Married    Spouse name: Divorced   . Number of children: 1  . Years of education: college  . Highest education level: Not on file  Occupational History  . Occupation: disabled  Tobacco Use  . Smoking status: Former Smoker    Packs/day: 1.00    Years: 20.00    Pack years: 20.00    Types: Cigarettes    Quit date: 02/15/2012    Years since quitting: 8.8  . Smokeless tobacco: Never Used  Substance and Sexual Activity  . Alcohol use: No    Alcohol/week: 0.0 standard drinks  . Drug use: No  . Sexual activity: Never    Birth control/protection: None  Other Topics Concern  . Not on file  Social History Narrative   Patient is divorced and lives at home and he son lives with her.   Patient has one child.   Patient is disabled.   Patient has some college education.   Patient is right-handed.   Patient drinks 3 servings of caffeine daily      Social Determinants of Health   Financial Resource Strain: Not on file  Food Insecurity: Not on file  Transportation Needs: Not on file  Physical Activity: Not on file  Stress: Not on file  Social Connections: Not on file  Intimate Partner Violence: Not on file      PHYSICAL EXAM  Vitals:   12/22/20 0957  BP: 138/89  Pulse: 74  Weight: 250 lb (113.4 kg)  Height: 5\' 8"  (1.727 m)   Body mass index is 38.01 kg/m.  Generalized: Well developed, in no acute distress   Neurological examination  Mentation: Alert oriented to time, place, history taking. Follows all commands speech and language fluent Cranial nerve II-XII: . Extraocular movements were full, visual field were full on confrontational test. . Head turning and shoulder shrug  were normal and symmetric. Motor: The motor testing reveals 5 over 5 strength of all 4 extremities with the exception of slightly weaker in the right lower extremity.  Good symmetric motor tone is noted throughout.  Sensory: Sensory testing is intact to soft  touch on all 4 extremities. No evidence of extinction is noted.  Coordination: Cerebellar testing reveals good finger-nose-finger and heel-to-shin bilaterally.  Gait and station: Patient has a slight limp when ambulating. Reflexes: Deep tendon reflexes  are symmetric and normal bilaterally.   DIAGNOSTIC DATA (LABS, IMAGING, TESTING) - I reviewed patient records, labs, notes, testing and imaging myself where available.  Lab Results  Component Value Date   WBC 11.1 (H) 11/23/2020   HGB 14.8 11/23/2020   HCT 43.6 11/23/2020   MCV 83 11/23/2020   PLT 363 11/23/2020      Component Value Date/Time   NA 136 11/23/2020 1512   K 4.2 11/23/2020 1512   K 4.2 02/28/2012 1113   CL 99 11/23/2020 1512   CL 107 02/28/2012 1113   CO2 26 11/23/2020 1512   CO2 23 02/28/2012 1113   GLUCOSE 88 11/23/2020 1512   GLUCOSE 125 (H) 03/27/2018 0456   BUN 9 11/23/2020 1512   CREATININE 0.71 11/23/2020 1512   CREATININE 0.73 02/28/2012 1113   CALCIUM 9.7 11/23/2020 1512   CALCIUM 9.5 02/28/2012 1113   PROT 7.0 11/23/2020 1512   PROT 6.4 02/28/2012 1113   ALBUMIN 4.2 11/23/2020 1512   AST 11 11/23/2020 1512   AST 14 02/28/2012 1113   ALT 13 11/23/2020 1512   ALKPHOS 64 11/23/2020 1512   ALKPHOS 49 02/28/2012 1113   BILITOT 0.3 11/23/2020 1512   GFRNONAA 106 11/23/2020 1512   GFRAA 122 11/23/2020 1512   Lab Results  Component Value Date   CHOL 110 12/09/2014   HDL 47 12/09/2014   LDLCALC 50 12/09/2014   TRIG 64 12/09/2014   CHOLHDL 2.3 12/09/2014    Lab Results  Component Value Date   TSH 3.700 12/09/2014      ASSESSMENT AND PLAN 40 y.o. year old female  has a past medical history of Anxiety, Depression, Enlarged heart, Fibroid (04/24/2016), GERD (gastroesophageal reflux disease), Leaky heart valve, Menorrhagia with irregular cycle (04/13/2016), Multiple sclerosis ([place]), Multiple sclerosis exacerbation ([place]), Numbness in right leg (02/13,12/12), Obesity (BMI 35.0-39.9 without comorbidity),  Other malaise and fatigue (10/20/2013), Ovarian cyst (04/24/2016), and Regurgitation. here with:  1.  Multiple sclerosis  -Continue Plegridy for now -We discussed switching her to [place] or [place].  I provided her with information regarding both drugs.  She plans to look over this information and then call us and let us know which when she would like to start.  She went ahead and signed both start forms so she would not have to make another trip to our office.  She recently had blood work through our office and will have blood work next week with her PCP. -Follow-up in 4 months or sooner if needed  Advised if symptoms worsen or she develops new symptoms she should let us know follow-up in 6 months or sooner if needed   I spent 30 minutes of face-to-face and non-face-to-face time with patient.  This included previsit chart review, lab review, study review, order entry, electronic health record documentation, patient education.  Ward Givens, MSN, NP-C 12/22/2020, 10:33 AM [facility] [address], [place], [place] [address] [phone]

## 2020-12-22 NOTE — Patient Instructions (Signed)
Your Plan:  Continue plegridy for now Look over medication options Call once you decide If your symptoms worsen or you develop new symptoms please let us know.    Thank you for coming to see [place] at [facility]. I hope we have been able to provide you high quality care today.  You may receive a patient satisfaction survey over the next few weeks. We would appreciate your feedback and comments so that we may continue to improve ourselves and the health of our patients.

## 2020-12-27 ENCOUNTER — Telehealth: Payer: Self-pay | Admitting: Adult Health

## 2020-12-27 DIAGNOSIS — G35 Multiple sclerosis: Secondary | ICD-10-CM

## 2020-12-27 NOTE — Telephone Encounter (Signed)
Pt called wanting to inform the provider that she has chosen to get on the [place].

## 2020-12-28 NOTE — Telephone Encounter (Signed)
Cathy from [facility] returned RN's call & stated that if it is something that can be faxed, this is fine to do so. She also states that she will be with pts. throughout the day as well if RN needs to speak directly with her.   Best contact: [phone] ext. [place]

## 2020-12-28 NOTE — Telephone Encounter (Signed)
[facility] returned my phone call.  I spoke with Olivia Mackie.  They are able to add on additional lab work and do an EKG.  They need the orders faxed to [phone], attention Olivia Mackie.  I asked her to fax the results to me as soon as they became available.  Orders placed and faxed to [facility].

## 2020-12-28 NOTE — Telephone Encounter (Signed)
I called patient.  She has an appointment tomorrow with Dr. Trena Platt office for blood work.  I advised her she will need additional blood work, EKG, and ME screening to start [place].  I will call Dr. Trena Platt office to find out if we can add on additional blood work and perform the EKG tomorrow.  I will also see if patient's eye doctor in [place] ("[facility]" on [address] can do an [place] screening on the patient.  Patient is agreeable to all the above.

## 2020-12-28 NOTE — Telephone Encounter (Signed)
Zeposia start form signed by [name], NP.  Faxed to [employer].  Received a receipt of confirmation.

## 2020-12-28 NOTE — Telephone Encounter (Addendum)
I called Cathy from [facility].  She advised me that the labs and EKG order will have to be approved by the doctor.  It is possible that he declines to order these and patient will have to go elsewhere for labs and EKG.  I asked her to let us know if the doctor does decide to decline these orders.

## 2020-12-28 NOTE — Telephone Encounter (Signed)
I have reached out to [place] [facility] support to find out if they were able to help with required blood work, EKG, and ME screening. If not, patient will need to return to the office for blood work. She will also need to get an EKG done with cardiology or primary care and also see ophthalmology for an ME screen.

## 2020-12-28 NOTE — Telephone Encounter (Signed)
I called patient. I advised her that I have spoken with [facility] and they require approval from the doctor before our orders can be completed there. I advised patient that when she goes tomorrow for blood work, if they are unable to do the orders we sent to let me know. She may have to come to our office for blood work and perhaps even cardiology for an EKG. Patient will let me know.

## 2020-12-28 NOTE — Telephone Encounter (Signed)
Received this notice from [facility]: "Unfortunately, patients who have government insurance aren't eligible for the in-home services for the assessments.  Only patients with commercial insurance are eligible to get the tests done at home. She will need to go to her Primary Care doctor or another clinic who does ECGs and bloodwork. You can just send an order to where she wants to go.  If she needs help figuring out where to go, she can call her Nurse Navigator from [phone] to assist. Let me know if you need anything else!"

## 2020-12-28 NOTE — Telephone Encounter (Signed)
I called Dr. Trena Platt office.  No answer, left a voicemail asking them to call us back.

## 2020-12-28 NOTE — Telephone Encounter (Signed)
FYI: [facility] Suanne Marker) called, do not leave a message on main number. Want to make sure calls get taken care of. You can leave a message on Cathy's ext: [phone].

## 2020-12-28 NOTE — Telephone Encounter (Signed)
I have already spoken to [place] and Olivia Mackie at [facility].

## 2020-12-29 DIAGNOSIS — Z79899 Other long term (current) drug therapy: Secondary | ICD-10-CM | POA: Diagnosis not present

## 2020-12-29 DIAGNOSIS — Z299 Encounter for prophylactic measures, unspecified: Secondary | ICD-10-CM | POA: Diagnosis not present

## 2020-12-29 DIAGNOSIS — E7800 Pure hypercholesterolemia, unspecified: Secondary | ICD-10-CM | POA: Diagnosis not present

## 2020-12-29 DIAGNOSIS — Z0000 Encounter for general adult medical examination without abnormal findings: Secondary | ICD-10-CM | POA: Diagnosis not present

## 2020-12-29 DIAGNOSIS — Z87891 Personal history of nicotine dependence: Secondary | ICD-10-CM | POA: Diagnosis not present

## 2020-12-29 DIAGNOSIS — I10 Essential (primary) hypertension: Secondary | ICD-10-CM | POA: Diagnosis not present

## 2020-12-30 ENCOUNTER — Telehealth: Payer: Self-pay | Admitting: Adult Health

## 2020-12-30 NOTE — Telephone Encounter (Signed)
Margarita Grizzle from [facility] called needing to speak to the nurse about the pt's Plegridy. Margarita Grizzle states that the pt informed the pharmacy that she was told by her provider that her Plegridy was going to be discontinued and replaced with another medication. The pharmacy is needing to know so they can stop filling it. Please put a note to discontinue the Plegridy on the new prescription when it is faxed over.

## 2020-12-30 NOTE — Telephone Encounter (Signed)
I called pt and she stopped her medication last week (approx 7 days).  She knew that would need a wash out period and also knew that zeposia may not be approved.  [employer] is not her insurance [employer] is.  FYI.

## 2021-01-03 NOTE — Telephone Encounter (Signed)
Cathy from [facility] states that labs/EKG was done last week for pt. & the results were faxed over on 12/31/20.   Best contact: [phone] ext. [place]

## 2021-01-03 NOTE — Telephone Encounter (Signed)
Please advise patient that there is no washout when transitioning from [place] to [place].  When she has the new medication she can stop Plegridy and start the new

## 2021-01-03 NOTE — Telephone Encounter (Signed)
I called [facility].  I left a voicemail with Tye [place] asking her to call me back and let me know if patient was able to complete blood work and EKG last week.

## 2021-01-03 NOTE — Telephone Encounter (Signed)
Received EKG and lab work from December 31, 2020.  Patient's EKG was normal sinus rhythm.  Her CMP was normal.  Her varicella-zoster antibody was positive (normal).  We are waiting on a macular edema screening.

## 2021-01-06 ENCOUNTER — Telehealth: Payer: Self-pay | Admitting: Adult Health

## 2021-01-06 NOTE — Telephone Encounter (Signed)
Pt is asking if something can be called into [facility] for her 2 days of Vertigo

## 2021-01-10 NOTE — Telephone Encounter (Signed)
Please check and see if the patient's vertigo has resolved.  Looking at the last several office visit notes I have never addressed vertigo.  Please get more information from the patient.  She may need to see her PCP

## 2021-01-10 NOTE — Telephone Encounter (Signed)
I called [facility].  Patient has not been scheduled for her macular edema screening.  They have an opening tomorrow at 8 AM.  I called patient and she is agreeable to coming tomorrow at 8 AM her macular edema screening at [place].  I called [facility] back.  They asked that I resend the referral.  Patient is scheduled for 01/11/2021 at 8 AM.

## 2021-01-10 NOTE — Telephone Encounter (Signed)
Called patient who stated that her vertigo hasn't resolved. I advised her that this has not been addressed in our office, and advised she call her PCP. If PCP feels she needs to see neurology I advised she let us know. Patient verbalized understanding, appreciation.

## 2021-01-11 DIAGNOSIS — Z79899 Other long term (current) drug therapy: Secondary | ICD-10-CM | POA: Diagnosis not present

## 2021-01-11 LAB — HM DIABETES EYE EXAM

## 2021-01-17 NOTE — Telephone Encounter (Signed)
I called [facility].  I asked them to fax the notes from patient's appointment last week to me.

## 2021-01-18 NOTE — Telephone Encounter (Signed)
Received clearance form for Zeposia.  [name], NP signed this form.  I faxed it back to [place]. Received a receipt of confirmation.

## 2021-01-18 NOTE — Telephone Encounter (Signed)
I still have not received notes from patient's eye appointment last week.  I called [facility] again.  I spoke with Judson Roch.  She will fax the notes.

## 2021-01-18 NOTE — Telephone Encounter (Signed)
PA for Zeposia maintenance dose via CMM.  Sent to [employer].  Should have a determination within 1-3 business days. Key: [id number].

## 2021-01-18 NOTE — Telephone Encounter (Signed)
I called patient.  I advised her that she has been cleared to start [place].  She may begin to get phone calls from a [facility] 360 to set up shipment of her Zeposia.  I advised her that I have started a prior authorization for Zeposia.  Hopefully, I will hear back regarding a determination in the next few days.  Patient verbalized understanding.

## 2021-01-18 NOTE — Telephone Encounter (Signed)
[name], NP reviewed eye exam notes from eye exam on January 11, 2021 from [facility], NP has cleared patient to start Zeposia therapy.

## 2021-01-18 NOTE — Telephone Encounter (Signed)
Received eye exam notes from January 11, 2021 from [facility].

## 2021-01-18 NOTE — Telephone Encounter (Signed)
I called [facility].  They will fax me the clearance form for patient to start [place].

## 2021-01-19 DIAGNOSIS — J449 Chronic obstructive pulmonary disease, unspecified: Secondary | ICD-10-CM | POA: Diagnosis not present

## 2021-01-19 DIAGNOSIS — D509 Iron deficiency anemia, unspecified: Secondary | ICD-10-CM | POA: Diagnosis not present

## 2021-01-20 DIAGNOSIS — R42 Dizziness and giddiness: Secondary | ICD-10-CM | POA: Diagnosis not present

## 2021-01-20 DIAGNOSIS — I10 Essential (primary) hypertension: Secondary | ICD-10-CM | POA: Diagnosis not present

## 2021-01-20 DIAGNOSIS — Z299 Encounter for prophylactic measures, unspecified: Secondary | ICD-10-CM | POA: Diagnosis not present

## 2021-01-24 NOTE — Telephone Encounter (Signed)
I called [facility] support.  They have transferred the prescription for Zeposia to [facility].  There appears to be an estimated shipped date of January 28, 2021.  They need nothing further from [place].

## 2021-01-24 NOTE — Telephone Encounter (Signed)
PA for Henrietta Dine has been approved by [facility] until 10/22/2021. PA #: [id number].

## 2021-01-24 NOTE — Telephone Encounter (Signed)
I called patient.  She reports that she has already received the Zeposia starter pack.  She is on day 3.  She is tolerating it well.  She denies any questions or concerns.  I reminded her of her follow-up appointment on May 05, 2021.  Patient verbalized understanding and appreciation.

## 2021-01-27 ENCOUNTER — Telehealth: Payer: Self-pay | Admitting: Adult Health

## 2021-01-27 NOTE — Telephone Encounter (Signed)
Pt called wanting to speak to the Provider or RN regarding her Ozanimod HCl (ZEPOSIA) 0.92 MG CAPS Please advise.

## 2021-01-27 NOTE — Telephone Encounter (Signed)
I spoke to pt and she was taking the zeposia at night for the last 3 days and  was having in the am, nausea, cold sweats lasting for 3 hours,  She was asking if ok to take in am,  I relayed that she can try this, staring tomorrow, skip tonights dose, and see if taking with food in am would help.  She will let us know on Monday.

## 2021-02-10 DIAGNOSIS — R339 Retention of urine, unspecified: Secondary | ICD-10-CM | POA: Diagnosis not present

## 2021-03-28 ENCOUNTER — Ambulatory Visit
Admission: RE | Admit: 2021-03-28 | Discharge: 2021-03-28 | Disposition: A | Discharge status: Home / Self Care | Payer: Medicare Other | Source: Ambulatory Visit | Attending: Internal Medicine | Admitting: Internal Medicine

## 2021-03-28 ENCOUNTER — Other Ambulatory Visit: Payer: Self-pay

## 2021-03-28 ENCOUNTER — Other Ambulatory Visit: Payer: Self-pay | Admitting: Internal Medicine

## 2021-03-28 DIAGNOSIS — Z139 Encounter for screening, unspecified: Secondary | ICD-10-CM

## 2021-03-28 DIAGNOSIS — Z1231 Encounter for screening mammogram for malignant neoplasm of breast: Secondary | ICD-10-CM | POA: Diagnosis not present

## 2021-04-21 DIAGNOSIS — E1165 Type 2 diabetes mellitus with hyperglycemia: Secondary | ICD-10-CM | POA: Diagnosis not present

## 2021-04-21 DIAGNOSIS — E7849 Other hyperlipidemia: Secondary | ICD-10-CM | POA: Diagnosis not present

## 2021-04-21 DIAGNOSIS — I509 Heart failure, unspecified: Secondary | ICD-10-CM | POA: Diagnosis not present

## 2021-04-25 ENCOUNTER — Other Ambulatory Visit: Payer: Self-pay | Admitting: Adult Health

## 2021-05-05 ENCOUNTER — Ambulatory Visit (INDEPENDENT_AMBULATORY_CARE_PROVIDER_SITE_OTHER): Payer: Medicare Other | Admitting: Adult Health

## 2021-05-05 VITALS — BP 118/84 | HR 70 | Ht 68.0 in | Wt 249.1 lb

## 2021-05-05 DIAGNOSIS — M545 Low back pain, unspecified: Secondary | ICD-10-CM | POA: Diagnosis not present

## 2021-05-05 DIAGNOSIS — G35 Multiple sclerosis: Secondary | ICD-10-CM

## 2021-05-05 NOTE — Progress Notes (Signed)
PATIENT: [name] DOB: 1981-05-18  REASON FOR VISIT: follow up HISTORY FROM: patient Primary neurologist: Dr. Brett Fairy  HISTORY OF PRESENT ILLNESS: Today 05/05/21:  [name] is a 40 year old female with a history of multiple sclerosis.  She returns today for follow-up.  She has been started on Zeposia she states that since she started this medication her back pain has tripled.  Reports that she is always had low back pain.  We had back surgery in 2006.  However she has noticed that since she started this medication her pain has tripled.  She denies any new numbness or tingling.  No changes with her gait or balance.  No changes with the bowels or bladder-continues to self cath.  She returns today for an evaluation.  12/22/20: [name] is a 40 year old female with a history of multiple sclerosis.  She returns today to discuss MRI results.  MRI of the brain did show progression  And an active demyelination.  The patient has been on Plegridy.  She has noticed some weakness in her right leg and right hand.   MRI brain with and without contrast December 10, 2020:  IMPRESSION: New subcentimeter focus of enhancement in the right frontal periventricular white matter most consistent with active demyelination. Otherwise stable moderate burden of chronic demyelination. Stable parenchymal volume loss.  MRI cervical spine with and without contrast December 10, 2020:  IMPRESSION: Likely no substantial change in chronic demyelinating cord lesions. No definite new lesion or abnormal enhancement.    11/23/20: [name] is a 40 year old female with a history of multiple sclerosis and restless leg syndrome.  She returns today for follow-up.  She remains on Plegridy.patient reports that since Aug 26, 2023 she has noticed more weakness in the right leg.  She states that she has been under a lot of stress as her father passed away in 2023/08/26 and she has had to move.  No changes with the bowels or  bladder-continues to self cath.  She plans to follow-up with her urologist to discuss any medication options.  Reports that she has had minimal changes in her gait mainly due to increased weakness in the right leg..  Denies any changes in her vision.  Remains on Mirapex for restless leg.  She returns today for an evaluation.    Imaging 09/22/2019:  MRI cervical spine with and without contrast: IMPRESSION: This MRI of the cervical spine with and without contrast shows the following: 1.   Multiple T2 hyperintense foci within the spinal cord as detailed above consistent with chronic demyelinating plaque associated with multiple sclerosis.  None of the foci enhanced.  Compared to the MRI dated 02/15/2017, there is no interval change. 2.   There is a normal enhancement pattern.  MRI brain with and without contrast:  IMPRESSION: This MRI of the brain with and without contrast shows the following: 1.   There are multiple T2/FLAIR hyperintense foci in the hemispheres, brainstem and left middle cerebellar peduncle in a pattern and configuration consistent with chronic demyelinating plaque associated with multiple sclerosis.  None of the foci appears to be acute and they do not enhance.  Compared to the MRI dated 02/15/2017, there is no interval change. 2.   There are no acute findings and there is a normal enhancement pattern.  HISTORY 02/19/20: [name] is a 40 year old female with a history of multiple sclerosis and restless leg syndrome.  She returns today for follow-up.  She remains on Plegridy.  She did have a potential  exacerbation in March.  She received IV Solu-Medrol with good benefit.  Reports that her symptoms have been stable since then.  She denies any new numbness or weakness.  Denies any changes with her gait or balance.  She continues to have the self cath.  Remains on Mirapex for restless leg.  She returns today for an evaluation.  HISTORY  08/20/19:   [name] is a 40 year old female with  a history of multiple sclerosis and restless leg syndrome.  She returns today for follow-up.  She continues on Plegridy.  She continues to tolerate this medication well.  She denies any new numbness or weakness.  Denies any changes with her gait or balance.  She is now having to self cath up to 3 times a day.  She has not seen Dr. Zigmund Daniel in 2 years.  She states that she keeps a urinary tract infection despite being on preventative medication.  No change in her vision.  Her last MRI of the brain and cervical spine was in 2018.  She reports that Mirapex has been beneficial for restless leg syndrome.  She continues to take 0.25 mg at dinnertime and at bedtime.  She returns today for evaluation.  REVIEW OF SYSTEMS: Out of a complete 14 system review of symptoms, the patient complains only of the following symptoms, and all other reviewed systems are negative.  See HPI  ALLERGIES: Allergies  Allergen Reactions   Diclofenac Anaphylaxis   Zofran [Ondansetron Hcl] Other (See Comments)    Prolonged QT - unable to have zofran per report   Propoxyphene N-Acetaminophen Rash    HOME MEDICATIONS: Outpatient Medications Prior to Visit  Medication Sig Dispense Refill   ALPRAZolam (XANAX) 1 MG tablet TAKE ONE-HALF TABLET BY MOUTH TWICE A DAY AS NEEDED FOR ANXIETY 30 tablet 5   chlorthalidone (HYGROTON) 25 MG tablet 25 mg daily.      POTASSIUM CHLORIDE PO Take by mouth. Takes one tablet daily.     pramipexole (MIRAPEX) 0.25 MG tablet TAKE ONE TABLET BY MOUTH AT DINNER AND ONE TABLET AT BEDTIME 180 tablet 3   sertraline (ZOLOFT) 25 MG tablet TAKE ONE TABLET (25MG  TOTAL) BY MOUTH ATBEDTIME 90 tablet 3   vitamin C (ASCORBIC ACID) 500 MG tablet Take 1 tablet by mouth daily.     ZEPOSIA 0.92 MG CAPS TAKE 1 CAPSULE (0.92MG ) BY  MOUTH DAILY 90 capsule 0   No facility-administered medications prior to visit.    PAST MEDICAL HISTORY: Past Medical History:  Diagnosis Date   Anxiety    Depression    Enlarged  heart    Fibroid 04/24/2016   GERD (gastroesophageal reflux disease)    Leaky heart valve    Menorrhagia with irregular cycle 04/13/2016   Multiple sclerosis ([place])    Dr Lonzo Cloud   Multiple sclerosis exacerbation ([place])    Numbness in right leg 02/13,12/12   Obesity (BMI 35.0-39.9 without comorbidity)    Other malaise and fatigue 10/20/2013   Ovarian cyst 04/24/2016   Regurgitation    cardiac valve. echo 2013 for first eval GILENYA,mod MR,TR    PAST SURGICAL HISTORY: Past Surgical History:  Procedure Laterality Date   BACK SURGERY  2006   CESAREAN SECTION  2000   CHOLECYSTECTOMY  2010   COLONOSCOPY  09/14/2010   Fields-hyperplastic polyps, int hemorrhoids   CYST EXCISION  2006   pilonoidal   DILITATION & CURRETTAGE/HYSTROSCOPY WITH NOVASURE ABLATION N/A 07/21/2016   Procedure: DILATATION & CURETTAGE/HYSTEROSCOPY WITH NOVASURE ABLATION;  Surgeon: Jenny Reichmann  France Ravens, MD;  Location: [facility];  Service: Gynecology;  Laterality: N/A;  Novasure lenght - 5.0 width - 4.1 power - 113 time - 2 min 03 seconds   ESOPHAGOGASTRODUODENOSCOPY  08/05/2008   Fields-incomplete Schatzki's ring, distal esophageal erosions/esophagitis(mild), small HH, NEGATIVE h pylori   LAPAROSCOPIC APPENDECTOMY N/A 03/26/2018   Procedure: APPENDECTOMY LAPAROSCOPIC;  Surgeon: Aviva Signs, MD;  Location: [facility];  Service: General;  Laterality: N/A;   LAPAROSCOPIC BILATERAL SALPINGECTOMY Bilateral 07/21/2016   Procedure: LAPAROSCOPIC RIGHT SALPINGECTOMY AND LEFT OOPHECTOMY;  Surgeon: Jonnie Kind, MD;  Location: [facility];  Service: Gynecology;  Laterality: Bilateral;   LUMBAR DISC SURGERY     US ECHOCARDIOGRAPHY  03/19/2012   RV mildly dilated,RA mod. dilated,strial septum aneurysmal,Mod. MR & TR    FAMILY HISTORY: Family History  Problem Relation Age of Onset   Other Mother        lost leg, has trouble swallowing   Thyroid nodules Mother    Heart attack Father    Coronary artery disease Father    Diabetes Father     Rheum arthritis Father    Colon cancer Paternal Uncle 9   Colon cancer Paternal Grandmother 41   Diabetes Paternal Grandmother    Cancer Paternal Grandmother        breast,lung   Cancer Maternal Grandmother 56       kind unknown   Diabetes Maternal Grandmother    Myasthenia gravis Maternal Grandfather    Other Sister        tumor on ovary   ADD / ADHD Son    Heart attack Paternal Grandfather    Multiple sclerosis Other        paternal great aunt   Breast cancer Neg Hx     SOCIAL HISTORY: Social History   Socioeconomic History   Marital status: Married    Spouse name: Divorced    Number of children: 1   Years of education: college   Highest education level: Not on file  Occupational History   Occupation: disabled  Tobacco Use   Smoking status: Former    Packs/day: 1.00    Years: 20.00    Pack years: 20.00    Types: Cigarettes    Quit date: 02/15/2012    Years since quitting: 9.2   Smokeless tobacco: Never  Substance and Sexual Activity   Alcohol use: No    Alcohol/week: 0.0 standard drinks   Drug use: No   Sexual activity: Never    Birth control/protection: None  Other Topics Concern   Not on file  Social History Narrative   Patient is divorced and lives at home and he son lives with her.   Patient has one child.   Patient is disabled.   Patient has some college education.   Patient is right-handed.   Patient drinks 3 servings of caffeine daily      Social Determinants of Health   Financial Resource Strain: Not on file  Food Insecurity: Not on file  Transportation Needs: Not on file  Physical Activity: Not on file  Stress: Not on file  Social Connections: Not on file  Intimate Partner Violence: Not on file      PHYSICAL EXAM  There were no vitals filed for this visit.  There is no height or weight on file to calculate BMI.  Generalized: Well developed, in no acute distress   Neurological examination  Mentation: Alert oriented to time,  place, history taking. Follows all commands speech and language  fluent Cranial nerve II-XII: . Extraocular movements were full, visual field were full on confrontational test. . Head turning and shoulder shrug  were normal and symmetric. Motor: The motor testing reveals 5 over 5 strength of all 4 extremities with the exception of slightly weaker in the right lower extremity.  Good symmetric motor tone is noted throughout.  Sensory: Sensory testing is intact to soft touch on all 4 extremities. No evidence of extinction is noted.  Coordination: Cerebellar testing reveals good finger-nose-finger and heel-to-shin bilaterally.  Gait and station: Patient has a slight limp when ambulating. Reflexes: Deep tendon reflexes are symmetric and normal bilaterally.   DIAGNOSTIC DATA (LABS, IMAGING, TESTING) - I reviewed patient records, labs, notes, testing and imaging myself where available.  Lab Results  Component Value Date   WBC 11.1 (H) 11/23/2020   HGB 14.8 11/23/2020   HCT 43.6 11/23/2020   MCV 83 11/23/2020   PLT 363 11/23/2020      Component Value Date/Time   NA 136 11/23/2020 1512   K 4.2 11/23/2020 1512   K 4.2 02/28/2012 1113   CL 99 11/23/2020 1512   CL 107 02/28/2012 1113   CO2 26 11/23/2020 1512   CO2 23 02/28/2012 1113   GLUCOSE 88 11/23/2020 1512   GLUCOSE 125 (H) 03/27/2018 0456   BUN 9 11/23/2020 1512   CREATININE 0.71 11/23/2020 1512   CREATININE 0.73 02/28/2012 1113   CALCIUM 9.7 11/23/2020 1512   CALCIUM 9.5 02/28/2012 1113   PROT 7.0 11/23/2020 1512   PROT 6.4 02/28/2012 1113   ALBUMIN 4.2 11/23/2020 1512   AST 11 11/23/2020 1512   AST 14 02/28/2012 1113   ALT 13 11/23/2020 1512   ALKPHOS 64 11/23/2020 1512   ALKPHOS 49 02/28/2012 1113   BILITOT 0.3 11/23/2020 1512   GFRNONAA 106 11/23/2020 1512   GFRAA 122 11/23/2020 1512   Lab Results  Component Value Date   CHOL 110 12/09/2014   HDL 47 12/09/2014   LDLCALC 50 12/09/2014   TRIG 64 12/09/2014   CHOLHDL  2.3 12/09/2014    Lab Results  Component Value Date   TSH 3.700 12/09/2014      ASSESSMENT AND PLAN 40 y.o. year old female  has a past medical history of Anxiety, Depression, Enlarged heart, Fibroid (04/24/2016), GERD (gastroesophageal reflux disease), Leaky heart valve, Menorrhagia with irregular cycle (04/13/2016), Multiple sclerosis ([place]), Multiple sclerosis exacerbation ([place]), Numbness in right leg (02/13,12/12), Obesity (BMI 35.0-39.9 without comorbidity), Other malaise and fatigue (10/20/2013), Ovarian cyst (04/24/2016), and Regurgitation. here with:  1.  Multiple sclerosis 2.  Low back pain-midline without sciatica  - Continue Zeposia - MRI WO contrast lumbar spine  -Possible side effect of Zeposia is back pain however we will rule out any changes to the lumbar spine-last imaging was done in 2018 results reviewed in epic. - blood work today -Follow-up in 4 months or sooner if needed  Advised if symptoms worsen or she develops new symptoms she should let us know follow-up in 6 months or sooner if needed   Ward Givens, MSN, NP-C 05/05/2021, 1:31 PM [facility] [address] [place], [place] [address] [phone]

## 2021-05-05 NOTE — Patient Instructions (Signed)
-   Continue Zeposia - MRI WO contrast lumbar spine  - blood work today

## 2021-05-06 LAB — CBC WITH DIFFERENTIAL/PLATELET
Basophils Absolute: 0.1 10*3/uL (ref 0.0–0.2)
Basos: 1 %
EOS (ABSOLUTE): 0.4 10*3/uL (ref 0.0–0.4)
Eos: 4 %
Hematocrit: 42.6 % (ref 34.0–46.6)
Hemoglobin: 14.4 g/dL (ref 11.1–15.9)
Immature Grans (Abs): 0 10*3/uL (ref 0.0–0.1)
Immature Granulocytes: 0 %
Lymphocytes Absolute: 0.4 10*3/uL — ABNORMAL LOW (ref 0.7–3.1)
Lymphs: 4 %
MCH: 27.9 pg (ref 26.6–33.0)
MCHC: 33.8 g/dL (ref 31.5–35.7)
MCV: 83 fL (ref 79–97)
Monocytes Absolute: 0.7 10*3/uL (ref 0.1–0.9)
Monocytes: 7 %
Neutrophils Absolute: 8.6 10*3/uL — ABNORMAL HIGH (ref 1.4–7.0)
Neutrophils: 84 %
Platelets: 323 10*3/uL (ref 150–450)
RBC: 5.16 x10E6/uL (ref 3.77–5.28)
RDW: 12.8 % (ref 11.7–15.4)
WBC: 10.1 10*3/uL (ref 3.4–10.8)

## 2021-05-06 LAB — COMPREHENSIVE METABOLIC PANEL
ALT: 17 IU/L (ref 0–32)
AST: 16 IU/L (ref 0–40)
Albumin/Globulin Ratio: 2 (ref 1.2–2.2)
Albumin: 4.3 g/dL (ref 3.8–4.8)
Alkaline Phosphatase: 55 IU/L (ref 44–121)
BUN/Creatinine Ratio: 17 (ref 9–23)
BUN: 12 mg/dL (ref 6–24)
Bilirubin Total: 0.2 mg/dL (ref 0.0–1.2)
CO2: 25 mmol/L (ref 20–29)
Calcium: 9.3 mg/dL (ref 8.7–10.2)
Chloride: 103 mmol/L (ref 96–106)
Creatinine, Ser: 0.69 mg/dL (ref 0.57–1.00)
Globulin, Total: 2.2 g/dL (ref 1.5–4.5)
Glucose: 91 mg/dL (ref 65–99)
Potassium: 4.6 mmol/L (ref 3.5–5.2)
Sodium: 140 mmol/L (ref 134–144)
Total Protein: 6.5 g/dL (ref 6.0–8.5)
eGFR: 111 mL/min/{1.73_m2} (ref >59)

## 2021-05-16 ENCOUNTER — Other Ambulatory Visit: Payer: Self-pay

## 2021-05-16 ENCOUNTER — Ambulatory Visit
Admission: RE | Admit: 2021-05-16 | Discharge: 2021-05-16 | Disposition: A | Discharge status: Home / Self Care | Payer: Medicare Other | Source: Ambulatory Visit | Attending: Adult Health | Admitting: Adult Health

## 2021-05-16 DIAGNOSIS — M545 Low back pain, unspecified: Secondary | ICD-10-CM | POA: Diagnosis not present

## 2021-05-18 DIAGNOSIS — N3644 Muscular disorders of urethra: Secondary | ICD-10-CM | POA: Diagnosis not present

## 2021-05-19 ENCOUNTER — Encounter: Payer: Self-pay | Admitting: Adult Health

## 2021-05-23 NOTE — Telephone Encounter (Signed)
Please let patient know that their is some progression of worsening since her last MRI in 2019- I would like her to see her surgeon for eval.

## 2021-05-24 ENCOUNTER — Telehealth: Payer: Self-pay | Admitting: *Deleted

## 2021-05-24 ENCOUNTER — Telehealth: Payer: Self-pay

## 2021-05-24 DIAGNOSIS — M545 Low back pain, unspecified: Secondary | ICD-10-CM

## 2021-05-24 NOTE — Telephone Encounter (Signed)
I called pt and relayed results of MRI. She has not seen surgeon she states since 2006 (Dr. Hal Neer)?  I placed referral for her, please sign off if ok.

## 2021-05-24 NOTE — Telephone Encounter (Signed)
Referral to neurosurgery sent to [facility]. P: [phone].

## 2021-05-24 NOTE — Telephone Encounter (Signed)
-----   Message from Melvenia Beam, MD sent at 05/19/2021  5:30 PM EDT ----- There has been progression of degenerative changes. In particular, worsening at L3/L4 and L4/L5 levels with possible impingment of right L4 and left L5 nerve roots.

## 2021-05-26 ENCOUNTER — Ambulatory Visit: Payer: Medicare HMO | Admitting: Adult Health

## 2021-06-01 DIAGNOSIS — M5136 Other intervertebral disc degeneration, lumbar region: Secondary | ICD-10-CM | POA: Diagnosis not present

## 2021-06-01 DIAGNOSIS — M461 Sacroiliitis, not elsewhere classified: Secondary | ICD-10-CM | POA: Diagnosis not present

## 2021-06-03 ENCOUNTER — Other Ambulatory Visit: Payer: Self-pay | Admitting: Neurosurgery

## 2021-06-03 ENCOUNTER — Other Ambulatory Visit: Payer: Self-pay | Admitting: Adult Health

## 2021-06-03 DIAGNOSIS — M5136 Other intervertebral disc degeneration, lumbar region: Secondary | ICD-10-CM

## 2021-06-09 ENCOUNTER — Other Ambulatory Visit: Payer: Self-pay

## 2021-06-09 ENCOUNTER — Ambulatory Visit
Admission: RE | Admit: 2021-06-09 | Discharge: 2021-06-09 | Disposition: A | Discharge status: Home / Self Care | Payer: Medicare Other | Source: Ambulatory Visit | Attending: Neurosurgery | Admitting: Neurosurgery

## 2021-06-09 DIAGNOSIS — M5136 Other intervertebral disc degeneration, lumbar region: Secondary | ICD-10-CM | POA: Diagnosis not present

## 2021-06-09 DIAGNOSIS — M47816 Spondylosis without myelopathy or radiculopathy, lumbar region: Secondary | ICD-10-CM | POA: Diagnosis not present

## 2021-06-09 MED ORDER — METHYLPREDNISOLONE ACETATE 40 MG/ML INJ SUSP (RADIOLOG
80.0000 mg | Freq: Once | INTRAMUSCULAR | Status: AC
  Administered 2021-06-09: 80 mg via EPIDURAL

## 2021-06-09 MED ORDER — IOPAMIDOL (ISOVUE-M 200) INJECTION 41%
1.0000 mL | Freq: Once | INTRAMUSCULAR | Status: AC
  Administered 2021-06-09: 1 mL via EPIDURAL

## 2021-06-09 NOTE — Discharge Instructions (Signed)

## 2021-06-24 ENCOUNTER — Encounter: Payer: Self-pay | Admitting: Adult Health

## 2021-07-06 ENCOUNTER — Encounter: Payer: Self-pay | Admitting: Adult Health

## 2021-07-11 DIAGNOSIS — M5136 Other intervertebral disc degeneration, lumbar region: Secondary | ICD-10-CM | POA: Diagnosis not present

## 2021-07-19 DIAGNOSIS — M5136 Other intervertebral disc degeneration, lumbar region: Secondary | ICD-10-CM | POA: Diagnosis not present

## 2021-07-19 DIAGNOSIS — M47816 Spondylosis without myelopathy or radiculopathy, lumbar region: Secondary | ICD-10-CM | POA: Diagnosis not present

## 2021-07-28 DIAGNOSIS — M47816 Spondylosis without myelopathy or radiculopathy, lumbar region: Secondary | ICD-10-CM | POA: Diagnosis not present

## 2021-08-03 DIAGNOSIS — R339 Retention of urine, unspecified: Secondary | ICD-10-CM | POA: Diagnosis not present

## 2021-08-11 ENCOUNTER — Other Ambulatory Visit: Payer: Self-pay | Admitting: Adult Health

## 2021-08-11 NOTE — Telephone Encounter (Signed)
Per [place] registry, last filled #[phone] on 04/30/2021. Pt is up to date on appts. Refill request sent to [facility] NP.

## 2021-08-22 DIAGNOSIS — I509 Heart failure, unspecified: Secondary | ICD-10-CM | POA: Diagnosis not present

## 2021-08-22 DIAGNOSIS — E1165 Type 2 diabetes mellitus with hyperglycemia: Secondary | ICD-10-CM | POA: Diagnosis not present

## 2021-08-22 DIAGNOSIS — E7849 Other hyperlipidemia: Secondary | ICD-10-CM | POA: Diagnosis not present

## 2021-08-27 ENCOUNTER — Ambulatory Visit
Admission: EM | Admit: 2021-08-27 | Discharge: 2021-08-27 | Disposition: A | Discharge status: Home / Self Care | Payer: Medicare Other | Attending: Family Medicine | Admitting: Family Medicine

## 2021-08-27 ENCOUNTER — Emergency Department (HOSPITAL_COMMUNITY): Payer: Medicare Other

## 2021-08-27 ENCOUNTER — Other Ambulatory Visit: Payer: Self-pay

## 2021-08-27 ENCOUNTER — Emergency Department (HOSPITAL_COMMUNITY)
Admission: EM | Admit: 2021-08-27 | Discharge: 2021-08-27 | Disposition: A | Discharge status: Home / Self Care | Payer: Medicare Other | Attending: Emergency Medicine | Admitting: Emergency Medicine

## 2021-08-27 ENCOUNTER — Encounter: Payer: Self-pay | Admitting: Emergency Medicine

## 2021-08-27 ENCOUNTER — Encounter (HOSPITAL_COMMUNITY): Payer: Self-pay | Admitting: Emergency Medicine

## 2021-08-27 DIAGNOSIS — R0789 Other chest pain: Secondary | ICD-10-CM | POA: Insufficient documentation

## 2021-08-27 DIAGNOSIS — N3000 Acute cystitis without hematuria: Secondary | ICD-10-CM | POA: Diagnosis not present

## 2021-08-27 DIAGNOSIS — Z20822 Contact with and (suspected) exposure to covid-19: Secondary | ICD-10-CM | POA: Diagnosis not present

## 2021-08-27 DIAGNOSIS — R0602 Shortness of breath: Secondary | ICD-10-CM | POA: Insufficient documentation

## 2021-08-27 DIAGNOSIS — R079 Chest pain, unspecified: Secondary | ICD-10-CM

## 2021-08-27 DIAGNOSIS — Z87891 Personal history of nicotine dependence: Secondary | ICD-10-CM | POA: Insufficient documentation

## 2021-08-27 DIAGNOSIS — Z789 Other specified health status: Secondary | ICD-10-CM

## 2021-08-27 DIAGNOSIS — Z960 Presence of urogenital implants: Secondary | ICD-10-CM | POA: Diagnosis not present

## 2021-08-27 LAB — RESP PANEL BY RT-PCR (FLU A&B, COVID) ARPGX2
Influenza A by PCR: NEGATIVE
Influenza B by PCR: NEGATIVE
SARS Coronavirus 2 by RT PCR: NEGATIVE

## 2021-08-27 LAB — HEPATIC FUNCTION PANEL
ALT: 23 U/L (ref 0–44)
AST: 17 U/L (ref 15–41)
Albumin: 3.7 g/dL (ref 3.5–5.0)
Alkaline Phosphatase: 43 U/L (ref 38–126)
Bilirubin, Direct: 0.1 mg/dL (ref 0.0–0.2)
Indirect Bilirubin: 0.5 mg/dL (ref 0.3–0.9)
Total Bilirubin: 0.6 mg/dL (ref 0.3–1.2)
Total Protein: 6.6 g/dL (ref 6.5–8.1)

## 2021-08-27 LAB — BASIC METABOLIC PANEL
Anion gap: 6 (ref 5–15)
BUN: 14 mg/dL (ref 6–20)
CO2: 28 mmol/L (ref 22–32)
Calcium: 8.9 mg/dL (ref 8.9–10.3)
Chloride: 103 mmol/L (ref 98–111)
Creatinine, Ser: 0.58 mg/dL (ref 0.44–1.00)
GFR, Estimated: >60 mL/min (ref >60)
Glucose, Bld: 92 mg/dL (ref 70–99)
Potassium: 4 mmol/L (ref 3.5–5.1)
Sodium: 137 mmol/L (ref 135–145)

## 2021-08-27 LAB — URINALYSIS, ROUTINE W REFLEX MICROSCOPIC
Bilirubin Urine: NEGATIVE
Glucose, UA: NEGATIVE mg/dL
Ketones, ur: NEGATIVE mg/dL
Nitrite: POSITIVE — AB
Protein, ur: NEGATIVE mg/dL
Specific Gravity, Urine: 1.021 (ref 1.005–1.030)
pH: 6 (ref 5.0–8.0)

## 2021-08-27 LAB — CBC
HCT: 42.4 % (ref 36.0–46.0)
Hemoglobin: 14.2 g/dL (ref 12.0–15.0)
MCH: 29.6 pg (ref 26.0–34.0)
MCHC: 33.5 g/dL (ref 30.0–36.0)
MCV: 88.3 fL (ref 80.0–100.0)
Platelets: 299 10*3/uL (ref 150–400)
RBC: 4.8 MIL/uL (ref 3.87–5.11)
RDW: 13.1 % (ref 11.5–15.5)
WBC: 9 10*3/uL (ref 4.0–10.5)
nRBC: 0 % (ref 0.0–0.2)

## 2021-08-27 LAB — POC URINE PREG, ED: Preg Test, Ur: NEGATIVE

## 2021-08-27 LAB — TROPONIN I (HIGH SENSITIVITY)
Troponin I (High Sensitivity): <2 ng/L (ref <18)
Troponin I (High Sensitivity): <2 ng/L (ref <18)

## 2021-08-27 LAB — D-DIMER, QUANTITATIVE: D-Dimer, Quant: <0.27 ug/mL-FEU (ref 0.00–0.50)

## 2021-08-27 MED ORDER — CEPHALEXIN 500 MG PO CAPS: 500.0000 mg | ORAL_CAPSULE | Freq: Four times a day (QID) | ORAL | 0 refills | Status: DC

## 2021-08-27 MED ORDER — ALUM & MAG HYDROXIDE-SIMETH 200-200-20 MG/5ML PO SUSP
30.0000 mL | Freq: Once | ORAL | Status: AC
  Administered 2021-08-27: 30 mL via ORAL
  Filled 2021-08-27: qty 30

## 2021-08-27 MED ORDER — HYDROCODONE-ACETAMINOPHEN 5-325 MG PO TABS: 1.0000 | ORAL_TABLET | ORAL | 0 refills | Status: DC | PRN

## 2021-08-27 MED ORDER — CEPHALEXIN 500 MG PO CAPS
500.0000 mg | ORAL_CAPSULE | Freq: Once | ORAL | Status: AC
  Administered 2021-08-27: 500 mg via ORAL
  Filled 2021-08-27: qty 1

## 2021-08-27 NOTE — ED Provider Notes (Signed)
[facility] EMERGENCY DEPARTMENT Provider Note   CSN: [id number] Arrival date & time: 08/27/21  1448     History Chief Complaint  Patient presents with   Chest Pain    [name] is a 40 y.o. female.  Pt presents to the ED today with cp and sob.  Pt said sx started last night.  She said it feels like something is sitting on her chest.  She also said it hurts to touch.  No f/c.  No cough.  She went to UC this morning who directed her to the ED.  Pt has chronic pain in her legs from MS, but no new pain or swelling.      Past Medical History:  Diagnosis Date   Anxiety    Depression    Enlarged heart    Fibroid 04/24/2016   GERD (gastroesophageal reflux disease)    Leaky heart valve    Menorrhagia with irregular cycle 04/13/2016   Multiple sclerosis ([place])    Dr Lonzo Cloud   Multiple sclerosis exacerbation ([place])    Numbness in right leg 02/13,12/12   Obesity (BMI 35.0-39.9 without comorbidity)    Other malaise and fatigue 10/20/2013   Ovarian cyst 04/24/2016   Regurgitation    cardiac valve. echo 2013 for first eval GILENYA,mod MR,TR    Patient Active Problem List   Diagnosis Date Noted   Irritability and anger 02/13/2019   Restless legs syndrome (RLS) 02/13/2019   Acute appendicitis    Enteritis 03/25/2018   Sciatica of right side associated with disorder of lumbosacral spine 05/09/2017   Status post lumbar spinal fusion 05/09/2017   Encounter for sterilization education 06/21/2016   Menorrhagia with regular cycle 06/21/2016   Fibroid 04/24/2016   Ovarian cyst 04/24/2016   Menorrhagia with irregular cycle 04/13/2016   Frontal sinusitis 09/30/2015   Photosensitivity 03/30/2015   Vertigo, aural 03/30/2015   Morbid obesity ([place]) 10/19/2014   Diplopia 10/19/2014   MS (multiple sclerosis) ([place]) 06/17/2014   Abnormality of gait 06/17/2014   Other malaise and fatigue 10/20/2013   Insomnia 08/06/2013   Mitral insufficiency 05/17/2013   Tricuspid insufficiency  05/17/2013   Multiple sclerosis ([place]) 01/23/2013   Severe obesity (BMI >= 40) ([place]) 01/23/2013   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   Depression with anxiety 01/23/2013   Anemia 02/22/2012   Hematochezia 02/22/2012   Globus sensation 02/22/2012   Dysphagia 02/22/2012   GERD 09/01/2010   CONSTIPATION, CHRONIC 09/01/2010    Past Surgical History:  Procedure Laterality Date   BACK SURGERY  2006   CESAREAN SECTION  2000   CHOLECYSTECTOMY  2010   COLONOSCOPY  09/14/2010   Fields-hyperplastic polyps, int hemorrhoids   CYST EXCISION  2006   pilonoidal   DILITATION & CURRETTAGE/HYSTROSCOPY WITH NOVASURE ABLATION N/A 07/21/2016   Procedure: DILATATION & CURETTAGE/HYSTEROSCOPY WITH NOVASURE ABLATION;  Surgeon: Jonnie Kind, MD;  Location: [facility];  Service: Gynecology;  Laterality: N/A;  Novasure lenght - 5.0 width - 4.1 power - 113 time - 2 min 03 seconds   ESOPHAGOGASTRODUODENOSCOPY  08/05/2008   Fields-incomplete Schatzki's ring, distal esophageal erosions/esophagitis(mild), small HH, NEGATIVE h pylori   LAPAROSCOPIC APPENDECTOMY N/A 03/26/2018   Procedure: APPENDECTOMY LAPAROSCOPIC;  Surgeon: Aviva Signs, MD;  Location: [facility] ORS;  Service: General;  Laterality: N/A;   LAPAROSCOPIC BILATERAL SALPINGECTOMY Bilateral 07/21/2016   Procedure: LAPAROSCOPIC RIGHT SALPINGECTOMY AND LEFT OOPHECTOMY;  Surgeon: Jonnie Kind, MD;  Location: [facility] ORS;  Service: Gynecology;  Laterality: Bilateral;   LUMBAR DISC SURGERY  US ECHOCARDIOGRAPHY  03/19/2012   RV mildly dilated,RA mod. dilated,strial septum aneurysmal,Mod. MR & TR     OB History     Gravida  1   Para  1   Term      Preterm      AB      Living  1      SAB      IAB      Ectopic      Multiple      Live Births              Family History  Problem Relation Age of Onset   Other Mother        lost leg, has trouble swallowing   Thyroid nodules Mother    Heart attack Father    Coronary artery disease Father     Diabetes Father    Rheum arthritis Father    Colon cancer Paternal Uncle 6   Colon cancer Paternal Grandmother 54   Diabetes Paternal Grandmother    Cancer Paternal Grandmother        breast,lung   Cancer Maternal Grandmother 73       kind unknown   Diabetes Maternal Grandmother    Myasthenia gravis Maternal Grandfather    Other Sister        tumor on ovary   ADD / ADHD Son    Heart attack Paternal Grandfather    Multiple sclerosis Other        paternal great aunt   Breast cancer Neg Hx     Social History   Tobacco Use   Smoking status: Former    Packs/day: 1.00    Years: 20.00    Pack years: 20.00    Types: Cigarettes    Quit date: 02/15/2012    Years since quitting: 9.5   Smokeless tobacco: Never  Vaping Use   Vaping Use: Never used  Substance Use Topics   Alcohol use: No    Alcohol/week: 0.0 standard drinks   Drug use: No    Home Medications Prior to Admission medications   Medication Sig Start Date End Date Taking? Authorizing Provider  ALPRAZolam (XANAX) 1 MG tablet TAKE ONE-HALF TABLET BY MOUTH TWICE A DAY AS NEEDED FOR ANXIETY Patient taking differently: Take 1 mg by mouth 2 (two) times daily as needed for anxiety. TAKE ONE-HALF TABLET BY MOUTH TWICE A DAY AS NEEDED FOR ANXIETY 08/15/21  Yes Ward Givens, NP  cephALEXin (KEFLEX) 500 MG capsule Take 1 capsule (500 mg total) by mouth 4 (four) times daily. 08/27/21  Yes Isla Pence, MD  chlorthalidone (HYGROTON) 25 MG tablet 25 mg daily.  12/11/17  Yes [provider]  HYDROcodone-acetaminophen (NORCO/VICODIN) 5-325 MG tablet Take 1 tablet by mouth every 4 (four) hours as needed. 08/27/21  Yes Isla Pence, MD  levocetirizine (XYZAL) 5 MG tablet Take 5 mg by mouth every evening.   Yes [provider]  magnesium oxide (MAG-OX) 400 MG tablet Take 400 mg by mouth daily.   Yes [provider]  pramipexole (MIRAPEX) 0.25 MG tablet TAKE ONE TABLET BY MOUTH AT [facility] AND ONE TABLET AT  BEDTIME 11/23/20  Yes [place], [name], NP  sertraline (ZOLOFT) 25 MG tablet TAKE ONE TABLET (25MG  TOTAL) BY MOUTH ATBEDTIME 11/23/20  Yes Ward Givens, NP  vitamin C (ASCORBIC ACID) 500 MG tablet Take 1 tablet by mouth daily. 02/04/18  Yes [provider]  ZEPOSIA 0.92 MG CAPS TAKE 1 CAPSULE (0.92MG ) BY  MOUTH DAILY  Patient taking differently: Take 1 capsule by mouth daily. 06/07/21  Yes Ward Givens, NP  POTASSIUM CHLORIDE PO Take by mouth. Takes one tablet daily. Patient not taking: Reported on 08/27/2021    [provider]    Allergies    Diclofenac, Zofran [ondansetron hcl], and Propoxyphene n-acetaminophen  Review of Systems   Review of Systems  Respiratory:  Positive for shortness of breath.   Cardiovascular:  Positive for chest pain.  All other systems reviewed and are negative.  Physical Exam Updated Vital Signs BP 112/80   Pulse 63   Temp 98.1 F (36.7 C) (Oral)   Resp 14   Ht 5\' 8"  (1.727 m)   Wt 113 kg   SpO2 98%   BMI 37.88 kg/m   Physical Exam Vitals and nursing note reviewed.  Constitutional:      Appearance: She is well-developed.  HENT:     Head: Normocephalic and atraumatic.  Eyes:     Extraocular Movements: Extraocular movements intact.     Pupils: Pupils are equal, round, and reactive to light.  Cardiovascular:     Rate and Rhythm: Normal rate and regular rhythm.     Heart sounds: Normal heart sounds.  Pulmonary:     Effort: Pulmonary effort is normal.     Breath sounds: Normal breath sounds.  Chest:     Chest wall: Tenderness present.  Abdominal:     General: Bowel sounds are normal.     Palpations: Abdomen is soft.  Musculoskeletal:        General: Normal range of motion.     Cervical back: Normal range of motion and neck supple.  Skin:    General: Skin is warm.     Capillary Refill: Capillary refill takes less than 2 seconds.  Neurological:     General: No focal deficit present.     Mental Status: She is alert and  oriented to person, place, and time.  Psychiatric:        Mood and Affect: Mood normal.        Behavior: Behavior normal.    ED Results / Procedures / Treatments   Labs (all labs ordered are listed, but only abnormal results are displayed) Labs Reviewed  URINALYSIS, ROUTINE W REFLEX MICROSCOPIC - Abnormal; Notable for the following components:      Result Value   APPearance CLOUDY (*)    Hgb urine dipstick MODERATE (*)    Nitrite POSITIVE (*)    Leukocytes,Ua MODERATE (*)    Bacteria, UA MANY (*)    All other components within normal limits  RESP PANEL BY RT-PCR (FLU A&B, COVID) ARPGX2  URINE CULTURE  BASIC METABOLIC PANEL  CBC  HEPATIC FUNCTION PANEL  D-DIMER, QUANTITATIVE  POC URINE PREG, ED  TROPONIN I (HIGH SENSITIVITY)  TROPONIN I (HIGH SENSITIVITY)    EKG EKG Interpretation  Date/Time:  Saturday August 27 2021 15:29:49 EDT Ventricular Rate:  64 PR Interval:  160 QRS Duration: 105 QT Interval:  433 QTC Calculation: 447 R Axis:   29 Text Interpretation: Sinus rhythm Low voltage, precordial leads RSR' in V1 or V2, probably normal variant No significant change since last tracing Confirmed by Isla Pence [phone]) on 08/27/2021 4:04:23 PM  Radiology DG Chest Port 1 View  Result Date: 08/27/2021 CLINICAL DATA:  Chest pain short of breath EXAM: PORTABLE CHEST 1 VIEW COMPARISON:  06/03/2017 FINDINGS: The heart size and mediastinal contours are within normal limits. Both lungs are clear. The visualized skeletal structures are unremarkable.  IMPRESSION: No active disease. Electronically Signed   By: Franchot Gallo M.D.   On: 08/27/2021 17:05    Procedures Procedures   Medications Ordered in ED Medications  cephALEXin (KEFLEX) capsule 500 mg (has no administration in time range)  alum & mag hydroxide-simeth (MAALOX/MYLANTA) 200-200-20 MG/5ML suspension 30 mL (30 mLs Oral Given 08/27/21 1552)    ED Course  I have reviewed the triage vital signs and the nursing  notes.  Pertinent labs & imaging results that were available during my care of the patient were reviewed by me and considered in my medical decision making (see chart for details).    MDM Rules/Calculators/A&P                           Pt's cardiac work up is negative.  She has a HEART score of 1 for family hx.  Ddimer is negative.  Cp is likely msk.  Pt does have a UTI.  She said she self caths because of MS.  No recent urine cultures.  Urine will be sent for a culture today.    Pt is stable for d/c.  Return if worse.  F/u with pcp. Final Clinical Impression(s) / ED Diagnoses Final diagnoses:  Atypical chest pain  Acute cystitis without hematuria  Self-catheterizes urinary bladder    Rx / DC Orders ED Discharge Orders          Ordered    HYDROcodone-acetaminophen (NORCO/VICODIN) 5-325 MG tablet  Every 4 hours PRN        08/27/21 1738    cephALEXin (KEFLEX) 500 MG capsule  4 times daily        08/27/21 1741             Isla Pence, MD 08/27/21 1745

## 2021-08-27 NOTE — ED Notes (Signed)
Patient is being discharged from the Urgent Care and sent to the Emergency Department via POV . Per Vanessa Kick MD , patient is in need of higher level of care due to Chest pain. Patient is aware and verbalizes understanding of plan of care.  Vitals:   08/27/21 1415  BP: 116/79  Pulse: 70  Resp: 17  Temp: 98 F (36.7 C)  SpO2: 97%

## 2021-08-27 NOTE — ED Triage Notes (Signed)
Pt is present with chest pain, SOB, shoulder blade pain, sweating, HA. Pt states sx started last night.

## 2021-08-27 NOTE — ED Triage Notes (Signed)
Pt is present with chest pain, SOB, shoulder blade pain, sweating, HA. Pt states sx started last night

## 2021-08-29 LAB — URINE CULTURE

## 2021-08-29 NOTE — ED Provider Notes (Signed)
[place]   [id number] 08/27/21 Arrival Time: 7846  ASSESSMENT & PLAN:  1. Chest pain, unspecified type   2. SOB (shortness of breath)     ECG without worrisome signs; NSR; no STEMI. Discussed that I cannot r/o ACS here. Recommend ED eval.  She agrees; to ED via private vehicle; stable upon d/c.   Chest pain precautions given. Reviewed expectations re: course of current medical issues. Questions answered. Outlined signs and symptoms indicating need for more acute intervention. Patient verbalized understanding. After Visit Summary given.   SUBJECTIVE:  History from: patient. AINSLEY DEAKINS is a 40 y.o. female who presents with complaint of SOB and CP. Describes as something sitting on her chest; still present at times today. Does report diaphoresis last evening during untimed episode; none today. No h/o similar. Denies: irregular heart beat, lower extremity edema, near-syncope, orthopnea, palpitations, paroxysmal nocturnal dyspnea, and syncope. No recent illnesses. Ambulatory without assistance. Self/OTC treatment: none. Recreational drug use: denied.  Social History   Tobacco Use  Smoking Status Former   Packs/day: 1.00   Years: 20.00   Pack years: 20.00   Types: Cigarettes   Quit date: 02/15/2012   Years since quitting: 9.5  Smokeless Tobacco Never   Social History   Substance and Sexual Activity  Alcohol Use No   Alcohol/week: 0.0 standard drinks    OBJECTIVE:  Vitals:   08/27/21 1415  BP: 116/79  Pulse: 70  Resp: 17  Temp: 98 F (36.7 C)  SpO2: 97%    General appearance: alert, oriented, no acute distress Eyes: PERRLA; EOMI; conjunctivae normal HENT: normocephalic; atraumatic Neck: supple with FROM Lungs: without labored respirations; speaks full sentences without difficulty; CTAB Heart: regular rate and rhythm without murmer Chest Wall: without tenderness to palpation Abdomen: soft, non-tender; no guarding or rebound  tenderness Extremities: without edema; without calf swelling or tenderness; symmetrical without gross deformities Skin: warm and dry; without rash or lesions Neuro: normal gait Psychological: alert and cooperative; normal mood and affect   Allergies  Allergen Reactions   Diclofenac Anaphylaxis   Zofran [Ondansetron Hcl] Other (See Comments)    Prolonged QT - unable to have zofran per report   Propoxyphene N-Acetaminophen Rash    Past Medical History:  Diagnosis Date   Anxiety    Depression    Enlarged heart    Fibroid 04/24/2016   GERD (gastroesophageal reflux disease)    Leaky heart valve    Menorrhagia with irregular cycle 04/13/2016   Multiple sclerosis ([place])    Dr Lonzo Cloud   Multiple sclerosis exacerbation ([facility])    Numbness in right leg 02/13,12/12   Obesity (BMI 35.0-39.9 without comorbidity)    Other malaise and fatigue 10/20/2013   Ovarian cyst 04/24/2016   Regurgitation    cardiac valve. echo 2013 for first eval GILENYA,mod MR,TR   Social History   Socioeconomic History   Marital status: Divorced    Spouse name: Divorced    Number of children: 1   Years of education: college   Highest education level: Not on file  Occupational History   Occupation: disabled  Tobacco Use   Smoking status: Former    Packs/day: 1.00    Years: 20.00    Pack years: 20.00    Types: Cigarettes    Quit date: 02/15/2012    Years since quitting: 9.5   Smokeless tobacco: Never  Vaping Use   Vaping Use: Never used  Substance and Sexual Activity   Alcohol use: No  Alcohol/week: 0.0 standard drinks   Drug use: No   Sexual activity: Never    Birth control/protection: None  Other Topics Concern   Not on file  Social History Narrative   Patient is divorced and lives at home and he son lives with her.   Patient has one child.   Patient is disabled.   Patient has some college education.   Patient is right-handed.   Patient drinks 3 servings of caffeine daily      Social  Determinants of Health   Financial Resource Strain: Not on file  Food Insecurity: Not on file  Transportation Needs: Not on file  Physical Activity: Not on file  Stress: Not on file  Social Connections: Not on file  Intimate Partner Violence: Not on file   Family History  Problem Relation Age of Onset   Other Mother        lost leg, has trouble swallowing   Thyroid nodules Mother    Heart attack Father    Coronary artery disease Father    Diabetes Father    Rheum arthritis Father    Colon cancer Paternal Uncle 40   Colon cancer Paternal Grandmother 59   Diabetes Paternal Grandmother    Cancer Paternal Grandmother        breast,lung   Cancer Maternal Grandmother 76       kind unknown   Diabetes Maternal Grandmother    Myasthenia gravis Maternal Grandfather    Other Sister        tumor on ovary   ADD / ADHD Son    Heart attack Paternal Grandfather    Multiple sclerosis Other        paternal great aunt   Breast cancer Neg Hx    Past Surgical History:  Procedure Laterality Date   BACK SURGERY  2006   CESAREAN SECTION  2000   CHOLECYSTECTOMY  2010   COLONOSCOPY  09/14/2010   Fields-hyperplastic polyps, int hemorrhoids   CYST EXCISION  2006   pilonoidal   DILITATION & CURRETTAGE/HYSTROSCOPY WITH NOVASURE ABLATION N/A 07/21/2016   Procedure: DILATATION & CURETTAGE/HYSTEROSCOPY WITH NOVASURE ABLATION;  Surgeon: Jonnie Kind, MD;  Location: [facility];  Service: Gynecology;  Laterality: N/A;  Novasure lenght - 5.0 width - 4.1 power - 113 time - 2 min 03 seconds   ESOPHAGOGASTRODUODENOSCOPY  08/05/2008   Fields-incomplete Schatzki's ring, distal esophageal erosions/esophagitis(mild), small HH, NEGATIVE h pylori   LAPAROSCOPIC APPENDECTOMY N/A 03/26/2018   Procedure: APPENDECTOMY LAPAROSCOPIC;  Surgeon: Aviva Signs, MD;  Location: [facility];  Service: General;  Laterality: N/A;   LAPAROSCOPIC BILATERAL SALPINGECTOMY Bilateral 07/21/2016   Procedure: LAPAROSCOPIC RIGHT  SALPINGECTOMY AND LEFT OOPHECTOMY;  Surgeon: Jonnie Kind, MD;  Location: [facility];  Service: Gynecology;  Laterality: Bilateral;   LUMBAR DISC SURGERY     US ECHOCARDIOGRAPHY  03/19/2012   RV mildly dilated,RA mod. dilated,strial septum aneurysmal,Mod. MR & TR      Vanessa Kick, MD 08/29/21 [phone]

## 2021-09-01 DIAGNOSIS — G35 Multiple sclerosis: Secondary | ICD-10-CM | POA: Diagnosis not present

## 2021-09-01 DIAGNOSIS — I10 Essential (primary) hypertension: Secondary | ICD-10-CM | POA: Diagnosis not present

## 2021-09-01 DIAGNOSIS — Z299 Encounter for prophylactic measures, unspecified: Secondary | ICD-10-CM | POA: Diagnosis not present

## 2021-09-12 DIAGNOSIS — M47816 Spondylosis without myelopathy or radiculopathy, lumbar region: Secondary | ICD-10-CM | POA: Diagnosis not present

## 2021-09-29 ENCOUNTER — Ambulatory Visit: Payer: Medicare Other | Admitting: Cardiovascular Disease

## 2021-10-03 DIAGNOSIS — R339 Retention of urine, unspecified: Secondary | ICD-10-CM | POA: Diagnosis not present

## 2021-10-13 ENCOUNTER — Other Ambulatory Visit: Payer: Self-pay

## 2021-10-13 ENCOUNTER — Ambulatory Visit (INDEPENDENT_AMBULATORY_CARE_PROVIDER_SITE_OTHER): Payer: Medicare Other | Admitting: Cardiovascular Disease

## 2021-10-13 ENCOUNTER — Encounter: Payer: Self-pay | Admitting: Cardiovascular Disease

## 2021-10-13 VITALS — BP 120/72 | HR 73 | Ht 68.0 in | Wt 265.0 lb

## 2021-10-13 DIAGNOSIS — I071 Rheumatic tricuspid insufficiency: Secondary | ICD-10-CM

## 2021-10-13 DIAGNOSIS — R072 Precordial pain: Secondary | ICD-10-CM

## 2021-10-13 MED ORDER — METOPROLOL TARTRATE 100 MG PO TABS: ORAL_TABLET | ORAL | 0 refills | Status: DC

## 2021-10-13 NOTE — Patient Instructions (Addendum)
Medication Instructions:  No changes *If you need a refill on your cardiac medications before your next appointment, please call your pharmacy*   Lab Work: None ordered If you have labs (blood work) drawn today and your tests are completely normal, you will receive your results only by: [place] (if you have MyChart) OR A paper copy in the mail If you have any lab test that is abnormal or we need to change your treatment, we will call you to review the results.   Testing/Procedures: Your physician has requested that you have an echocardiogram. Echocardiography is a painless test that uses sound waves to create images of your heart. It provides your doctor with information about the size and shape of your heart and how well your hearts chambers and valves are working. You may receive an ultrasound enhancing agent through an IV if needed to better visualize your heart during the echo.This procedure takes approximately one hour. There are no restrictions for this procedure. This will take place at the [address], [address].     Follow-Up: At [facility], you and your health needs are our priority.  As part of our continuing mission to provide you with exceptional heart care, we have created designated Provider Care Teams.  These Care Teams include your primary Cardiologist (physician) and Advanced Practice Providers (APPs -  Physician Assistants and Nurse Practitioners) who all work together to provide you with the care you need, when you need it.  We recommend signing up for the patient portal called "MyChart".  Sign up information is provided on this After Visit Summary.  MyChart is used to connect with patients for Virtual Visits (Telemedicine).  Patients are able to view lab/test results, encounter notes, upcoming appointments, etc.  Non-urgent messages can be sent to your provider as well.   To learn more about what you can do with MyChart, go to [email or web address].     Your next appointment:   Follow up with Dr. Sallyanne Kuster after the testing  Other Instructions   Your cardiac CT will be scheduled at one of the below locations:   [facility] [address] [place], [place] [address] [phone]  [place] [address] [place], [place] [address] [phone]  If scheduled at [facility], please arrive at the [facility] main entrance (entrance A) of [facility] 30 minutes prior to test start time. You can use the FREE valet parking offered at the main entrance (encouraged to control the heart rate for the test) Proceed to the [facility] Radiology Department (first floor) to check-in and test prep.  If scheduled at [facility], please arrive 15 mins early for check-in and test prep.  Please follow these instructions carefully (unless otherwise directed):   On the Night Before the Test: Be sure to Drink plenty of water. Do not consume any caffeinated/decaffeinated beverages or chocolate 12 hours prior to your test. Do not take any antihistamines 12 hours prior to your test.  On the Day of the Test: Drink plenty of water until 1 hour prior to the test. Do not eat any food 4 hours prior to the test. You may take your regular medications prior to the test.  Take metoprolol (Lopressor) two hours prior to test. FEMALES- please wear underwire-free bra if available, avoid dresses & tight clothing Hold the chlorthalidone the morning of the test   After the Test: Drink plenty of  water. After receiving IV contrast, you may experience a mild flushed feeling. This is normal. On occasion, you may experience a mild rash up to 24 hours after the test. This is not dangerous. If this occurs, you can take Benadryl 25 mg and increase your fluid intake. If you experience trouble breathing, this can be serious. If it is severe call 911 IMMEDIATELY.  If it is mild, please call our office. If you take any of these medications: Glipizide/Metformin, Avandament, Glucavance, please do not take 48 hours after completing test unless otherwise instructed.  Please allow 2-4 weeks for scheduling of routine cardiac CTs. Some insurance companies require a pre-authorization which may delay scheduling of this test.   For non-scheduling related questions, please contact the cardiac imaging nurse navigator should you have any questions/concerns: Marchia Bond, Cardiac Imaging Nurse Navigator Gordy Clement, Cardiac Imaging Nurse Navigator [facility] Direct Office Dial: [phone]   For scheduling needs, including cancellations and rescheduling, please call [place], [phone].

## 2021-10-13 NOTE — Progress Notes (Signed)
Cardiology Office Note:    Date:  10/13/2021   ID:  [name], DOB 1981/06/01, MRN [id number]  PCP:  Minta Balsam, MD   [facility] Providers Cardiologist:  Sanda Klein, MD     Referring MD: Minta Balsam, MD   Chief Complaint  Patient presents with   Chest Pain  [name] is a 40 y.o. female who is being seen today for the evaluation of palpitations and chest pain at the request of Minta Balsam, MD.   History of Present Illness:    [name] is a 40 y.o. female former smoker with a hx of moderate tricuspid insufficiency, multiple sclerosis, morbid obesity, presents with complaints of chest discomfort.  She was seen in the emergency department on 08/27/2021 with an episode of severe retrosternal chest pressure ("300 pound man sitting on my chest") with onset at rest, lasting for about an hour, spontaneously resolved.  Symptoms have occurred again sporadically without any particular pattern.  They are never associated with physical activity.  During her emergency room evaluation she had a normal chest x-ray, undetectable D-dimer, undetectable troponin.  ECG did not show any high risk findings.  She has a longstanding history of problems with obesity.  Current BMI is over 40.  She has been prescribed chlorthalidone for lower extremity edema, but she has not taken any in 3 to 4 weeks and has a normal blood pressure.  She does not have diabetes mellitus.  I do not have a recent lipid profile, but in 01/08/15 she had an LDL of 50 and HDL of 47.  Her father Ernie Hew, who was also my patient, had a longstanding history of coronary disease with onset at a very young age.  He had a particularly low HDL cholesterol.  He died in the fall 08-Jan-2020 after COVID 19 infection, complicated by worsening respiratory failure.  She had unilateral oophorectomy at a young age, but only recently has begun experiencing perimenopausal hot flash symptoms.  Her last period was in 01-08-2016 when she was only 40  years old.  She started smoking at age 72 and smoked about a pack and a half of cigarettes a day for 20 years.  She quit in 01/08/2012  Due to sequelae of multiple sclerosis, she has to perform bladder self-catheterization.  She avoids drinking too much fluid because of this.  Past Medical History:  Diagnosis Date   Anxiety    Depression    Enlarged heart    Fibroid 04/24/2016   GERD (gastroesophageal reflux disease)    Leaky heart valve    Menorrhagia with irregular cycle 04/13/2016   Multiple sclerosis ([place])    Dr Lonzo Cloud   Multiple sclerosis exacerbation ([place])    Numbness in right leg 02/13,12/12   Obesity (BMI 35.0-39.9 without comorbidity)    Other malaise and fatigue 10/20/2013   Ovarian cyst 04/24/2016   Regurgitation    cardiac valve. echo 01/08/12 for first eval GILENYA,mod MR,TR    Past Surgical History:  Procedure Laterality Date   BACK SURGERY  01/07/2005   CESAREAN SECTION  1999-01-08   CHOLECYSTECTOMY  01/07/09   COLONOSCOPY  09/14/2010   Fields-hyperplastic polyps, int hemorrhoids   CYST EXCISION  Jan 07, 2005   pilonoidal   DILITATION & CURRETTAGE/HYSTROSCOPY WITH NOVASURE ABLATION N/A 07/21/2016   Procedure: DILATATION & CURETTAGE/HYSTEROSCOPY WITH NOVASURE ABLATION;  Surgeon: Jonnie Kind, MD;  Location: [facility];  Service: Gynecology;  Laterality: N/A;  Novasure lenght - 5.0 width - 4.1 power - 113  time - 2 min 03 seconds   ESOPHAGOGASTRODUODENOSCOPY  08/05/2008   Fields-incomplete Schatzki's ring, distal esophageal erosions/esophagitis(mild), small HH, NEGATIVE h pylori   LAPAROSCOPIC APPENDECTOMY N/A 03/26/2018   Procedure: APPENDECTOMY LAPAROSCOPIC;  Surgeon: Aviva Signs, MD;  Location: [facility];  Service: General;  Laterality: N/A;   LAPAROSCOPIC BILATERAL SALPINGECTOMY Bilateral 07/21/2016   Procedure: LAPAROSCOPIC RIGHT SALPINGECTOMY AND LEFT OOPHECTOMY;  Surgeon: Jonnie Kind, MD;  Location: [facility];  Service: Gynecology;  Laterality: Bilateral;   LUMBAR DISC SURGERY     US  ECHOCARDIOGRAPHY  03/19/2012   RV mildly dilated,RA mod. dilated,strial septum aneurysmal,Mod. MR & TR    Current Medications: Current Meds  Medication Sig   ALPRAZolam (XANAX) 1 MG tablet TAKE ONE-HALF TABLET BY MOUTH TWICE A DAY AS NEEDED FOR ANXIETY (Patient taking differently: Take 1 mg by mouth 2 (two) times daily as needed for anxiety. TAKE ONE-HALF TABLET BY MOUTH TWICE A DAY AS NEEDED FOR ANXIETY)   chlorthalidone (HYGROTON) 25 MG tablet 25 mg daily.    levocetirizine (XYZAL) 5 MG tablet Take 5 mg by mouth every evening.   magnesium oxide (MAG-OX) 400 MG tablet Take 400 mg by mouth daily.   metoprolol tartrate (LOPRESSOR) 100 MG tablet Take one tablet two hours prior to the test   pramipexole (MIRAPEX) 0.25 MG tablet TAKE ONE TABLET BY MOUTH AT DINNER AND ONE TABLET AT BEDTIME   sertraline (ZOLOFT) 100 MG tablet Take 100 mg by mouth daily.   tiZANidine (ZANAFLEX) 4 MG tablet Take 4 mg by mouth 2 (two) times daily as needed.   vitamin C (ASCORBIC ACID) 500 MG tablet Take 1 tablet by mouth daily.   ZEPOSIA 0.92 MG CAPS TAKE 1 CAPSULE (0.92MG ) BY  MOUTH DAILY (Patient taking differently: Take 1 capsule by mouth daily.)     Allergies:   Diclofenac, Zofran [ondansetron hcl], and Propoxyphene n-acetaminophen   Social History   Socioeconomic History   Marital status: Divorced    Spouse name: Divorced    Number of children: 1   Years of education: college   Highest education level: Not on file  Occupational History   Occupation: disabled  Tobacco Use   Smoking status: Former    Packs/day: 1.00    Years: 20.00    Pack years: 20.00    Types: Cigarettes    Quit date: 02/15/2012    Years since quitting: 9.6   Smokeless tobacco: Never  Vaping Use   Vaping Use: Never used  Substance and Sexual Activity   Alcohol use: No    Alcohol/week: 0.0 standard drinks   Drug use: No   Sexual activity: Never    Birth control/protection: None  Other Topics Concern   Not on file  Social  History Narrative   Patient is divorced and lives at home and he son lives with her.   Patient has one child.   Patient is disabled.   Patient has some college education.   Patient is right-handed.   Patient drinks 3 servings of caffeine daily      Social Determinants of Health   Financial Resource Strain: Not on file  Food Insecurity: Not on file  Transportation Needs: Not on file  Physical Activity: Not on file  Stress: Not on file  Social Connections: Not on file     Family History: The patient's family history includes ADD / ADHD in her son; Cancer in her paternal grandmother; Cancer (age of onset: 61) in her maternal grandmother; Colon cancer (age  of onset: 81) in her paternal uncle; Colon cancer (age of onset: 42) in her paternal grandmother; Coronary artery disease in her father; Diabetes in her father, maternal grandmother, and paternal grandmother; Heart attack in her father and paternal grandfather; Multiple sclerosis in an other family member; Myasthenia gravis in her maternal grandfather; Other in her mother and sister; Rheum arthritis in her father; Thyroid nodules in her mother. There is no history of Breast cancer.  ROS:   Please see the history of present illness.     All other systems reviewed and are negative.  EKGs/Labs/Other Studies Reviewed:    The following studies were reviewed today: Echocardiogram 04/23/2013 - Left ventricle: The cavity size was normal. There was mild    concentric hypertrophy. Systolic function was normal. The    estimated ejection fraction was in the range of 60% to    65%. Left ventricular diastolic function parameters were    normal.  - Mitral valve: Mildly thickened leaflets . Mild    regurgitation. Valve area by pressure half-time: 2.42cm^2.  - Left atrium: LA Volume/ BSA = 19.1 ml/m2 The atrium was    normal in size.  - Right ventricle: The cavity size was mildly dilated. The    moderator band was prominent. Systolic function  was    normal.  - Right atrium: The atrium was mildly dilated.  - Tricuspid valve: Poorly visualized. Moderate    regurgitation.  - Pulmonary arteries: The main pulmonary artery was    normal-sized. PA peak pressure: 34mm Hg (S).  - Inferior vena cava: The vessel was normal in size; the    respirophasic diameter changes were in the normal range (=    50%); findings are consistent with normal central venous    pressure.   EKG:  EKG is not ordered today.  The ekg ordered 08/27/2021 demonstrates sinus rhythm, generalized low voltage probably due to obesity, rSr' pattern in lead V1, incomplete right bundle branch block.  Recent Labs: 08/27/2021: ALT 23; BUN 14; Creatinine, Ser 0.58; Hemoglobin 14.2; Platelets 299; Potassium 4.0; Sodium 137  Recent Lipid Panel    Component Value Date/Time   CHOL 110 12/09/2014 1116   TRIG 64 12/09/2014 1116   HDL 47 12/09/2014 1116   CHOLHDL 2.3 12/09/2014 1116   LDLCALC 50 12/09/2014 1116     Risk Assessment/Calculations:           Physical Exam:    VS:  BP 120/72    Pulse 73    Ht 5\' 8"  (1.727 m)    Wt 265 lb (120.2 kg)    SpO2 95%    BMI 40.29 kg/m     Wt Readings from Last 3 Encounters:  10/13/21 265 lb (120.2 kg)  08/27/21 249 lb 1.9 oz (113 kg)  05/05/21 249 lb 2 oz (113 kg)     GEN: Morbidly obese well nourished, well developed in no acute distress HEENT: Normal NECK: No JVD; No carotid bruits LYMPHATICS: No lymphadenopathy CARDIAC: RRR, no murmurs, rubs, gallops RESPIRATORY:  Clear to auscultation without rales, wheezing or rhonchi  ABDOMEN: Soft, non-tender, non-distended MUSCULOSKELETAL:  No edema; No deformity  SKIN: Warm and dry NEUROLOGIC:  Alert and oriented x 3 PSYCHIATRIC:  Normal affect   ASSESSMENT:    1. Precordial pain   2. Tricuspid valve insufficiency, unspecified etiology    PLAN:    In order of problems listed above:  Chest pain: This was quite severe, but not associated with any high risk features on  ECG, cardiac enzymes, chest x-ray or D-dimer.  We will schedule for coronary CT angiography in view of the strong family history of premature CAD, early menopause, morbid obesity, history of smoking.  She had previous EGD with esophageal dilatation 2013.  If her coronary work-up is negative, this is a likely suspect for her symptoms Tricuspid regurgitation: Her murmur is not as obvious today as I recall it being several years ago.  We will follow-up with an echocardiogram.  Most likely etiology would be obstructive sleep apnea.           Medication Adjustments/Labs and Tests Ordered: Current medicines are reviewed at length with the patient today.  Concerns regarding medicines are outlined above.  Orders Placed This Encounter  Procedures   CT CORONARY MORPH W/CTA COR W/SCORE W/CA W/CM &/OR WO/CM   ECHOCARDIOGRAM COMPLETE   Meds ordered this encounter  Medications   metoprolol tartrate (LOPRESSOR) 100 MG tablet    Sig: Take one tablet two hours prior to the test    Dispense:  1 tablet    Refill:  0    Patient Instructions  Medication Instructions:  No changes *If you need a refill on your cardiac medications before your next appointment, please call your pharmacy*   Lab Work: None ordered If you have labs (blood work) drawn today and your tests are completely normal, you will receive your results only by: MyChart Message (if you have MyChart) OR A paper copy in the mail If you have any lab test that is abnormal or we need to change your treatment, we will call you to review the results.   Testing/Procedures: Your physician has requested that you have an echocardiogram. Echocardiography is a painless test that uses sound waves to create images of your heart. It provides your doctor with information about the size and shape of your heart and how well your hearts chambers and valves are working. You may receive an ultrasound enhancing agent through an IV if needed to better  visualize your heart during the echo.This procedure takes approximately one hour. There are no restrictions for this procedure. This will take place at the [address], [address].     Follow-Up: At [facility], you and your health needs are our priority.  As part of our continuing mission to provide you with exceptional heart care, we have created designated Provider Care Teams.  These Care Teams include your primary Cardiologist (physician) and Advanced Practice Providers (APPs -  Physician Assistants and Nurse Practitioners) who all work together to provide you with the care you need, when you need it.  We recommend signing up for the patient portal called "MyChart".  Sign up information is provided on this After Visit Summary.  MyChart is used to connect with patients for Virtual Visits (Telemedicine).  Patients are able to view lab/test results, encounter notes, upcoming appointments, etc.  Non-urgent messages can be sent to your provider as well.   To learn more about what you can do with MyChart, go to [email or web address].    Your next appointment:   Follow up with Dr. Sallyanne Kuster after the testing  Other Instructions   Your cardiac CT will be scheduled at one of the below locations:   [facility] [address] [place], [place] [address] [phone]  [place] [address] [place], [place] [address] [phone]  If scheduled at [facility], please arrive at the [facility] main  entrance (entrance A) of [facility] 30 minutes prior to test start time. You can use the FREE valet parking offered at the main entrance (encouraged to control the heart rate for the test) Proceed to the [facility] Radiology Department (first floor) to check-in and test prep.  If scheduled at [facility], please arrive 15 mins early for check-in and test prep.  Please follow these  instructions carefully (unless otherwise directed):   On the Night Before the Test: Be sure to Drink plenty of water. Do not consume any caffeinated/decaffeinated beverages or chocolate 12 hours prior to your test. Do not take any antihistamines 12 hours prior to your test.  On the Day of the Test: Drink plenty of water until 1 hour prior to the test. Do not eat any food 4 hours prior to the test. You may take your regular medications prior to the test.  Take metoprolol (Lopressor) two hours prior to test. FEMALES- please wear underwire-free bra if available, avoid dresses & tight clothing Hold the chlorthalidone the morning of the test   After the Test: Drink plenty of water. After receiving IV contrast, you may experience a mild flushed feeling. This is normal. On occasion, you may experience a mild rash up to 24 hours after the test. This is not dangerous. If this occurs, you can take Benadryl 25 mg and increase your fluid intake. If you experience trouble breathing, this can be serious. If it is severe call 911 IMMEDIATELY. If it is mild, please call our office. If you take any of these medications: Glipizide/Metformin, Avandament, Glucavance, please do not take 48 hours after completing test unless otherwise instructed.  Please allow 2-4 weeks for scheduling of routine cardiac CTs. Some insurance companies require a pre-authorization which may delay scheduling of this test.   For non-scheduling related questions, please contact the cardiac imaging nurse navigator should you have any questions/concerns: Marchia Bond, Cardiac Imaging Nurse Navigator Gordy Clement, Cardiac Imaging Nurse Navigator [facility] Direct Office Dial: [phone]   For scheduling needs, including cancellations and rescheduling, please call [place], [phone].     Signed, Sanda Klein, MD  10/13/2021 5:51 PM    [place]

## 2021-10-27 ENCOUNTER — Telehealth: Payer: Self-pay

## 2021-10-27 ENCOUNTER — Telehealth (HOSPITAL_COMMUNITY): Payer: Self-pay | Admitting: *Deleted

## 2021-10-27 NOTE — Telephone Encounter (Signed)
Reaching out to [name] to offer assistance regarding upcoming cardiac imaging study; pt verbalizes understanding of appt date/time, parking situation and where to check in, pre-test NPO status and medications ordered, and verified current allergies; name and call back number provided for further questions should they arise  Jessica Clement RN Navigator Cardiac Imaging [name] [phone] office [phone] cell  [name] to take 100mg  metoprolol tartrate two hours prior to cardiac CT scan. She is aware to arrive at 10:30am for her 11am scan.

## 2021-10-27 NOTE — Telephone Encounter (Signed)
Attempted PA for Zeposia and received this message. This medication or product was previously approved on [id number] from 2021-10-23 to 2022-10-22. **Please note: This request was submitted electronically. Formulary lowering, tiering exception, cost reduction and/or pre-benefit determination review (including prospective Medicare hospice reviews) requests cannot be requested using this method of submission. Providers contact us at [phone] for further assistance.

## 2021-10-31 ENCOUNTER — Ambulatory Visit (HOSPITAL_COMMUNITY)
Admission: RE | Admit: 2021-10-31 | Discharge: 2021-10-31 | Disposition: A | Discharge status: Home / Self Care | Payer: Medicare Other | Source: Ambulatory Visit | Attending: Cardiovascular Disease | Admitting: Cardiovascular Disease

## 2021-10-31 ENCOUNTER — Encounter (HOSPITAL_COMMUNITY): Payer: Self-pay

## 2021-10-31 ENCOUNTER — Other Ambulatory Visit: Payer: Self-pay

## 2021-10-31 DIAGNOSIS — R072 Precordial pain: Secondary | ICD-10-CM | POA: Insufficient documentation

## 2021-10-31 MED ORDER — IOHEXOL 350 MG/ML SOLN
95.0000 mL | Freq: Once | INTRAVENOUS | Status: AC | PRN
Start: 2021-10-31 — End: 2021-10-31
  Administered 2021-10-31: 95 mL via INTRAVENOUS

## 2021-10-31 MED ORDER — NITROGLYCERIN 0.4 MG SL SUBL
0.8000 mg | SUBLINGUAL_TABLET | Freq: Once | SUBLINGUAL | Status: AC
Start: 2021-10-31 — End: 2021-10-31

## 2021-10-31 MED ORDER — NITROGLYCERIN 0.4 MG SL SUBL
SUBLINGUAL_TABLET | SUBLINGUAL | Status: AC
  Administered 2021-10-31: 0.8 mg
  Filled 2021-10-31: qty 2

## 2021-11-01 ENCOUNTER — Other Ambulatory Visit: Payer: Self-pay | Admitting: *Deleted

## 2021-11-01 DIAGNOSIS — E785 Hyperlipidemia, unspecified: Secondary | ICD-10-CM

## 2021-11-01 NOTE — Telephone Encounter (Signed)
Zeposia 360 Support ([employer]) called check status of reauthorization for [employer].  Contact [phone], ext [phone]

## 2021-11-01 NOTE — Telephone Encounter (Signed)
I called [employer] back and LVM advising this medication had already been approved for the patient through 10/22/2022. I asked her to give [place] a call back if she is having issues filling this for the patient.

## 2021-11-03 DIAGNOSIS — G35 Multiple sclerosis: Secondary | ICD-10-CM | POA: Diagnosis not present

## 2021-11-03 DIAGNOSIS — M47816 Spondylosis without myelopathy or radiculopathy, lumbar region: Secondary | ICD-10-CM | POA: Diagnosis not present

## 2021-11-03 DIAGNOSIS — E785 Hyperlipidemia, unspecified: Secondary | ICD-10-CM | POA: Diagnosis not present

## 2021-11-03 DIAGNOSIS — M5136 Other intervertebral disc degeneration, lumbar region: Secondary | ICD-10-CM | POA: Diagnosis not present

## 2021-11-04 ENCOUNTER — Encounter: Payer: Self-pay | Admitting: Cardiovascular Disease

## 2021-11-04 DIAGNOSIS — E785 Hyperlipidemia, unspecified: Secondary | ICD-10-CM

## 2021-11-04 LAB — LIPID PANEL
Chol/HDL Ratio: 3.3 ratio (ref 0.0–4.4)
Cholesterol, Total: 166 mg/dL (ref 100–199)
HDL: 51 mg/dL (ref >39)
LDL Chol Calc (NIH): 95 mg/dL (ref 0–99)
Triglycerides: 108 mg/dL (ref 0–149)
VLDL Cholesterol Cal: 20 mg/dL (ref 5–40)

## 2021-11-04 MED ORDER — ROSUVASTATIN CALCIUM 10 MG PO TABS: 10.0000 mg | ORAL_TABLET | Freq: Every day | ORAL | 3 refills | Status: DC

## 2021-11-10 ENCOUNTER — Encounter: Payer: Self-pay | Admitting: Adult Health

## 2021-11-10 ENCOUNTER — Ambulatory Visit (INDEPENDENT_AMBULATORY_CARE_PROVIDER_SITE_OTHER): Payer: Medicare Other | Admitting: Adult Health

## 2021-11-10 ENCOUNTER — Other Ambulatory Visit: Payer: Self-pay

## 2021-11-10 VITALS — BP 134/84 | HR 64 | Ht 68.0 in | Wt 263.8 lb

## 2021-11-10 DIAGNOSIS — N390 Urinary tract infection, site not specified: Secondary | ICD-10-CM | POA: Diagnosis not present

## 2021-11-10 DIAGNOSIS — N319 Neuromuscular dysfunction of bladder, unspecified: Secondary | ICD-10-CM | POA: Diagnosis not present

## 2021-11-10 DIAGNOSIS — R339 Retention of urine, unspecified: Secondary | ICD-10-CM | POA: Diagnosis not present

## 2021-11-10 DIAGNOSIS — N318 Other neuromuscular dysfunction of bladder: Secondary | ICD-10-CM | POA: Diagnosis not present

## 2021-11-10 DIAGNOSIS — R338 Other retention of urine: Secondary | ICD-10-CM | POA: Diagnosis not present

## 2021-11-10 DIAGNOSIS — R3129 Other microscopic hematuria: Secondary | ICD-10-CM | POA: Diagnosis not present

## 2021-11-10 DIAGNOSIS — G35 Multiple sclerosis: Secondary | ICD-10-CM | POA: Diagnosis not present

## 2021-11-10 DIAGNOSIS — R300 Dysuria: Secondary | ICD-10-CM | POA: Diagnosis not present

## 2021-11-10 NOTE — Progress Notes (Signed)
PATIENT: [name] DOB: 1981-03-19  REASON FOR VISIT: follow up HISTORY FROM: patient Primary neurologist: Dr. Brett Fairy  HISTORY OF PRESENT ILLNESS: Today 11/10/21:  Ms. Pommier is a 41 year old female with a history of multiple sclerosis.  She returns today for follow-up.  She continues on Zeposia.  Reports that this continues to work well for her.  She states that she continues to have ongoing back pain that is managed by neurosurgery.  She did try a nerve block but it was not helpful.  Recently started on Flexeril and has found that beneficial.  She continues to see urology for chronic urinary tract infections and difficulty emptying her bladder.  She has to self cath up to 3-4 times a day.  They are thinking about putting in a bladder stimulator.  She reports that it will be compatible for MRI.  She denies any new numbness or tingling.  No changes with her gait or balance.  No changes with her vision.  Recently had blood work in November and reports that she had blood work this morning with her urologist.  05/05/21: Ms. Betsill is a 41 year old female with a history of multiple sclerosis.  She returns today for follow-up.  She has been started on Zeposia she states that since she started this medication her back pain has tripled.  Reports that she is always had low back pain.  We had back surgery in 2006.  However she has noticed that since she started this medication her pain has tripled.  She denies any new numbness or tingling.  No changes with her gait or balance.  No changes with the bowels or bladder-continues to self cath.  She returns today for an evaluation.  12/22/20: Ms. Rauth is a 41 year old female with a history of multiple sclerosis.  She returns today to discuss MRI results.  MRI of the brain did show progression  And an active demyelination.  The patient has been on Plegridy.  She has noticed some weakness in her right leg and right hand.   MRI brain with and without  contrast December 10, 2020:  IMPRESSION: New subcentimeter focus of enhancement in the right frontal periventricular white matter most consistent with active demyelination. Otherwise stable moderate burden of chronic demyelination. Stable parenchymal volume loss.  MRI cervical spine with and without contrast December 10, 2020:  IMPRESSION: Likely no substantial change in chronic demyelinating cord lesions. No definite new lesion or abnormal enhancement.    11/23/20: Ms. Guynes is a 41 year old female with a history of multiple sclerosis and restless leg syndrome.  She returns today for follow-up.  She remains on Plegridy.patient reports that since 08/12/2023 she has noticed more weakness in the right leg.  She states that she has been under a lot of stress as her father passed away in Aug 12, 2023 and she has had to move.  No changes with the bowels or bladder-continues to self cath.  She plans to follow-up with her urologist to discuss any medication options.  Reports that she has had minimal changes in her gait mainly due to increased weakness in the right leg..  Denies any changes in her vision.  Remains on Mirapex for restless leg.  She returns today for an evaluation.    Imaging 09/22/2019:  MRI cervical spine with and without contrast: IMPRESSION: This MRI of the cervical spine with and without contrast shows the following: 1.   Multiple T2 hyperintense foci within the spinal cord as detailed above consistent with chronic demyelinating  plaque associated with multiple sclerosis.  None of the foci enhanced.  Compared to the MRI dated 02/15/2017, there is no interval change. 2.   There is a normal enhancement pattern.  MRI brain with and without contrast:  IMPRESSION: This MRI of the brain with and without contrast shows the following: 1.   There are multiple T2/FLAIR hyperintense foci in the hemispheres, brainstem and left middle cerebellar peduncle in a pattern and configuration consistent with  chronic demyelinating plaque associated with multiple sclerosis.  None of the foci appears to be acute and they do not enhance.  Compared to the MRI dated 02/15/2017, there is no interval change. 2.   There are no acute findings and there is a normal enhancement pattern.  HISTORY 02/19/20: Ms. Sylvester is a 41 year old female with a history of multiple sclerosis and restless leg syndrome.  She returns today for follow-up.  She remains on Plegridy.  She did have a potential exacerbation in March.  She received IV Solu-Medrol with good benefit.  Reports that her symptoms have been stable since then.  She denies any new numbness or weakness.  Denies any changes with her gait or balance.  She continues to have the self cath.  Remains on Mirapex for restless leg.  She returns today for an evaluation.  HISTORY  08/20/19:   Ms. Christiana is a 41 year old female with a history of multiple sclerosis and restless leg syndrome.  She returns today for follow-up.  She continues on Plegridy.  She continues to tolerate this medication well.  She denies any new numbness or weakness.  Denies any changes with her gait or balance.  She is now having to self cath up to 3 times a day.  She has not seen Dr. Zigmund Daniel in 2 years.  She states that she keeps a urinary tract infection despite being on preventative medication.  No change in her vision.  Her last MRI of the brain and cervical spine was in 2018.  She reports that Mirapex has been beneficial for restless leg syndrome.  She continues to take 0.25 mg at dinnertime and at bedtime.  She returns today for evaluation.  REVIEW OF SYSTEMS: Out of a complete 14 system review of symptoms, the patient complains only of the following symptoms, and all other reviewed systems are negative.  See HPI  ALLERGIES: Allergies  Allergen Reactions   Diclofenac Anaphylaxis   Zofran [Ondansetron Hcl] Other (See Comments)    Prolonged QT - unable to have zofran per report   Propoxyphene  N-Acetaminophen Rash    HOME MEDICATIONS: Outpatient Medications Prior to Visit  Medication Sig Dispense Refill   ALPRAZolam (XANAX) 1 MG tablet TAKE ONE-HALF TABLET BY MOUTH TWICE A DAY AS NEEDED FOR ANXIETY (Patient taking differently: Take 1 mg by mouth 2 (two) times daily as needed for anxiety. TAKE ONE-HALF TABLET BY MOUTH TWICE A DAY AS NEEDED FOR ANXIETY) 30 tablet 5   chlorthalidone (HYGROTON) 25 MG tablet 25 mg daily.      levocetirizine (XYZAL) 5 MG tablet Take 5 mg by mouth every evening.     magnesium oxide (MAG-OX) 400 MG tablet Take 400 mg by mouth daily.     metoprolol tartrate (LOPRESSOR) 100 MG tablet Take one tablet two hours prior to the test 1 tablet 0   pramipexole (MIRAPEX) 0.25 MG tablet TAKE ONE TABLET BY MOUTH AT DINNER AND ONE TABLET AT BEDTIME 180 tablet 3   rosuvastatin (CRESTOR) 10 MG tablet Take 1 tablet (10 mg  total) by mouth daily. 90 tablet 3   sertraline (ZOLOFT) 100 MG tablet Take 100 mg by mouth daily.     tiZANidine (ZANAFLEX) 4 MG tablet Take 4 mg by mouth 2 (two) times daily as needed.     vitamin C (ASCORBIC ACID) 500 MG tablet Take 1 tablet by mouth daily.     ZEPOSIA 0.92 MG CAPS TAKE 1 CAPSULE (0.92MG ) BY  MOUTH DAILY (Patient taking differently: Take 1 capsule by mouth daily.) 90 capsule 1   No facility-administered medications prior to visit.    PAST MEDICAL HISTORY: Past Medical History:  Diagnosis Date   Anxiety    Depression    Enlarged heart    Fibroid 04/24/2016   GERD (gastroesophageal reflux disease)    Leaky heart valve    Menorrhagia with irregular cycle 04/13/2016   Multiple sclerosis ([place])    Dr Lonzo Cloud   Multiple sclerosis exacerbation ([place])    Numbness in right leg 02/13,12/12   Obesity (BMI 35.0-39.9 without comorbidity)    Other malaise and fatigue 10/20/2013   Ovarian cyst 04/24/2016   Regurgitation    cardiac valve. echo 2013 for first eval GILENYA,mod MR,TR    PAST SURGICAL HISTORY: Past Surgical History:   Procedure Laterality Date   BACK SURGERY  2006   CESAREAN SECTION  2000   CHOLECYSTECTOMY  2010   COLONOSCOPY  09/14/2010   Fields-hyperplastic polyps, int hemorrhoids   CYST EXCISION  2006   pilonoidal   DILITATION & CURRETTAGE/HYSTROSCOPY WITH NOVASURE ABLATION N/A 07/21/2016   Procedure: DILATATION & CURETTAGE/HYSTEROSCOPY WITH NOVASURE ABLATION;  Surgeon: Jonnie Kind, MD;  Location: [facility];  Service: Gynecology;  Laterality: N/A;  Novasure lenght - 5.0 width - 4.1 power - 113 time - 2 min 03 seconds   ESOPHAGOGASTRODUODENOSCOPY  08/05/2008   Fields-incomplete Schatzki's ring, distal esophageal erosions/esophagitis(mild), small HH, NEGATIVE h pylori   LAPAROSCOPIC APPENDECTOMY N/A 03/26/2018   Procedure: APPENDECTOMY LAPAROSCOPIC;  Surgeon: Aviva Signs, MD;  Location: [facility];  Service: General;  Laterality: N/A;   LAPAROSCOPIC BILATERAL SALPINGECTOMY Bilateral 07/21/2016   Procedure: LAPAROSCOPIC RIGHT SALPINGECTOMY AND LEFT OOPHECTOMY;  Surgeon: Jonnie Kind, MD;  Location: [facility];  Service: Gynecology;  Laterality: Bilateral;   LUMBAR DISC SURGERY     US ECHOCARDIOGRAPHY  03/19/2012   RV mildly dilated,RA mod. dilated,strial septum aneurysmal,Mod. MR & TR    FAMILY HISTORY: Family History  Problem Relation Age of Onset   Other Mother        lost leg, has trouble swallowing   Thyroid nodules Mother    Heart attack Father    Coronary artery disease Father    Diabetes Father    Rheum arthritis Father    Colon cancer Paternal Uncle 60   Colon cancer Paternal Grandmother 12   Diabetes Paternal Grandmother    Cancer Paternal Grandmother        breast,lung   Cancer Maternal Grandmother 80       kind unknown   Diabetes Maternal Grandmother    Myasthenia gravis Maternal Grandfather    Other Sister        tumor on ovary   ADD / ADHD Son    Heart attack Paternal Grandfather    Multiple sclerosis Other        paternal great aunt   Breast cancer Neg Hx     SOCIAL  HISTORY: Social History   Socioeconomic History   Marital status: Divorced    Spouse name: Divorced  Number of children: 1   Years of education: college   Highest education level: Not on file  Occupational History   Occupation: disabled  Tobacco Use   Smoking status: Former    Packs/day: 1.00    Years: 20.00    Pack years: 20.00    Types: Cigarettes    Quit date: 02/15/2012    Years since quitting: 9.7   Smokeless tobacco: Never  Vaping Use   Vaping Use: Never used  Substance and Sexual Activity   Alcohol use: No    Alcohol/week: 0.0 standard drinks   Drug use: No   Sexual activity: Never    Birth control/protection: None  Other Topics Concern   Not on file  Social History Narrative   Patient is divorced and lives at home and he son lives with her.   Patient has one child.   Patient is disabled.   Patient has some college education.   Patient is right-handed.   Patient drinks 3 servings of caffeine daily      Social Determinants of Health   Financial Resource Strain: Not on file  Food Insecurity: Not on file  Transportation Needs: Not on file  Physical Activity: Not on file  Stress: Not on file  Social Connections: Not on file  Intimate Partner Violence: Not on file      PHYSICAL EXAM  There were no vitals filed for this visit.  There is no height or weight on file to calculate BMI.  Generalized: Well developed, in no acute distress   Neurological examination  Mentation: Alert oriented to time, place, history taking. Follows all commands speech and language fluent Cranial nerve II-XII: . Extraocular movements were full, visual field were full on confrontational test. . Head turning and shoulder shrug  were normal and symmetric. Motor: The motor testing reveals 5 over 5 strength of all 4 extremities with the exception of slightly weaker in the right lower extremity.  Good symmetric motor tone is noted throughout.  Sensory: Sensory testing is intact to  soft touch on all 4 extremities. No evidence of extinction is noted.  Coordination: Cerebellar testing reveals good finger-nose-finger and heel-to-shin bilaterally.  Gait and station: Patient has a slight limp when ambulating. Reflexes: Deep tendon reflexes are symmetric and normal bilaterally.   DIAGNOSTIC DATA (LABS, IMAGING, TESTING) - I reviewed patient records, labs, notes, testing and imaging myself where available.  Lab Results  Component Value Date   WBC 9.0 08/27/2021   HGB 14.2 08/27/2021   HCT 42.4 08/27/2021   MCV 88.3 08/27/2021   PLT 299 08/27/2021      Component Value Date/Time   NA 137 08/27/2021 1508   NA 140 05/05/2021 1405   K 4.0 08/27/2021 1508   K 4.2 02/28/2012 1113   CL 103 08/27/2021 1508   CL 107 02/28/2012 1113   CO2 28 08/27/2021 1508   CO2 23 02/28/2012 1113   GLUCOSE 92 08/27/2021 1508   BUN 14 08/27/2021 1508   BUN 12 05/05/2021 1405   CREATININE 0.58 08/27/2021 1508   CREATININE 0.73 02/28/2012 1113   CALCIUM 8.9 08/27/2021 1508   CALCIUM 9.5 02/28/2012 1113   PROT 6.6 08/27/2021 1508   PROT 6.5 05/05/2021 1405   PROT 6.4 02/28/2012 1113   ALBUMIN 3.7 08/27/2021 1508   ALBUMIN 4.3 05/05/2021 1405   AST 17 08/27/2021 1508   AST 14 02/28/2012 1113   ALT 23 08/27/2021 1508   ALKPHOS 43 08/27/2021 1508   ALKPHOS 49 02/28/2012  1113   BILITOT 0.6 08/27/2021 1508   BILITOT 0.2 05/05/2021 1405   GFRNONAA >60 08/27/2021 1508   GFRAA 122 11/23/2020 1512   Lab Results  Component Value Date   CHOL 166 11/03/2021   HDL 51 11/03/2021   LDLCALC 95 11/03/2021   TRIG 108 11/03/2021   CHOLHDL 3.3 11/03/2021    Lab Results  Component Value Date   TSH 3.700 12/09/2014      ASSESSMENT AND PLAN 41 y.o. year old female  has a past medical history of Anxiety, Depression, Enlarged heart, Fibroid (04/24/2016), GERD (gastroesophageal reflux disease), Leaky heart valve, Menorrhagia with irregular cycle (04/13/2016), Multiple sclerosis ([place]), Multiple  sclerosis exacerbation ([place]), Numbness in right leg (02/13,12/12), Obesity (BMI 35.0-39.9 without comorbidity), Other malaise and fatigue (10/20/2013), Ovarian cyst (04/24/2016), and Regurgitation. here with:  1.  Multiple sclerosis   - Continue Zeposia - MRI brain and cervical spine with and without contrast -Reviewed blood work in epic -Follow-up in 6 months months or sooner if needed  Advised if symptoms worsen or she develops new symptoms she should let us know follow-up in 6 months or sooner if needed   Ward Givens, MSN, NP-C 11/10/2021, 12:44 PM [facility] [address] [place], [place] [address] [phone]

## 2021-11-10 NOTE — Patient Instructions (Signed)
Your Plan:  Continue Zeposia MRI brain and cervical spine If your symptoms worsen or you develop new symptoms please let us know.   Thank you for coming to see [place] at [facility]. I hope we have been able to provide you high quality care today.  You may receive a patient satisfaction survey over the next few weeks. We would appreciate your feedback and comments so that we may continue to improve ourselves and the health of our patients.

## 2021-11-11 ENCOUNTER — Ambulatory Visit (HOSPITAL_COMMUNITY): Payer: Medicare Other | Attending: Cardiovascular Disease

## 2021-11-11 DIAGNOSIS — I071 Rheumatic tricuspid insufficiency: Secondary | ICD-10-CM | POA: Insufficient documentation

## 2021-11-11 LAB — ECHOCARDIOGRAM COMPLETE
Area-P 1/2: 3.08 cm2
S' Lateral: 3.2 cm

## 2021-11-22 DIAGNOSIS — I10 Essential (primary) hypertension: Secondary | ICD-10-CM | POA: Diagnosis not present

## 2021-11-23 ENCOUNTER — Other Ambulatory Visit: Payer: Self-pay

## 2021-11-23 ENCOUNTER — Encounter: Payer: Self-pay | Admitting: Cardiovascular Disease

## 2021-11-23 ENCOUNTER — Ambulatory Visit (INDEPENDENT_AMBULATORY_CARE_PROVIDER_SITE_OTHER): Payer: Medicare Other | Admitting: Cardiovascular Disease

## 2021-11-23 VITALS — BP 122/90 | HR 71 | Ht 68.0 in | Wt 259.8 lb

## 2021-11-23 DIAGNOSIS — I361 Nonrheumatic tricuspid (valve) insufficiency: Secondary | ICD-10-CM

## 2021-11-23 DIAGNOSIS — R931 Abnormal findings on diagnostic imaging of heart and coronary circulation: Secondary | ICD-10-CM | POA: Diagnosis not present

## 2021-11-23 DIAGNOSIS — I10 Essential (primary) hypertension: Secondary | ICD-10-CM | POA: Diagnosis not present

## 2021-11-23 DIAGNOSIS — I340 Nonrheumatic mitral (valve) insufficiency: Secondary | ICD-10-CM | POA: Diagnosis not present

## 2021-11-23 NOTE — Progress Notes (Signed)
Cardiology Office Note:    Date:  11/23/2021   ID:  [name], DOB 06/11/1981, MRN [id number]  PCP:  Minta Balsam, MD   [facility] Providers Cardiologist:  Sanda Klein, MD     Referring MD: Minta Balsam, MD   No chief complaint on file. [name] is a 41 y.o. female who is being seen today for the evaluation of palpitations and chest pain at the request of Minta Balsam, MD.   History of Present Illness:    [name] is a 41 y.o. female former smoker with a hx of moderate tricuspid insufficiency, multiple sclerosis, morbid obesity, presents with complaints of chest discomfort.  She was seen in the emergency department on 08/27/2021 with an episode of severe retrosternal chest pressure ("300 pound man sitting on my chest") with onset at rest, lasting for about an hour, spontaneously resolved.  Symptoms have occurred again sporadically without any particular pattern.  They are never associated with physical activity.  During her emergency room evaluation she had a normal chest x-ray, undetectable D-dimer, undetectable troponin.  ECG did not show any high risk findings.  She has a longstanding history of problems with obesity.  Current BMI is over 40.  She has been prescribed chlorthalidone for lower extremity edema, but she has not taken any in 3 to 4 weeks and has a normal blood pressure.  She does not have diabetes mellitus.  I do not have a recent lipid profile, but in 2015/02/05 she had an LDL of 50 and HDL of 47.  Her father Ernie Hew, who was also my patient, had a longstanding history of coronary disease with onset at a very young age.  He had a particularly low HDL cholesterol.  He died in the fall 2020-02-05 after COVID 19 infection, complicated by worsening respiratory failure.  She had unilateral oophorectomy at a young age, but only recently has begun experiencing perimenopausal hot flash symptoms.  Her last period was in 02-05-16 when she was only 41 years old.  She started  smoking at age 12 and smoked about a pack and a half of cigarettes a day for 20 years.  She quit in 2012-02-05  Due to sequelae of multiple sclerosis, she has to perform bladder self-catheterization.  She avoids drinking too much fluid because of this.  She returns today after undergoing an echocardiogram and a coronary CT angiogram.  The CT angiogram showed very little coronary calcium (although for a 41 year old woman any calcification is probably abnormal) and no evidence of any coronary stenosis.  The echocardiogram showed a dilated left atrium, mild-moderate mitral regurgitation, mild-moderate tricuspid insufficiency and a hypermobile atrial septum without obvious ASD.  Left ventricular systolic function was normal.  There were no regional wall motion normalities.  Past Medical History:  Diagnosis Date   Anxiety    Depression    Enlarged heart    Fibroid 04/24/2016   GERD (gastroesophageal reflux disease)    Leaky heart valve    Menorrhagia with irregular cycle 04/13/2016   Multiple sclerosis ([place])    Dr Lonzo Cloud   Multiple sclerosis exacerbation ([place])    Numbness in right leg 02/13,12/12   Obesity (BMI 35.0-39.9 without comorbidity)    Other malaise and fatigue 10/20/2013   Ovarian cyst 04/24/2016   Regurgitation    cardiac valve. echo 02-05-12 for first eval GILENYA,mod MR,TR    Past Surgical History:  Procedure Laterality Date   BACK SURGERY  Feb 04, 2005   [facility]  CHOLECYSTECTOMY  2010   COLONOSCOPY  09/14/2010   Fields-hyperplastic polyps, int hemorrhoids   CYST EXCISION  2006   pilonoidal   DILITATION & CURRETTAGE/HYSTROSCOPY WITH NOVASURE ABLATION N/A 07/21/2016   Procedure: DILATATION & CURETTAGE/HYSTEROSCOPY WITH NOVASURE ABLATION;  Surgeon: Jonnie Kind, MD;  Location: [facility];  Service: Gynecology;  Laterality: N/A;  Novasure lenght - 5.0 width - 4.1 power - 113 time - 2 min 03 seconds   ESOPHAGOGASTRODUODENOSCOPY  08/05/2008   Fields-incomplete Schatzki's  ring, distal esophageal erosions/esophagitis(mild), small HH, NEGATIVE h pylori   LAPAROSCOPIC APPENDECTOMY N/A 03/26/2018   Procedure: APPENDECTOMY LAPAROSCOPIC;  Surgeon: Aviva Signs, MD;  Location: [facility];  Service: General;  Laterality: N/A;   LAPAROSCOPIC BILATERAL SALPINGECTOMY Bilateral 07/21/2016   Procedure: LAPAROSCOPIC RIGHT SALPINGECTOMY AND LEFT OOPHECTOMY;  Surgeon: Jonnie Kind, MD;  Location: [facility];  Service: Gynecology;  Laterality: Bilateral;   LUMBAR DISC SURGERY     US ECHOCARDIOGRAPHY  03/19/2012   RV mildly dilated,RA mod. dilated,strial septum aneurysmal,Mod. MR & TR    Current Medications: Current Meds  Medication Sig   chlorthalidone (HYGROTON) 25 MG tablet 25 mg daily.    cyclobenzaprine (FLEXERIL) 10 MG tablet Take 10 mg by mouth 3 (three) times daily as needed for muscle spasms.   levocetirizine (XYZAL) 5 MG tablet Take 5 mg by mouth every evening.   magnesium oxide (MAG-OX) 400 MG tablet Take 400 mg by mouth daily.   methenamine (MANDELAMINE) 1 g tablet Take 1,000 mg by mouth 2 (two) times daily.   nitrofurantoin, macrocrystal-monohydrate, (MACROBID) 100 MG capsule Take 100 mg by mouth 2 (two) times daily.   pramipexole (MIRAPEX) 0.25 MG tablet TAKE ONE TABLET BY MOUTH AT DINNER AND ONE TABLET AT BEDTIME   rosuvastatin (CRESTOR) 10 MG tablet Take 1 tablet (10 mg total) by mouth daily.   sertraline (ZOLOFT) 100 MG tablet Take 100 mg by mouth daily.   vitamin C (ASCORBIC ACID) 500 MG tablet Take 1 tablet by mouth daily.   ZEPOSIA 0.92 MG CAPS TAKE 1 CAPSULE (0.92MG ) BY  MOUTH DAILY (Patient taking differently: Take 1 capsule by mouth daily.)     Allergies:   Diclofenac, Zofran [ondansetron hcl], and Propoxyphene n-acetaminophen   Social History   Socioeconomic History   Marital status: Divorced    Spouse name: Divorced    Number of children: 1   Years of education: college   Highest education level: Not on file  Occupational History   Occupation:  disabled  Tobacco Use   Smoking status: Former    Packs/day: 1.00    Years: 20.00    Pack years: 20.00    Types: Cigarettes    Quit date: 02/15/2012    Years since quitting: 9.7   Smokeless tobacco: Never  Vaping Use   Vaping Use: Never used  Substance and Sexual Activity   Alcohol use: No    Alcohol/week: 0.0 standard drinks   Drug use: No   Sexual activity: Never    Birth control/protection: None  Other Topics Concern   Not on file  Social History Narrative   Patient is divorced and lives at home and he son lives with her.   Patient has one child.   Patient is disabled.   Patient has some college education.   Patient is right-handed.   Patient drinks 3 servings of caffeine daily      Social Determinants of Health   Financial Resource Strain: Not on file  Food Insecurity: Not on file  Transportation Needs: Not on file  Physical Activity: Not on file  Stress: Not on file  Social Connections: Not on file     Family History: The patient's family history includes ADD / ADHD in her son; Cancer in her paternal grandmother; Cancer (age of onset: 50) in her maternal grandmother; Colon cancer (age of onset: 50) in her paternal uncle; Colon cancer (age of onset: 86) in her paternal grandmother; Coronary artery disease in her father; Diabetes in her father, maternal grandmother, and paternal grandmother; Heart attack in her father and paternal grandfather; Multiple sclerosis in an other family member; Myasthenia gravis in her maternal grandfather; Other in her mother and sister; Rheum arthritis in her father; Thyroid nodules in her mother. There is no history of Breast cancer.  ROS:   Please see the history of present illness.     All other systems reviewed and are negative.  EKGs/Labs/Other Studies Reviewed:    The following studies were reviewed today: Echocardiogram 11/11/2021    1. Left ventricular ejection fraction, by estimation, is 55%. The left  ventricle has normal  function. The left ventricle has no regional wall  motion abnormalities. Left ventricular diastolic parameters were normal.   2. Right ventricular systolic function is normal. The right ventricular  size is normal.   3. Left atrial size was mildly dilated.   4. Somewhat aneurysmal no obvious PFO.   5. Restricted posterior leaflet motion . The mitral valve is abnormal.  Mild to moderate mitral valve regurgitation. No evidence of mitral  stenosis.   6. Tricuspid valve regurgitation is mild to moderate.   7. The aortic valve is normal in structure. Aortic valve regurgitation is  not visualized. No aortic stenosis is present.   8. The inferior vena cava is normal in size with greater than 50%  respiratory variability, suggesting right atrial pressure of 3 mmHg.    CT coronary angiogram 10/31/2021 Coronary Calcium Score:   Left main: 0   Left anterior descending artery: 0   Left circumflex artery: 0   Right coronary artery: 0.381   Total: 0.381   Percentile: Unable to calculate.  Mesa risk score starts at age 22.   Other findings:   Normal pulmonary vein drainage into the left atrium.   Normal let atrial appendage without a thrombus.   Normal size of the pulmonary artery.   IMPRESSION: 1. Coronary calcium score of 0.381. Unable to calculate risk. Mesa risk score starts at age 52.   2. Normal coronary origin with right dominance.   3.  Minimal (<25%) calcified plaque in the R-PDA.  CAD-RADS 1. EKG:  EKG is not ordered today.  The ekg ordered 08/27/2021 demonstrates sinus rhythm, generalized low voltage probably due to obesity, rSr' pattern in lead V1, incomplete right bundle branch block.  Recent Labs: 08/27/2021: ALT 23; BUN 14; Creatinine, Ser 0.58; Hemoglobin 14.2; Platelets 299; Potassium 4.0; Sodium 137  Recent Lipid Panel    Component Value Date/Time   CHOL 166 11/03/2021 0803   TRIG 108 11/03/2021 0803   HDL 51 11/03/2021 0803   CHOLHDL 3.3 11/03/2021 0803    LDLCALC 95 11/03/2021 0803     Risk Assessment/Calculations:           Physical Exam:    VS:  BP 122/90 (BP Location: Left Arm, Patient Position: Sitting, Cuff Size: Large)    Pulse 71    Ht 5\' 8"  (1.727 m)    Wt 259 lb 12.8 oz (117.8 kg)  SpO2 96%    BMI 39.50 kg/m     Wt Readings from Last 3 Encounters:  11/23/21 259 lb 12.8 oz (117.8 kg)  11/10/21 263 lb 12.8 oz (119.7 kg)  10/13/21 265 lb (120.2 kg)     General: Alert, oriented x3, no distress, morbidly obese Head: no evidence of trauma, PERRL, EOMI, no exophtalmos or lid lag, no myxedema, no xanthelasma; normal ears, nose and oropharynx Neck: normal jugular venous pulsations and no hepatojugular reflux; brisk carotid pulses without delay and no carotid bruits Chest: clear to auscultation, no signs of consolidation by percussion or palpation, normal fremitus, symmetrical and full respiratory excursions Cardiovascular: normal position and quality of the apical impulse, regular rhythm, normal first and second heart sounds,/6 holosystolic murmur heard at the left lower sternal border and apex no diastolic murmurs, rubs or gallops Abdomen: no tenderness or distention, no masses by palpation, no abnormal pulsatility or arterial bruits, normal bowel sounds, no hepatosplenomegaly Extremities: no clubbing, cyanosis or edema; 2+ radial, ulnar and brachial pulses bilaterally; 2+ right femoral, posterior tibial and dorsalis pedis pulses; 2+ left femoral, posterior tibial and dorsalis pedis pulses; no subclavian or femoral bruits Neurological: grossly nonfocal Psych: Normal mood and affect   ASSESSMENT:    No diagnosis found.  PLAN:    In order of problems listed above:  Chest pain/coronary atherosclerosis: No evidence of any significant core obstruction on CT angiography.  Calcium score is barely above 0, and this is concerning since she is only 41 years old.  Would treat her LDL cholesterol aggressively, definitely less than 100,  preferably less than 70.   She had previous EGD with esophageal dilatation 2013, esophageal disease may explain her episode of chest pain. Mitral regurgitation: Mechanism is not immediately obvious, but it is only mild-moderate and she has a normal size left ventricle with normal LV systolic function.  We will evaluate every 3-5 years unless there is a change in symptoms. Tricuspid regurgitation: Her murmur is less obvious than it was a few years ago, right heart is not dilated, echo shows only mild-moderate tricuspid insufficiency.  Atrial septum is somewhat hypermobile, but there is no visible ASD.  Normal PA pressure.  No further evaluation planned at this time. HTN: Adequate control on monotherapy. Morbid obesity: BMI right at 40.  Would benefit from weight loss. Multiple sclerosis: Hopefully will have a successful trial with bladder stimulator and can stop self catheterizing after procedure later this week.           Medication Adjustments/Labs and Tests Ordered: Current medicines are reviewed at length with the patient today.  Concerns regarding medicines are outlined above.  No orders of the defined types were placed in this encounter.  No orders of the defined types were placed in this encounter.   There are no Patient Instructions on file for this visit.   Signed, Sanda Klein, MD  11/23/2021 1:42 PM    [facility]

## 2021-11-23 NOTE — Patient Instructions (Signed)

## 2021-11-25 ENCOUNTER — Ambulatory Visit
Admission: RE | Admit: 2021-11-25 | Discharge: 2021-11-25 | Disposition: A | Discharge status: Home / Self Care | Payer: Medicare Other | Source: Ambulatory Visit | Attending: Adult Health | Admitting: Adult Health

## 2021-11-25 ENCOUNTER — Other Ambulatory Visit: Payer: Self-pay

## 2021-11-25 DIAGNOSIS — G35 Multiple sclerosis: Secondary | ICD-10-CM

## 2021-11-25 MED ORDER — GADOBENATE DIMEGLUMINE 529 MG/ML IV SOLN
20.0000 mL | Freq: Once | INTRAVENOUS | Status: AC | PRN
  Administered 2021-11-25: 20 mL via INTRAVENOUS

## 2021-12-22 DIAGNOSIS — N3281 Overactive bladder: Secondary | ICD-10-CM | POA: Diagnosis not present

## 2021-12-22 DIAGNOSIS — R82998 Other abnormal findings in urine: Secondary | ICD-10-CM | POA: Diagnosis not present

## 2021-12-22 DIAGNOSIS — N318 Other neuromuscular dysfunction of bladder: Secondary | ICD-10-CM | POA: Diagnosis not present

## 2021-12-26 DIAGNOSIS — G35 Multiple sclerosis: Secondary | ICD-10-CM | POA: Diagnosis not present

## 2021-12-26 DIAGNOSIS — R338 Other retention of urine: Secondary | ICD-10-CM | POA: Diagnosis not present

## 2021-12-26 DIAGNOSIS — R300 Dysuria: Secondary | ICD-10-CM | POA: Diagnosis not present

## 2021-12-26 DIAGNOSIS — N318 Other neuromuscular dysfunction of bladder: Secondary | ICD-10-CM | POA: Diagnosis not present

## 2021-12-26 DIAGNOSIS — Z79899 Other long term (current) drug therapy: Secondary | ICD-10-CM | POA: Diagnosis not present

## 2021-12-26 DIAGNOSIS — N319 Neuromuscular dysfunction of bladder, unspecified: Secondary | ICD-10-CM | POA: Diagnosis not present

## 2021-12-26 DIAGNOSIS — R339 Retention of urine, unspecified: Secondary | ICD-10-CM | POA: Diagnosis not present

## 2021-12-29 DIAGNOSIS — M5136 Other intervertebral disc degeneration, lumbar region: Secondary | ICD-10-CM | POA: Diagnosis not present

## 2021-12-29 DIAGNOSIS — G35 Multiple sclerosis: Secondary | ICD-10-CM | POA: Diagnosis not present

## 2022-01-02 DIAGNOSIS — Z789 Other specified health status: Secondary | ICD-10-CM | POA: Insufficient documentation

## 2022-01-02 DIAGNOSIS — G35 Multiple sclerosis: Secondary | ICD-10-CM | POA: Diagnosis not present

## 2022-01-02 DIAGNOSIS — N3644 Muscular disorders of urethra: Secondary | ICD-10-CM | POA: Diagnosis not present

## 2022-01-02 DIAGNOSIS — Z09 Encounter for follow-up examination after completed treatment for conditions other than malignant neoplasm: Secondary | ICD-10-CM | POA: Diagnosis not present

## 2022-01-02 DIAGNOSIS — R339 Retention of urine, unspecified: Secondary | ICD-10-CM | POA: Diagnosis not present

## 2022-01-02 DIAGNOSIS — N319 Neuromuscular dysfunction of bladder, unspecified: Secondary | ICD-10-CM | POA: Diagnosis not present

## 2022-01-05 ENCOUNTER — Other Ambulatory Visit: Payer: Self-pay | Admitting: Internal Medicine

## 2022-01-05 ENCOUNTER — Ambulatory Visit
Admission: EM | Admit: 2022-01-05 | Discharge: 2022-01-05 | Disposition: A | Discharge status: Home / Self Care | Payer: Medicaid Other | Attending: Family Medicine | Admitting: Family Medicine

## 2022-01-05 ENCOUNTER — Ambulatory Visit (INDEPENDENT_AMBULATORY_CARE_PROVIDER_SITE_OTHER): Payer: Medicare Other

## 2022-01-05 ENCOUNTER — Other Ambulatory Visit: Payer: Self-pay

## 2022-01-05 DIAGNOSIS — M25572 Pain in left ankle and joints of left foot: Secondary | ICD-10-CM

## 2022-01-05 DIAGNOSIS — M7989 Other specified soft tissue disorders: Secondary | ICD-10-CM | POA: Diagnosis not present

## 2022-01-05 MED ORDER — PREDNISONE 20 MG PO TABS: 40.0000 mg | ORAL_TABLET | Freq: Every day | ORAL | 0 refills | Status: DC

## 2022-01-05 NOTE — ED Provider Notes (Signed)
?[facility] ? ? ? ?CSN: [id number] ?Arrival date & time: 01/05/22  1658 ? ? ?  ? ?History   ?Chief Complaint ?Chief Complaint  ?Patient presents with  ? Ankle Pain  ?  Left ankle pain  ? ? ?HPI ?[name] is a 41 y.o. female.  ? ?Presenting today with 2-week history of left ankle pain without known injury.  Denies swelling, discoloration, numbness, tingling, loss of range of motion.  States it hurts most when she is bearing weight on it.  Seem to start up after she had a bladder surgery on March 6.  Has been trying compression, over-the-counter pain relievers with no relief. ? ? ?Past Medical History:  ?Diagnosis Date  ? Anxiety   ? Depression   ? Enlarged heart   ? Fibroid 04/24/2016  ? GERD (gastroesophageal reflux disease)   ? Leaky heart valve   ? Menorrhagia with irregular cycle 04/13/2016  ? Multiple sclerosis ([place])   ? Dr Lonzo Cloud  ? Multiple sclerosis exacerbation ([place])   ? Numbness in right leg 02/13,12/12  ? Obesity (BMI 35.0-39.9 without comorbidity)   ? Other malaise and fatigue 10/20/2013  ? Ovarian cyst 04/24/2016  ? Regurgitation   ? cardiac valve. echo 2013 for first eval GILENYA,mod MR,TR  ? ? ?Patient Active Problem List  ? Diagnosis Date Noted  ? Irritability and anger 02/13/2019  ? Restless legs syndrome (RLS) 02/13/2019  ? Acute appendicitis   ? Enteritis 03/25/2018  ? Sciatica of right side associated with disorder of lumbosacral spine 05/09/2017  ? Status post lumbar spinal fusion 05/09/2017  ? Encounter for sterilization education 06/21/2016  ? Menorrhagia with regular cycle 06/21/2016  ? Fibroid 04/24/2016  ? Ovarian cyst 04/24/2016  ? Menorrhagia with irregular cycle 04/13/2016  ? Frontal sinusitis 09/30/2015  ? Photosensitivity 03/30/2015  ? Vertigo, aural 03/30/2015  ? Morbid obesity ([place]) 10/19/2014  ? Diplopia 10/19/2014  ? MS (multiple sclerosis) ([place]) 06/17/2014  ? Abnormality of gait 06/17/2014  ? Other malaise and fatigue 10/20/2013  ? Insomnia 08/06/2013  ? Mitral  insufficiency 05/17/2013  ? Tricuspid insufficiency 05/17/2013  ? Multiple sclerosis ([place]) 01/23/2013  ? Severe obesity (BMI >= 40) ([place]) 01/23/2013  ? 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 Depression with anxiety 01/23/2013  ? Anemia 02/22/2012  ? Hematochezia 02/22/2012  ? Globus sensation 02/22/2012  ? Dysphagia 02/22/2012  ? GERD 09/01/2010  ? CONSTIPATION, CHRONIC 09/01/2010  ? ? ?Past Surgical History:  ?Procedure Laterality Date  ? BACK SURGERY  2006  ? CESAREAN SECTION  2000  ? CHOLECYSTECTOMY  2010  ? COLONOSCOPY  09/14/2010  ? Fields-hyperplastic polyps, int hemorrhoids  ? CYST EXCISION  2006  ? pilonoidal  ? DILITATION & CURRETTAGE/HYSTROSCOPY WITH NOVASURE ABLATION N/A 07/21/2016  ? Procedure: DILATATION & CURETTAGE/HYSTEROSCOPY WITH NOVASURE ABLATION;  Surgeon: Jonnie Kind, MD;  Location: [facility];  Service: Gynecology;  Laterality: N/A;  Novasure ?lenght - 5.0 ?width - 4.1 ?power - 113 ?time - 2 min 03 seconds  ? ESOPHAGOGASTRODUODENOSCOPY  08/05/2008  ? Fields-incomplete Schatzki's ring, distal esophageal erosions/esophagitis(mild), small HH, NEGATIVE h pylori  ? LAPAROSCOPIC APPENDECTOMY N/A 03/26/2018  ? Procedure: APPENDECTOMY LAPAROSCOPIC;  Surgeon: Aviva Signs, MD;  Location: [facility];  Service: General;  Laterality: N/A;  ? LAPAROSCOPIC BILATERAL SALPINGECTOMY Bilateral 07/21/2016  ? Procedure: LAPAROSCOPIC RIGHT SALPINGECTOMY AND LEFT OOPHECTOMY;  Surgeon: Jonnie Kind, MD;  Location: [facility];  Service: Gynecology;  Laterality: Bilateral;  ? LUMBAR DISC SURGERY    ?  US ECHOCARDIOGRAPHY  03/19/2012  ? RV mildly dilated,RA mod. dilated,strial septum aneurysmal,Mod. MR & TR  ? ? ?OB History   ? ? Gravida  ?1  ? Para  ?1  ? Term  ?   ? Preterm  ?   ? AB  ?   ? Living  ?1  ?  ? ? SAB  ?   ? IAB  ?   ? Ectopic  ?   ? Multiple  ?   ? Live Births  ?   ?   ?  ?  ? ? ? ?Home Medications   ? ?Prior to Admission medications   ?Medication Sig Start Date End Date Taking? Authorizing Provider  ?predniSONE  (DELTASONE) 20 MG tablet Take 2 tablets (40 mg total) by mouth daily with breakfast. 01/05/22  Yes Volney American, PA-C  ?ALPRAZolam (XANAX) 1 MG tablet TAKE ONE-HALF TABLET BY MOUTH TWICE A DAY AS NEEDED FOR ANXIETY ?Patient not taking: Reported on 11/23/2021 08/15/21   Ward Givens, NP  ?chlorthalidone (HYGROTON) 25 MG tablet 25 mg daily.  12/11/17   [provider]  ?cyclobenzaprine (FLEXERIL) 10 MG tablet Take 10 mg by mouth 3 (three) times daily as needed for muscle spasms.    [provider]  ?EPINEPHrine 0.3 mg/0.3 mL IJ SOAJ injection Inject 0.3 mg into the muscle as needed for anaphylaxis. ?Patient not taking: Reported on 11/23/2021    [provider]  ?levocetirizine (XYZAL) 5 MG tablet Take 5 mg by mouth every evening.    [provider]  ?magnesium oxide (MAG-OX) 400 MG tablet Take 400 mg by mouth daily.    [provider]  ?methenamine (MANDELAMINE) 1 g tablet Take 1,000 mg by mouth 2 (two) times daily.    [provider]  ?nitrofurantoin, macrocrystal-monohydrate, (MACROBID) 100 MG capsule Take 100 mg by mouth 2 (two) times daily. 11/14/21   [provider]  ?pramipexole (MIRAPEX) 0.25 MG tablet TAKE ONE TABLET BY MOUTH AT [facility] AND ONE TABLET AT BEDTIME 11/23/20   Ward Givens, NP  ?rosuvastatin (CRESTOR) 10 MG tablet Take 1 tablet (10 mg total) by mouth daily. 11/04/21 02/02/22  Croitoru, Mihai, MD  ?sertraline (ZOLOFT) 100 MG tablet Take 100 mg by mouth daily. 09/01/21   [provider]  ?vitamin C (ASCORBIC ACID) 500 MG tablet Take 1 tablet by mouth daily. 02/04/18   [provider]  ?ZEPOSIA 0.92 MG CAPS TAKE 1 CAPSULE (0.'92MG'$ ) BY  MOUTH DAILY ?Patient taking differently: Take 1 capsule by mouth daily. 06/07/21   Ward Givens, NP  ? ? ?Family History ?Family History  ?Problem Relation Age of Onset  ? Other Mother   ?     lost leg, has trouble swallowing  ? Thyroid nodules Mother   ? Heart attack Father   ?  Coronary artery disease Father   ? Diabetes Father   ? Rheum arthritis Father   ? Colon cancer Paternal Uncle 27  ? Colon cancer Paternal Grandmother 74  ? Diabetes Paternal Grandmother   ? Cancer Paternal Grandmother   ?     breast,lung  ? Cancer Maternal Grandmother 5  ?     kind unknown  ? Diabetes Maternal Grandmother   ? Myasthenia gravis Maternal Grandfather   ? Other Sister   ?     tumor on ovary  ? ADD / ADHD Son   ? Heart attack Paternal Grandfather   ? Multiple sclerosis Other   ?  paternal great aunt  ? Breast cancer Neg Hx   ? ? ?Social History ?Social History  ? ?Tobacco Use  ? Smoking status: Former  ?  Packs/day: 1.00  ?  Years: 20.00  ?  Pack years: 20.00  ?  Types: Cigarettes  ?  Quit date: 02/15/2012  ?  Years since quitting: 9.8  ? Smokeless tobacco: Never  ?Vaping Use  ? Vaping Use: Never used  ?Substance Use Topics  ? Alcohol use: No  ?  Alcohol/week: 0.0 standard drinks  ? Drug use: No  ? ? ? ?Allergies   ?Diclofenac, Zofran [ondansetron hcl], and Propoxyphene n-acetaminophen ? ? ?Review of Systems ?Review of Systems ?Per HPI ? ?Physical Exam ?Triage Vital Signs ?ED Triage Vitals  ?Enc Vitals Group  ?   BP 01/05/22 1801 126/85  ?   Pulse Rate 01/05/22 1801 78  ?   Resp 01/05/22 1801 18  ?   Temp 01/05/22 1801 98.4 ?F (36.9 ?C)  ?   Temp Source 01/05/22 1801 Oral  ?   SpO2 01/05/22 1801 97 %  ?   Weight --   ?   Height --   ?   Head Circumference --   ?   Peak Flow --   ?   Pain Score 01/05/22 1802 8  ?   Pain Loc --   ?   Pain Edu? --   ?   Excl. in [place]? --   ? ?No data found. ? ?Updated Vital Signs ?BP 126/85 (BP Location: Right Arm)   Pulse 78   Temp 98.4 ?F (36.9 ?C) (Oral)   Resp 18   SpO2 97%  ? ?Visual Acuity ?Right Eye Distance:   ?Left Eye Distance:   ?Bilateral Distance:   ? ?Right Eye Near:   ?Left Eye Near:    ?Bilateral Near:    ? ?Physical Exam ?Vitals and nursing note reviewed.  ?Constitutional:   ?   Appearance: Normal appearance. She is not ill-appearing.  ?HENT:  ?    Head: Atraumatic.  ?   Mouth/Throat:  ?   Mouth: Mucous membranes are moist.  ?   Pharynx: Oropharynx is clear.  ?Eyes:  ?   Extraocular Movements: Extraocular movements intact.  ?   Conjunctiva/sclera: Conjunctivae

## 2022-01-05 NOTE — ED Triage Notes (Signed)
Pt states her left ankle has been hurting since March 6th after her bladder surgery ? ?Pt states she has no idea what happened it just started hurting after surgery ?

## 2022-01-06 DIAGNOSIS — G35 Multiple sclerosis: Secondary | ICD-10-CM | POA: Diagnosis not present

## 2022-01-06 DIAGNOSIS — Z7189 Other specified counseling: Secondary | ICD-10-CM | POA: Diagnosis not present

## 2022-01-06 DIAGNOSIS — Z87891 Personal history of nicotine dependence: Secondary | ICD-10-CM | POA: Diagnosis not present

## 2022-01-06 DIAGNOSIS — Z0000 Encounter for general adult medical examination without abnormal findings: Secondary | ICD-10-CM | POA: Diagnosis not present

## 2022-01-06 DIAGNOSIS — I10 Essential (primary) hypertension: Secondary | ICD-10-CM | POA: Diagnosis not present

## 2022-01-06 DIAGNOSIS — Z299 Encounter for prophylactic measures, unspecified: Secondary | ICD-10-CM | POA: Diagnosis not present

## 2022-01-09 DIAGNOSIS — N319 Neuromuscular dysfunction of bladder, unspecified: Secondary | ICD-10-CM | POA: Diagnosis not present

## 2022-01-09 DIAGNOSIS — R339 Retention of urine, unspecified: Secondary | ICD-10-CM | POA: Diagnosis not present

## 2022-01-09 DIAGNOSIS — R338 Other retention of urine: Secondary | ICD-10-CM | POA: Diagnosis not present

## 2022-01-09 DIAGNOSIS — N318 Other neuromuscular dysfunction of bladder: Secondary | ICD-10-CM | POA: Diagnosis not present

## 2022-01-09 DIAGNOSIS — G35 Multiple sclerosis: Secondary | ICD-10-CM | POA: Diagnosis not present

## 2022-03-28 ENCOUNTER — Other Ambulatory Visit: Payer: Self-pay | Admitting: Adult Health

## 2022-03-29 ENCOUNTER — Other Ambulatory Visit: Payer: Self-pay | Admitting: Adult Health

## 2022-03-29 NOTE — Telephone Encounter (Signed)
Last visit: 11/10/2021 Next visit: 05/11/2022 Per [place] registry, last filled on 02/07/2022 #30/15. Rx refill sent to work-in provider.

## 2022-04-01 ENCOUNTER — Other Ambulatory Visit: Payer: Self-pay | Admitting: Adult Health

## 2022-04-05 ENCOUNTER — Ambulatory Visit
Admission: RE | Admit: 2022-04-05 | Discharge: 2022-04-05 | Disposition: A | Discharge status: Home / Self Care | Payer: Medicare Other | Source: Ambulatory Visit | Attending: Internal Medicine | Admitting: Internal Medicine

## 2022-04-05 DIAGNOSIS — E7800 Pure hypercholesterolemia, unspecified: Secondary | ICD-10-CM | POA: Diagnosis not present

## 2022-04-05 DIAGNOSIS — Z0000 Encounter for general adult medical examination without abnormal findings: Secondary | ICD-10-CM | POA: Diagnosis not present

## 2022-04-05 DIAGNOSIS — R5383 Other fatigue: Secondary | ICD-10-CM | POA: Diagnosis not present

## 2022-04-05 DIAGNOSIS — Z79899 Other long term (current) drug therapy: Secondary | ICD-10-CM | POA: Diagnosis not present

## 2022-04-05 DIAGNOSIS — Z1231 Encounter for screening mammogram for malignant neoplasm of breast: Secondary | ICD-10-CM | POA: Diagnosis not present

## 2022-04-05 DIAGNOSIS — I10 Essential (primary) hypertension: Secondary | ICD-10-CM | POA: Diagnosis not present

## 2022-04-05 DIAGNOSIS — Z87891 Personal history of nicotine dependence: Secondary | ICD-10-CM | POA: Diagnosis not present

## 2022-04-05 DIAGNOSIS — Z299 Encounter for prophylactic measures, unspecified: Secondary | ICD-10-CM | POA: Diagnosis not present

## 2022-04-17 ENCOUNTER — Encounter: Payer: Self-pay | Admitting: *Deleted

## 2022-04-21 DIAGNOSIS — E7849 Other hyperlipidemia: Secondary | ICD-10-CM | POA: Diagnosis not present

## 2022-04-21 DIAGNOSIS — E1165 Type 2 diabetes mellitus with hyperglycemia: Secondary | ICD-10-CM | POA: Diagnosis not present

## 2022-04-21 DIAGNOSIS — I509 Heart failure, unspecified: Secondary | ICD-10-CM | POA: Diagnosis not present

## 2022-05-04 ENCOUNTER — Other Ambulatory Visit: Payer: Self-pay | Admitting: Adult Health

## 2022-05-04 NOTE — Telephone Encounter (Signed)
You saw pt 10-2021, did not mention in ov.  Ok to refill?

## 2022-05-10 ENCOUNTER — Encounter: Payer: Self-pay | Admitting: Obstetrics & Gynecology

## 2022-05-10 ENCOUNTER — Ambulatory Visit (INDEPENDENT_AMBULATORY_CARE_PROVIDER_SITE_OTHER): Payer: Medicare Other | Admitting: Obstetrics & Gynecology

## 2022-05-10 ENCOUNTER — Other Ambulatory Visit (HOSPITAL_COMMUNITY)
Admission: RE | Admit: 2022-05-10 | Discharge: 2022-05-10 | Disposition: A | Discharge status: Home / Self Care | Payer: Medicare Other | Source: Ambulatory Visit | Attending: Obstetrics & Gynecology | Admitting: Obstetrics & Gynecology

## 2022-05-10 VITALS — BP 102/72 | HR 76 | Ht 68.0 in | Wt 263.0 lb

## 2022-05-10 DIAGNOSIS — Z1151 Encounter for screening for human papillomavirus (HPV): Secondary | ICD-10-CM | POA: Diagnosis not present

## 2022-05-10 DIAGNOSIS — Z01419 Encounter for gynecological examination (general) (routine) without abnormal findings: Secondary | ICD-10-CM | POA: Diagnosis not present

## 2022-05-10 NOTE — Progress Notes (Signed)
   WELL-WOMAN EXAMINATION Patient name: [name] MRN [id number]  Date of birth: 29-Mar-1981 Chief Complaint:   Gynecologic Exam  History of Present Illness:   [name] is a 41 y.o. G1P1 female being seen today for a routine well-woman exam.  Today she notes no acute complaints from a gyn perspective. Her biggest concern is weight management.  She has tried to lose weight without success- has appt with provider to review options.  No LMP recorded. Patient has had an ablation. No periods since ablation.  Denies vaginal discharge, itching or discomfort. No pelvic or abdominal pain.  The current method of family planning is tubal ligation.    Last pap 2016.  Last mammogram: 03/2022. Last colonoscopy: desires hemoccult test due to family h/o colon cancer      05/10/2022    2:27 PM 10/19/2014    4:18 PM  Depression screen PHQ 2/9  Decreased Interest 0 0  Down, Depressed, Hopeless 0 1  PHQ - 2 Score 0 1  Altered sleeping 0 0  Tired, decreased energy 3 1  Change in appetite 0   Feeling bad or failure about yourself  0   Trouble concentrating 0 1  Moving slowly or fidgety/restless 0   Suicidal thoughts 0   PHQ-9 Score 3 3      Review of Systems:   Pertinent items are noted in HPI Denies any headaches, blurred vision, fatigue, shortness of breath, chest pain, abdominal pain, bowel movements, urination, or intercourse unless otherwise stated above.  Pertinent History Reviewed:  Reviewed past medical,surgical, social and family history.  Reviewed problem list, medications and allergies. Physical Assessment:   Vitals:   05/10/22 1414  BP: 102/72  Pulse: 76  Weight: 263 lb (119.3 kg)  Height: '5\' 8"'$  (1.727 m)  Body mass index is 39.99 kg/m.        Physical Examination:   General appearance - well appearing, and in no distress  Mental status - alert, oriented to person, place, and time  Psych:  She has a normal mood and affect  Skin - warm and dry, normal  color, no suspicious lesions noted  Chest - effort normal, all lung fields clear to auscultation bilaterally  Heart - normal rate and regular rhythm  Neck:  midline trachea, no thyromegaly or nodules  Breasts - breasts appear normal, no suspicious masses, no skin or nipple changes or  axillary nodes  Abdomen - obese, soft, nontender, nondistended, no masses or organomegaly  Pelvic - VULVA: normal appearing vulva with no masses, tenderness or lesions  VAGINA: normal appearing vagina with normal color and discharge, no lesions  CERVIX: normal appearing cervix without discharge or lesions, no CMT  Thin prep pap is done with HR HPV cotesting  UTERUS: uterus is felt to be normal size, shape, consistency and nontender   ADNEXA: No adnexal masses or tenderness noted.  Rectal - normal rectal, good sphincter tone, no masses felt. Hemoccult: negative  Extremities:  No swelling or varicosities noted  Chaperone: Celene Squibb     Assessment & Plan:  1) Well-Woman Exam -pap collected, reviewed screening guidelines -mammogram up to date   Follow-up: Return in about 1 year (around 05/11/2023) for Annual.   Janyth Pupa, DO Attending [place], [place] for [place], [place]

## 2022-05-11 ENCOUNTER — Ambulatory Visit (INDEPENDENT_AMBULATORY_CARE_PROVIDER_SITE_OTHER): Payer: Medicare Other | Admitting: Adult Health

## 2022-05-11 ENCOUNTER — Encounter: Payer: Self-pay | Admitting: Adult Health

## 2022-05-11 VITALS — BP 130/78 | HR 67 | Ht 68.0 in | Wt 261.0 lb

## 2022-05-11 DIAGNOSIS — G2581 Restless legs syndrome: Secondary | ICD-10-CM

## 2022-05-11 DIAGNOSIS — G35 Multiple sclerosis: Secondary | ICD-10-CM

## 2022-05-11 MED ORDER — PRAMIPEXOLE DIHYDROCHLORIDE 0.25 MG PO TABS: 0.2500 mg | ORAL_TABLET | Freq: Three times a day (TID) | ORAL | 3 refills | Status: DC

## 2022-05-11 NOTE — Patient Instructions (Addendum)
Your Plan:  Continue Zeposia Increase mirapex to 0.25 three times a day  Thank you for coming to see [place] at [facility]. I hope we have been able to provide you high quality care today.  You may receive a patient satisfaction survey over the next few weeks. We would appreciate your feedback and comments so that we may continue to improve ourselves and the health of our patients.

## 2022-05-11 NOTE — Progress Notes (Signed)
PATIENT: [name] DOB: December 06, 1980  REASON FOR VISIT: follow up HISTORY FROM: patient Primary neurologist: Dr. Brett Fairy  Chief Complaint  Patient presents with   Follow-up    Rm 19 alone Pt is well and stable, no new MS concerns. Does want to discuss weight concern      HISTORY OF PRESENT ILLNESS: Today 05/11/22:  [name] is a 41 year old female with a history of multiple sclerosis.  She returns today for follow-up.  She remains on Zeposia.   Reports that she is tolerating the medication well.  Denies any new numbness or weakness.  No changes in her gait or balance.  Reports that her legs ache but better after she takes mirapex. Denies any changes with her vision.  She continues to be followed by urology for chronic urinary tract infections and difficulty emptying bladder.  Now has a bladder stimulator- in march. Only had to self cath 6 times since them.   11/10/21: [name] is a 41 year old female with a history of multiple sclerosis.  She returns today for follow-up.  She continues on Zeposia.  Reports that this continues to work well for her.  She states that she continues to have ongoing back pain that is managed by neurosurgery.  She did try a nerve block but it was not helpful.  Recently started on Flexeril and has found that beneficial.  She continues to see urology for chronic urinary tract infections and difficulty emptying her bladder.  She has to self cath up to 3-4 times a day.  They are thinking about putting in a bladder stimulator.  She reports that it will be compatible for MRI.  She denies any new numbness or tingling.  No changes with her gait or balance.  No changes with her vision.  Recently had blood work in November and reports that she had blood work this morning with her urologist.  05/05/21: [name] is a 41 year old female with a history of multiple sclerosis.  She returns today for follow-up.  She has been started on Zeposia she states that since she  started this medication her back pain has tripled.  Reports that she is always had low back pain.  We had back surgery in 2006.  However she has noticed that since she started this medication her pain has tripled.  She denies any new numbness or tingling.  No changes with her gait or balance.  No changes with the bowels or bladder-continues to self cath.  She returns today for an evaluation.  12/22/20: [name] is a 41 year old female with a history of multiple sclerosis.  She returns today to discuss MRI results.  MRI of the brain did show progression  And an active demyelination.  The patient has been on Plegridy.  She has noticed some weakness in her right leg and right hand.   MRI brain with and without contrast December 10, 2020:  IMPRESSION: New subcentimeter focus of enhancement in the right frontal periventricular white matter most consistent with active demyelination. Otherwise stable moderate burden of chronic demyelination. Stable parenchymal volume loss.  MRI cervical spine with and without contrast December 10, 2020:  IMPRESSION: Likely no substantial change in chronic demyelinating cord lesions. No definite new lesion or abnormal enhancement.    11/23/20: [name] is a 41 year old female with a history of multiple sclerosis and restless leg syndrome.  She returns today for follow-up.  She remains on Plegridy.patient reports that since October she has noticed more weakness in the  right leg.  She states that she has been under a lot of stress as her father passed away in 08/31/23 and she has had to move.  No changes with the bowels or bladder-continues to self cath.  She plans to follow-up with her urologist to discuss any medication options.  Reports that she has had minimal changes in her gait mainly due to increased weakness in the right leg..  Denies any changes in her vision.  Remains on Mirapex for restless leg.  She returns today for an evaluation.    Imaging  09/22/2019:  MRI cervical spine with and without contrast: IMPRESSION: This MRI of the cervical spine with and without contrast shows the following: 1.   Multiple T2 hyperintense foci within the spinal cord as detailed above consistent with chronic demyelinating plaque associated with multiple sclerosis.  None of the foci enhanced.  Compared to the MRI dated 02/15/2017, there is no interval change. 2.   There is a normal enhancement pattern.  MRI brain with and without contrast:  IMPRESSION: This MRI of the brain with and without contrast shows the following: 1.   There are multiple T2/FLAIR hyperintense foci in the hemispheres, brainstem and left middle cerebellar peduncle in a pattern and configuration consistent with chronic demyelinating plaque associated with multiple sclerosis.  None of the foci appears to be acute and they do not enhance.  Compared to the MRI dated 02/15/2017, there is no interval change. 2.   There are no acute findings and there is a normal enhancement pattern.  HISTORY 02/19/20: [name] is a 41 year old female with a history of multiple sclerosis and restless leg syndrome.  She returns today for follow-up.  She remains on Plegridy.  She did have a potential exacerbation in March.  She received IV Solu-Medrol with good benefit.  Reports that her symptoms have been stable since then.  She denies any new numbness or weakness.  Denies any changes with her gait or balance.  She continues to have the self cath.  Remains on Mirapex for restless leg.  She returns today for an evaluation.  HISTORY  08/20/19:   [name] is a 41 year old female with a history of multiple sclerosis and restless leg syndrome.  She returns today for follow-up.  She continues on Plegridy.  She continues to tolerate this medication well.  She denies any new numbness or weakness.  Denies any changes with her gait or balance.  She is now having to self cath up to 3 times a day.  She has not seen Dr.  Zigmund Daniel in 2 years.  She states that she keeps a urinary tract infection despite being on preventative medication.  No change in her vision.  Her last MRI of the brain and cervical spine was in 2018.  She reports that Mirapex has been beneficial for restless leg syndrome.  She continues to take 0.25 mg at dinnertime and at bedtime.  She returns today for evaluation.  REVIEW OF SYSTEMS: Out of a complete 14 system review of symptoms, the patient complains only of the following symptoms, and all other reviewed systems are negative.  See HPI  ALLERGIES: Allergies  Allergen Reactions   Diclofenac Anaphylaxis   Zofran [Ondansetron Hcl] Other (See Comments)    Prolonged QT - unable to have zofran per report   Propoxyphene N-Acetaminophen Rash    HOME MEDICATIONS: Outpatient Medications Prior to Visit  Medication Sig Dispense Refill   ALPRAZolam (XANAX) 1 MG tablet TAKE ONE-HALF TABLET BY MOUTH  TWICE A DAY AS NEEDED FOR ANXIETY 30 tablet 0   chlorthalidone (HYGROTON) 25 MG tablet 25 mg daily.      cyclobenzaprine (FLEXERIL) 10 MG tablet Take 10 mg by mouth 3 (three) times daily as needed for muscle spasms.     levocetirizine (XYZAL) 5 MG tablet Take 5 mg by mouth every evening.     pramipexole (MIRAPEX) 0.25 MG tablet TAKE ONE TABLET BY MOUTH AT DINNER AND ONE TABLET AT BEDTIME 180 tablet 3   predniSONE (DELTASONE) 20 MG tablet Take 2 tablets (40 mg total) by mouth daily with breakfast. 8 tablet 0   rosuvastatin (CRESTOR) 10 MG tablet Take 1 tablet (10 mg total) by mouth daily. 90 tablet 3   sertraline (ZOLOFT) 100 MG tablet Take 100 mg by mouth daily.     ZEPOSIA 0.92 MG CAPS TAKE 1 CAPSULE (0.'92MG'$ ) BY  MOUTH DAILY 90 capsule 0   EPINEPHrine 0.3 mg/0.3 mL IJ SOAJ injection Inject 0.3 mg into the muscle as needed for anaphylaxis. (Patient not taking: Reported on 11/23/2021)     magnesium oxide (MAG-OX) 400 MG tablet Take 400 mg by mouth daily. (Patient not taking: Reported on 05/10/2022)      methenamine (MANDELAMINE) 1 g tablet Take 1,000 mg by mouth 2 (two) times daily. (Patient not taking: Reported on 05/10/2022)     nitrofurantoin, macrocrystal-monohydrate, (MACROBID) 100 MG capsule Take 100 mg by mouth 2 (two) times daily. (Patient not taking: Reported on 05/10/2022)     vitamin C (ASCORBIC ACID) 500 MG tablet Take 1 tablet by mouth daily. (Patient not taking: Reported on 05/10/2022)     No facility-administered medications prior to visit.    PAST MEDICAL HISTORY: Past Medical History:  Diagnosis Date   Anxiety    Depression    Enlarged heart    Fibroid 04/24/2016   GERD (gastroesophageal reflux disease)    Leaky heart valve    Menorrhagia with irregular cycle 04/13/2016   Multiple sclerosis ([place])    Dr Lonzo Cloud   Multiple sclerosis exacerbation ([place])    Numbness in right leg 02/13,12/12   Obesity (BMI 35.0-39.9 without comorbidity)    Other malaise and fatigue 10/20/2013   Ovarian cyst 04/24/2016   Regurgitation    cardiac valve. echo 2013 for first eval GILENYA,mod MR,TR    PAST SURGICAL HISTORY: Past Surgical History:  Procedure Laterality Date   BACK SURGERY  2006   CESAREAN SECTION  2000   CHOLECYSTECTOMY  2010   COLONOSCOPY  09/14/2010   Fields-hyperplastic polyps, int hemorrhoids   CYST EXCISION  2006   pilonoidal   DILITATION & CURRETTAGE/HYSTROSCOPY WITH NOVASURE ABLATION N/A 07/21/2016   Procedure: DILATATION & CURETTAGE/HYSTEROSCOPY WITH NOVASURE ABLATION;  Surgeon: Jonnie Kind, MD;  Location: [facility];  Service: Gynecology;  Laterality: N/A;  Novasure lenght - 5.0 width - 4.1 power - 113 time - 2 min 03 seconds   ESOPHAGOGASTRODUODENOSCOPY  08/05/2008   Fields-incomplete Schatzki's ring, distal esophageal erosions/esophagitis(mild), small HH, NEGATIVE h pylori   LAPAROSCOPIC APPENDECTOMY N/A 03/26/2018   Procedure: APPENDECTOMY LAPAROSCOPIC;  Surgeon: Aviva Signs, MD;  Location: [facility];  Service: General;  Laterality: N/A;   LAPAROSCOPIC  BILATERAL SALPINGECTOMY Bilateral 07/21/2016   Procedure: LAPAROSCOPIC RIGHT SALPINGECTOMY AND LEFT OOPHECTOMY;  Surgeon: Jonnie Kind, MD;  Location: [facility];  Service: Gynecology;  Laterality: Bilateral;   LUMBAR DISC SURGERY     US ECHOCARDIOGRAPHY  03/19/2012   RV mildly dilated,RA mod. dilated,strial septum aneurysmal,Mod. MR & TR  FAMILY HISTORY: Family History  Problem Relation Age of Onset   Other Mother        lost leg, has trouble swallowing   Thyroid nodules Mother    Heart attack Father    Coronary artery disease Father    Diabetes Father    Rheum arthritis Father    Colon cancer Paternal Uncle 48   Colon cancer Paternal Grandmother 20   Diabetes Paternal Grandmother    Cancer Paternal Grandmother        breast,lung   Cancer Maternal Grandmother 45       kind unknown   Diabetes Maternal Grandmother    Myasthenia gravis Maternal Grandfather    Other Sister        tumor on ovary   ADD / ADHD Son    Heart attack Paternal Grandfather    Multiple sclerosis Other        paternal great aunt   Breast cancer Neg Hx     SOCIAL HISTORY: Social History   Socioeconomic History   Marital status: Divorced    Spouse name: Divorced    Number of children: 1   Years of education: college   Highest education level: Not on file  Occupational History   Occupation: disabled  Tobacco Use   Smoking status: Former    Packs/day: 1.00    Years: 20.00    Total pack years: 20.00    Types: Cigarettes    Quit date: 02/15/2012    Years since quitting: 10.2   Smokeless tobacco: Never  Vaping Use   Vaping Use: Never used  Substance and Sexual Activity   Alcohol use: No    Alcohol/week: 0.0 standard drinks of alcohol   Drug use: No   Sexual activity: Never    Birth control/protection: None  Other Topics Concern   Not on file  Social History Narrative   Patient is divorced and lives at home and he son lives with her.   Patient has one child.   Patient is disabled.    Patient has some college education.   Patient is right-handed.   Patient drinks 3 servings of caffeine daily      Social Determinants of Health   Financial Resource Strain: Low Risk  (05/10/2022)   Overall Financial Resource Strain (CARDIA)    Difficulty of Paying Living Expenses: Not hard at all  Food Insecurity: No Food Insecurity (05/10/2022)   Hunger Vital Sign    Worried About Running Out of Food in the Last Year: Never true    Ran Out of Food in the Last Year: Never true  Transportation Needs: No Transportation Needs (05/10/2022)   PRAPARE - Hydrologist (Medical): No    Lack of Transportation (Non-Medical): No  Physical Activity: Inactive (05/10/2022)   Exercise Vital Sign    Days of Exercise per Week: 0 days    Minutes of Exercise per Session: 0 min  Stress: No Stress Concern Present (05/10/2022)   [place]    Feeling of Stress : Not at all  Social Connections: Moderately Integrated (05/10/2022)   Social Connection and Isolation Panel [NHANES]    Frequency of Communication with Friends and Family: More than three times a week    Frequency of Social Gatherings with Friends and Family: More than three times a week    Attends Religious Services: More than 4 times per year    Active Member of [employer] or Organizations:  Yes    Attends Club or Organization Meetings: More than 4 times per year    Marital Status: Divorced  Intimate Partner Violence: Not At Risk (05/10/2022)   Humiliation, Afraid, Rape, and Kick questionnaire    Fear of Current or Ex-Partner: No    Emotionally Abused: No    Physically Abused: No    Sexually Abused: No      PHYSICAL EXAM  Vitals:   05/11/22 1321  Weight: 261 lb (118.4 kg)  Height: '5\' 8"'$  (1.727 m)    Body mass index is 39.68 kg/m.  Generalized: Well developed, in no acute distress   Neurological examination  Mentation: Alert oriented to time,  place, history taking. Follows all commands speech and language fluent Cranial nerve II-XII: . Extraocular movements were full, visual field were full on confrontational test. . Head turning and shoulder shrug  were normal and symmetric. Motor: The motor testing reveals 5 over 5 strength of all 4 extremities with the exception of slightly weaker in the right lower extremity.  Good symmetric motor tone is noted throughout.  Sensory: Sensory testing is intact to soft touch on all 4 extremities. No evidence of extinction is noted.  Coordination: Cerebellar testing reveals good finger-nose-finger and heel-to-shin bilaterally.  Gait and station: Patient has a slight limp when ambulating. Reflexes: Deep tendon reflexes are symmetric and normal bilaterally.   DIAGNOSTIC DATA (LABS, IMAGING, TESTING) - I reviewed patient records, labs, notes, testing and imaging myself where available.  Lab Results  Component Value Date   WBC 9.0 08/27/2021   HGB 14.2 08/27/2021   HCT 42.4 08/27/2021   MCV 88.3 08/27/2021   PLT 299 08/27/2021      Component Value Date/Time   NA 137 08/27/2021 1508   NA 140 05/05/2021 1405   K 4.0 08/27/2021 1508   K 4.2 02/28/2012 1113   CL 103 08/27/2021 1508   CL 107 02/28/2012 1113   CO2 28 08/27/2021 1508   CO2 23 02/28/2012 1113   GLUCOSE 92 08/27/2021 1508   BUN 14 08/27/2021 1508   BUN 12 05/05/2021 1405   CREATININE 0.58 08/27/2021 1508   CREATININE 0.73 02/28/2012 1113   CALCIUM 8.9 08/27/2021 1508   CALCIUM 9.5 02/28/2012 1113   PROT 6.6 08/27/2021 1508   PROT 6.5 05/05/2021 1405   PROT 6.4 02/28/2012 1113   ALBUMIN 3.7 08/27/2021 1508   ALBUMIN 4.3 05/05/2021 1405   AST 17 08/27/2021 1508   AST 14 02/28/2012 1113   ALT 23 08/27/2021 1508   ALKPHOS 43 08/27/2021 1508   ALKPHOS 49 02/28/2012 1113   BILITOT 0.6 08/27/2021 1508   BILITOT 0.2 05/05/2021 1405   GFRNONAA >60 08/27/2021 1508   GFRAA 122 11/23/2020 1512   Lab Results  Component Value  Date   CHOL 166 11/03/2021   HDL 51 11/03/2021   LDLCALC 95 11/03/2021   TRIG 108 11/03/2021   CHOLHDL 3.3 11/03/2021    Lab Results  Component Value Date   TSH 3.700 12/09/2014      ASSESSMENT AND PLAN 41 y.o. year old female  has a past medical history of Anxiety, Depression, Enlarged heart, Fibroid (04/24/2016), GERD (gastroesophageal reflux disease), Leaky heart valve, Menorrhagia with irregular cycle (04/13/2016), Multiple sclerosis ([place]), Multiple sclerosis exacerbation ([place]), Numbness in right leg (02/13,12/12), Obesity (BMI 35.0-39.9 without comorbidity), Other malaise and fatigue (10/20/2013), Ovarian cyst (04/24/2016), and Regurgitation. here with:  1.  Multiple sclerosis   - Continue Zeposia - Had blood work with her PCP-  will fax report -Patient asking about weight loss-we discussed Ozempic according to clinical key there is no interaction between Zeposia and Ozempic  2.  RLS  - Increase mirapex to 0.25 TID   -Follow-up in 6 months months or sooner if needed  Advised if symptoms worsen or she develops new symptoms she should let us know follow-up in 6 months or sooner if needed   Ward Givens, MSN, NP-C 05/11/2022, 1:23 PM [facility] [address] [place], [place] [address] [phone]

## 2022-05-15 LAB — CYTOLOGY - PAP
Comment: NEGATIVE
High risk HPV: NEGATIVE

## 2022-06-07 ENCOUNTER — Encounter: Payer: Self-pay | Admitting: Adult Health

## 2022-06-08 NOTE — Telephone Encounter (Signed)
Was this meant for our office?

## 2022-06-14 ENCOUNTER — Other Ambulatory Visit: Payer: Self-pay | Admitting: Adult Health

## 2022-07-04 DIAGNOSIS — M5136 Other intervertebral disc degeneration, lumbar region: Secondary | ICD-10-CM | POA: Diagnosis not present

## 2022-08-14 ENCOUNTER — Other Ambulatory Visit: Payer: Self-pay | Admitting: Neurology

## 2022-09-26 ENCOUNTER — Encounter: Payer: Self-pay | Admitting: *Deleted

## 2022-10-05 DIAGNOSIS — I10 Essential (primary) hypertension: Secondary | ICD-10-CM | POA: Diagnosis not present

## 2022-10-05 DIAGNOSIS — Z713 Dietary counseling and surveillance: Secondary | ICD-10-CM | POA: Diagnosis not present

## 2022-10-05 DIAGNOSIS — Z299 Encounter for prophylactic measures, unspecified: Secondary | ICD-10-CM | POA: Diagnosis not present

## 2022-11-08 ENCOUNTER — Ambulatory Visit: Payer: Medicare Other | Admitting: Neurology

## 2022-11-09 ENCOUNTER — Other Ambulatory Visit: Payer: Self-pay | Admitting: Adult Health

## 2022-11-22 ENCOUNTER — Ambulatory Visit: Payer: Medicare Other | Admitting: Neurology

## 2022-11-29 ENCOUNTER — Telehealth: Payer: Self-pay | Admitting: Neurology

## 2022-11-29 ENCOUNTER — Ambulatory Visit (INDEPENDENT_AMBULATORY_CARE_PROVIDER_SITE_OTHER): Payer: 59 | Admitting: Neurology

## 2022-11-29 ENCOUNTER — Encounter: Payer: Self-pay | Admitting: Neurology

## 2022-11-29 VITALS — BP 142/81 | HR 69 | Ht 68.0 in | Wt 269.2 lb

## 2022-11-29 DIAGNOSIS — R208 Other disturbances of skin sensation: Secondary | ICD-10-CM | POA: Insufficient documentation

## 2022-11-29 DIAGNOSIS — G35 Multiple sclerosis: Secondary | ICD-10-CM | POA: Diagnosis not present

## 2022-11-29 DIAGNOSIS — G2581 Restless legs syndrome: Secondary | ICD-10-CM | POA: Diagnosis not present

## 2022-11-29 DIAGNOSIS — N319 Neuromuscular dysfunction of bladder, unspecified: Secondary | ICD-10-CM | POA: Diagnosis not present

## 2022-11-29 NOTE — Patient Instructions (Signed)
Multiple Sclerosis Multiple sclerosis (MS) is a disease of the brain, spinal cord, and optic nerves (central nervous system). It causes the body's disease-fighting system (immunesystem) to destroy the protective covering around nerves in the brain (myelin sheath). When this happens, signals (nerve impulses) going to and from the brain and spinal cord do not get sent properly or may not get sent at all. There are several types of MS: Relapsing-remitting MS. This is the most common type. This causes sudden attacks of symptoms. After an attack, you may recover completely until the next attack, or some symptoms may remain permanently. Secondary progressive MS. This usually develops after the onset of relapsing-remitting MS. Similar to relapsing-remitting MS, this type also causes sudden attacks of symptoms. Attacks may be less frequent, but symptoms slowly get worse over time. Primary progressive MS. This causes symptoms that steadily progress over time. This type of MS does not cause sudden attacks of symptoms. The age of onset of MS varies, but it often develops between 20 and 40 years of age. MS is a lifelong (chronic) condition. There is no cure, but treatment can help slow down the progression of the disease. What are the causes? The cause of this condition is not known. What increases the risk? You are more likely to develop this condition if: You are a woman. You have a relative with MS. However, the condition is not passed from parent to child (inherited). You have a lack (deficiency) of vitamin D. You smoke. MS is more common in the [place] than in the [place]. What are the signs or symptoms? Relapsing-remitting and secondary progressive MS cause symptoms to occur in episodes or attacks that may last weeks to months. There may be long periods between attacks in which there are almost no symptoms. Primary progressive MS causes symptoms to steadily progress after  they develop. Symptoms of MS vary because of the many different ways it affects the central nervous system. The main symptoms include: Vision problems and eye pain. Numbness and weakness. Inability to move your arms, hands, feet, or legs (paralysis). Balance problems. Shaking that you cannot control (tremors). Sudden muscle tightening (spasms). Problems with thinking (cognitive changes). MS can also cause symptoms that are associated with the disease but are not always the direct result of an MS attack. They may include: Inability to control when you urinate or have bowel movements (incontinence). Headaches. Fatigue. Inability to tolerate heat. Emotional changes. Depression. Pain. How is this diagnosed? This condition is diagnosed based on: Your symptoms. A neurological exam. This involves checking your central nervous system function, such as nerve function, reflexes, and coordination. MRIs of the brain and spinal cord. Lab tests, including a lumbar puncture that tests the fluid that surrounds the brain and spinal cord (cerebrospinal fluid). Tests to measure the electrical activity of the brain in response to stimulation (evoked potentials). How is this treated? There is no cure for MS, but medicines can help decrease the number and frequency of attacks and help relieve nuisance symptoms. Treatment options may include: Medicines that: Reduce the frequency of attacks. These medicines may be given by injection, by mouth (orally), or through an IV. Reduce inflammation (steroids). These may provide short-term relief of symptoms. Help control pain, depression, fatigue, or incontinence. Nutritional counseling. Eating a healthy, balanced diet can help with symptoms. Taking vitamin D supplements, if you have a deficiency. Using devices to help you move around (assistive devices), such as braces, a cane, or a walker. Therapy, such   as: Physical therapy to strengthen and stretch your  muscles. Occupational therapy to help you with everyday tasks. Alternative or complementary treatments such as massage or acupuncture. Low-impact, mild exercises, such as swimming, walking, and yoga. Regular exercise can help alleviate symptoms and increase strength and balance. Follow these instructions at home: Medicines Take over-the-counter and prescription medicines only as told by your health care provider. Ask your health care provider if the medicine prescribed to you requires you to avoid driving or using machinery. Activity Use assistive devices as recommended by your physical therapist or your health care provider. Exercise as directed by your health care provider. Return to your normal activities as told by your health care provider. Ask your health care provider what activities are safe for you. General instructions Eating healthy can help manage MS symptoms. Reach out for support. Share your feelings with friends, family, or a support group. Keep all follow-up visits. This is important. Where to find more information National Multiple Sclerosis Society: www.nationalmssociety.org National Institute of Neurological Disorders and Stroke: www.ninds.nih.gov National [facility] for Complementary and Integrative Health: www.nccih.nih.gov Contact a health care provider if: You feel depressed. You develop new pain or numbness. You have tremors. You have problems with sexual function. Get help right away if: You develop paralysis. You develop numbness. You have problems with your bladder or bowel function. You develop double vision. You lose vision in one or both eyes. You develop suicidal thoughts. You develop severe confusion. Get help right away if you feel like you may hurt yourself or others, or have thoughts about taking your own life. Go to your nearest emergency room or: Call 911. Call the National Suicide Prevention Lifeline at 1-800-273-8255 or 988. This is open 24 hours  a day. Text the Crisis Text Line at 741741. Summary Multiple sclerosis (MS) is a disease of the central nervous system that causes the body's immune system to destroy the protective covering around nerves in the brain (myelin sheath). There are 3 types of MS: relapsing-remitting, secondary progressive, and primary progressive. There is no cure for MS, but medicines can help decrease the number and frequency of attacks and help relieve nuisance symptoms. Treatment may also include physical or occupational therapy. If you develop numbness, paralysis, vision problems, or other neurological symptoms, get help right away. This information is not intended to replace advice given to you by your health care provider. Make sure you discuss any questions you have with your health care provider. Document Revised: 06/15/2021 Document Reviewed: 06/15/2021 [employer] Patient Education  2023 [employer]  

## 2022-11-29 NOTE — Progress Notes (Signed)
PATIENT: [name] DOB: June 09, 1981  REASON FOR VISIT: follow up HISTORY FROM: patient Primary neurologist: Dr. Brett Fairy  Chief Complaint  Patient presents with   Follow-up    Patient in room #2 and alone. Pt here today to f/u with her MS. Patient states her left hand and leg has been feeling numb and tingling.     HISTORY OF PRESENT ILLNESS: Today 11/29/22: CD 11-29-2022- Norman Herrlich, a 42  year-old patient with secondary progressive MS with some relapses presents today for a RV. Henrietta Dine is her medication of choice, she started that after having fresh lesions on MRI while on PLEGREDY in 2021. Marland Kitchen   Her son is now 52 and got married 2 years ago.  She now has a bladder stimulator that almost eliminated the need for self catheterization.  Leg aches and back aches have been better on Flexaril. Remains on Mirapex for restless leg.   She reports new onset tingling on the left sie of the body, and usually she had this dyseasthesias on the right.  She reports nocturnal burning sensation in the e right leg and foot.  She used to get Solumdrol IV for the exacerbation. Had a MRI last year at this time, no new lesions. Cervical spine MRI was also not showing new lesions, but clinically there have been changes since last year.    [name] is a 42 year old female with a history of multiple sclerosis.  She remains on Zeposia.   Reports that she is tolerating the medication well.  Denies any new numbness or weakness.  No changes in her gait or balance.  Reports that her legs ache but better after she takes mirapex. Denies any changes with her vision.  She continues to be followed by urology for chronic urinary tract infections and difficulty emptying bladder.  Now has a bladder stimulator- in march. Only had to self cath 6 times since them.   11/10/21: [name] is a 42 year old female with a history of multiple sclerosis.  She returns today for follow-up.  She continues on Zeposia.  Reports  that this continues to work well for her.  She states that she continues to have ongoing back pain that is managed by neurosurgery.  She did try a nerve block but it was not helpful.  Recently started on Flexeril and has found that beneficial.  She continues to see urology for chronic urinary tract infections and difficulty emptying her bladder.  She has to self cath up to 3-4 times a day.  They are thinking about putting in a bladder stimulator.  She reports that it will be compatible for MRI.  She denies any new numbness or tingling.  No changes with her gait or balance.  No changes with her vision.  Recently had blood work in November and reports that she had blood work this morning with her urologist.  05/05/21: [name] is a 42 year old female with a history of multiple sclerosis.  She returns today for follow-up.  She has been started on Zeposia she states that since she started this medication her back pain has tripled.  Reports that she is always had low back pain.  We had back surgery in 2006.  However she has noticed that since she started this medication her pain has tripled.  She denies any new numbness or tingling.  No changes with her gait or balance.  No changes with the bowels or bladder-continues to self cath.  She returns today for  an evaluation.  12/22/20: [name] is a 42 year old female with a history of multiple sclerosis.  She returns today to discuss MRI results.  MRI of the brain did show progression  And an active demyelination.  The patient has been on Plegridy.  She has noticed some weakness in her right leg and right hand.  MS patient  02/13/19: more self cath needed, more RLS , feeling in pain, cath every day 6 times or more.   [name] is a 42 year old female with a history of multiple sclerosis.  She returns today for follow-up.  She is currently taking Plegridy and tolerating it well.  She denies any new numbness or weakness.  She continues to have numbness in the hands.   Denies any changes with her gait or balance.  No change with her vision.  She continues to see Dr. Zigmund Daniel with urology.  On occasion she does have to self catheterize- 1 time a month.  Her last MRI of the brain and cervical spine was done in 2018 and did not show any significant changes.  She returns today for evaluation.  HISTORY 02/05/18  [name] is a 42 year old female with a history of multiple sclerosis.  She returns today for follow-up.  She is currently on ampyra and plegridy.  She continues to tolerate these medications well.  She does not feel that the Ampyra patient has offered her much benefit.  She denies any new numbness or tingling. Has always had numbness in the hands.  Denies any weakness in extremities.  No change in bowel or bladder.  She continues to have to self catheterize on occasion.  She states that this does not occur daily.  She does see Dr. Zigmund Daniel with urology.  No change in her gait or balance.  Denies any changes with her vision.  No significant changes with her mood or behavior.  She returns today for evaluation.    11/23/20: [name] is a 42 year old female with a history of multiple sclerosis and restless leg syndrome.  She returns today for follow-up.  She remains on Plegridy.patient reports that since 08/18/23 she has noticed more weakness in the right leg.  She states that she has been under a lot of stress as her father passed away in 2023-08-18 and she has had to move.  No changes with the bowels or bladder-continues to self cath.  She plans to follow-up with her urologist to discuss any medication options.  Reports that she has had minimal changes in her gait mainly due to increased weakness in the right leg..  Denies any changes in her vision.  Remains on Mirapex for restless leg.  She returns today for an evaluation.     MRI 2023:  _   IMPRESSION: This MRI of the brain with and without contrast shows the following: 1.   Multiple T2/FLAIR hyperintense foci in the  cerebral hemispheres, left cerebellar hemisphere and brainstem in a pattern and configuration consistent with chronic demyelinating plaque associated with multiple sclerosis.  None of the foci enhanced or appear to be acute.  Compared to the MRI dated 12/10/2020, there were no new lesions. 2.   Minimal chronic sphenoid sinusitis.   3.   No acute findings.  Normal enhancement pattern.    IMPRESSION: This MRI of the cervical spine with and without contrast shows the following: 1.   Multiple T2 hyperintense foci within the spinal cord as detailed above.  None of these enhance with contrast.  Compared to the  MRI from 12/10/2020 and from 09/22/2019, there do not appear to be any new lesions.  Foci also noted in the brainstem.  The foci are consistent with chronic demyelinating plaques associated with multiple sclerosis. 2.   Degenerative changes at C3-C4 causing mild right foraminal narrowing but no spinal stenosis or nerve root compression. 3.   Normal enhancement pattern.   INTERPRETING PHYSICIAN:  Richard A. Felecia Shelling, MD, PhD, FAAN   HISTORY 02/19/20: MM; Ms. Dimperio is a 42 year old female with a history of multiple sclerosis and restless leg syndrome.  She returns today for follow-up.  She remains on Plegridy.  She did have a potential exacerbation in March.  She received IV Solu-Medrol with good benefit.  Reports that her symptoms have been stable since then.  She denies any new numbness or weakness.  Denies any changes with her gait or balance.  She continues to have the self cath.  Remains on Mirapex for restless leg.  She returns today for an evaluation.  HISTORY  08/20/19:   MM- Ms. Phenix is a 42 year old female with a history of multiple sclerosis and restless leg syndrome.  She returns today for follow-up.  She continues on Plegridy.  She continues to tolerate this medication well.  She denies any new numbness or weakness.  Denies any changes with her gait or balance.  She is now having to self  cath up to 3 times a day.  She has not seen Dr. Zigmund Daniel in 2 years.  She states that she keeps a urinary tract infection despite being on preventative medication.  No change in her vision.  Her last MRI of the brain and cervical spine was in 2018.  She reports that Mirapex has been beneficial for restless leg syndrome.  She continues to take 0.25 mg at dinnertime and at bedtime.  She returns today for evaluation.  REVIEW OF SYSTEMS: Out of a complete 14 system review of symptoms, the patient complains only of the following symptoms, and all other reviewed systems are negative.  How likely are you to doze in the following situations: 0 = not likely, 1 = slight chance, 2 = moderate chance, 3 = high chance  Sitting and Reading? Watching Television? Sitting inactive in a public place (theater or meeting)? Lying down in the afternoon when circumstances permit? Sitting and talking to someone? Sitting quietly after lunch without alcohol? In a car, while stopped for a few minutes in traffic? As a passenger in a car for an hour without a break?  Total = 3/ 24 FSS at 35/ 63 points.   Denies snoring or apnea , and is not interested in sleep test at this time.     RLS   Epworth sleepiness scor:   ALLERGIES: Allergies  Allergen Reactions   Diclofenac Anaphylaxis   Zofran [Ondansetron Hcl] Other (See Comments)    Prolonged QT - unable to have zofran per report   Propoxyphene N-Acetaminophen Rash    HOME MEDICATIONS: Outpatient Medications Prior to Visit  Medication Sig Dispense Refill   ALPRAZolam (XANAX) 1 MG tablet TAKE ONE-HALF TABLET BY MOUTH TWICE A DAY AS NEEDED FOR ANXIETY 30 tablet 5   chlorthalidone (HYGROTON) 25 MG tablet 25 mg daily.      cyclobenzaprine (FLEXERIL) 10 MG tablet Take 10 mg by mouth 3 (three) times daily as needed for muscle spasms.     levocetirizine (XYZAL) 5 MG tablet Take 5 mg by mouth every evening.     pramipexole (MIRAPEX) 0.25 MG tablet Take  1 tablet  (0.25 mg total) by mouth 3 (three) times daily. 270 tablet 3   predniSONE (DELTASONE) 20 MG tablet Take 2 tablets (40 mg total) by mouth daily with breakfast. 8 tablet 0   sertraline (ZOLOFT) 100 MG tablet Take 100 mg by mouth daily.     ZEPOSIA 0.92 MG CAPS TAKE 1 CAPSULE (0.'92MG'$ ) BY MOUTH DAILY 90 capsule 1   rosuvastatin (CRESTOR) 10 MG tablet Take 1 tablet (10 mg total) by mouth daily. 90 tablet 3   No facility-administered medications prior to visit.    PAST MEDICAL HISTORY: Past Medical History:  Diagnosis Date   Anxiety    Depression    Enlarged heart    Fibroid 04/24/2016   GERD (gastroesophageal reflux disease)    Leaky heart valve    Menorrhagia with irregular cycle 04/13/2016   Multiple sclerosis ([place])    Dr Lonzo Cloud   Multiple sclerosis exacerbation ([place])    Numbness in right leg 02/13,12/12   Obesity (BMI 35.0-39.9 without comorbidity)    Other malaise and fatigue 10/20/2013   Ovarian cyst 04/24/2016   Regurgitation    cardiac valve. echo 2013 for first eval GILENYA,mod MR,TR    PAST SURGICAL HISTORY: Past Surgical History:  Procedure Laterality Date   BACK SURGERY  2006   CESAREAN SECTION  2000   CHOLECYSTECTOMY  2010   COLONOSCOPY  09/14/2010   Fields-hyperplastic polyps, int hemorrhoids   CYST EXCISION  2006   pilonoidal   DILITATION & CURRETTAGE/HYSTROSCOPY WITH NOVASURE ABLATION N/A 07/21/2016   Procedure: DILATATION & CURETTAGE/HYSTEROSCOPY WITH NOVASURE ABLATION;  Surgeon: Jonnie Kind, MD;  Location: [facility];  Service: Gynecology;  Laterality: N/A;  Novasure lenght - 5.0 width - 4.1 power - 113 time - 2 min 03 seconds   ESOPHAGOGASTRODUODENOSCOPY  08/05/2008   Fields-incomplete Schatzki's ring, distal esophageal erosions/esophagitis(mild), small HH, NEGATIVE h pylori   LAPAROSCOPIC APPENDECTOMY N/A 03/26/2018   Procedure: APPENDECTOMY LAPAROSCOPIC;  Surgeon: Aviva Signs, MD;  Location: [facility];  Service: General;  Laterality: N/A;   LAPAROSCOPIC  BILATERAL SALPINGECTOMY Bilateral 07/21/2016   Procedure: LAPAROSCOPIC RIGHT SALPINGECTOMY AND LEFT OOPHECTOMY;  Surgeon: Jonnie Kind, MD;  Location: [facility];  Service: Gynecology;  Laterality: Bilateral;   LUMBAR DISC SURGERY     US ECHOCARDIOGRAPHY  03/19/2012   RV mildly dilated,RA mod. dilated,strial septum aneurysmal,Mod. MR & TR    FAMILY HISTORY: Family History  Problem Relation Age of Onset   Other Mother        lost leg, has trouble swallowing   Thyroid nodules Mother    Heart attack Father    Coronary artery disease Father    Diabetes Father    Rheum arthritis Father    Colon cancer Paternal Uncle 83   Colon cancer Paternal Grandmother 90   Diabetes Paternal Grandmother    Cancer Paternal Grandmother        breast,lung   Cancer Maternal Grandmother 76       kind unknown   Diabetes Maternal Grandmother    Myasthenia gravis Maternal Grandfather    Other Sister        tumor on ovary   ADD / ADHD Son    Heart attack Paternal Grandfather    Multiple sclerosis Other        paternal great aunt   Breast cancer Neg Hx     SOCIAL HISTORY: Social History   Socioeconomic History   Marital status: Divorced    Spouse name: Divorced  Number of children: 1   Years of education: college   Highest education level: Not on file  Occupational History   Occupation: disabled  Tobacco Use   Smoking status: Former    Packs/day: 1.00    Years: 20.00    Total pack years: 20.00    Types: Cigarettes    Quit date: 02/15/2012    Years since quitting: 10.7   Smokeless tobacco: Never  Vaping Use   Vaping Use: Never used  Substance and Sexual Activity   Alcohol use: No    Alcohol/week: 0.0 standard drinks of alcohol   Drug use: No   Sexual activity: Never    Birth control/protection: None  Other Topics Concern   Not on file  Social History Narrative   Patient is divorced and lives at home and he son lives with her.   Patient has one child.   Patient is disabled.    Patient has some college education.   Patient is right-handed.   Patient drinks 3 servings of caffeine daily      Social Determinants of Health   Financial Resource Strain: Low Risk  (05/10/2022)   Overall Financial Resource Strain (CARDIA)    Difficulty of Paying Living Expenses: Not hard at all  Food Insecurity: No Food Insecurity (05/10/2022)   Hunger Vital Sign    Worried About Running Out of Food in the Last Year: Never true    Ran Out of Food in the Last Year: Never true  Transportation Needs: No Transportation Needs (05/10/2022)   PRAPARE - Hydrologist (Medical): No    Lack of Transportation (Non-Medical): No  Physical Activity: Inactive (05/10/2022)   Exercise Vital Sign    Days of Exercise per Week: 0 days    Minutes of Exercise per Session: 0 min  Stress: No Stress Concern Present (05/10/2022)   [place]    Feeling of Stress : Not at all  Social Connections: Moderately Integrated (05/10/2022)   Social Connection and Isolation Panel [NHANES]    Frequency of Communication with Friends and Family: More than three times a week    Frequency of Social Gatherings with Friends and Family: More than three times a week    Attends Religious Services: More than 4 times per year    Active Member of [employer] or Organizations: Yes    Attends Archivist Meetings: More than 4 times per year    Marital Status: Divorced  Intimate Partner Violence: Not At Risk (05/10/2022)   Humiliation, Afraid, Rape, and Kick questionnaire    Fear of Current or Ex-Partner: No    Emotionally Abused: No    Physically Abused: No    Sexually Abused: No      PHYSICAL EXAM  Vitals:   11/29/22 0846  BP: (!) 142/81  Pulse: 69  Weight: 269 lb 3.2 oz (122.1 kg)  Height: '5\' 8"'$  (1.727 m)    Body mass index is 40.93 kg/m.  Generalized: Well developed, in no acute distress ,  Neck size : 16"  Mallampati  : 3 plus, functional macroglosia.    Neurological examination  Mentation: Alert oriented to time, place, history taking. Follows all commands, her  speech is unaffected 9 no stammer, no dysphonia , no dysarthria ) and language is fluent Cranial nerve:  normal sense of smell and taste -   Extraocular movements were full, without nystagmus and without INO-  visual field were full on  confrontational test. Head turning and shoulder shrug  were normal and symmetric. Hearing intact.  Motor: full strength of all  left extremities with the exception of slightly weaker in the right lower extremity.   Right grip strength is reduced over left.  Sensory:  finger berries are numb to fine touch, vibration was felt in all extremities, ankle and patella and wrist. Coordination: dysmetria of the right finger to nose maneuver- satellitic.  Gait and station: Patient has a slight limp when ambulating, her right leg is stiffer, the foot is rotated outwards when walking, turing with 4 steps, not 3. Tip toe stand is unsteady. Poor balance . Reflexes: Deep tendon reflexes were ore brisk on the right than left , up-going right hallux.      DIAGNOSTIC DATA (LABS, IMAGING, TESTING) - I reviewed patient records, labs, notes, testing and imaging myself where available.  Lab Results  Component Value Date   WBC 9.0 08/27/2021   HGB 14.2 08/27/2021   HCT 42.4 08/27/2021   MCV 88.3 08/27/2021   PLT 299 08/27/2021      Component Value Date/Time   NA 137 08/27/2021 1508   NA 140 05/05/2021 1405   K 4.0 08/27/2021 1508   K 4.2 02/28/2012 1113   CL 103 08/27/2021 1508   CL 107 02/28/2012 1113   CO2 28 08/27/2021 1508   CO2 23 02/28/2012 1113   GLUCOSE 92 08/27/2021 1508   BUN 14 08/27/2021 1508   BUN 12 05/05/2021 1405   CREATININE 0.58 08/27/2021 1508   CREATININE 0.73 02/28/2012 1113   CALCIUM 8.9 08/27/2021 1508   CALCIUM 9.5 02/28/2012 1113   PROT 6.6 08/27/2021 1508   PROT 6.5 05/05/2021 1405   PROT  6.4 02/28/2012 1113   ALBUMIN 3.7 08/27/2021 1508   ALBUMIN 4.3 05/05/2021 1405   AST 17 08/27/2021 1508   AST 14 02/28/2012 1113   ALT 23 08/27/2021 1508   ALKPHOS 43 08/27/2021 1508   ALKPHOS 49 02/28/2012 1113   BILITOT 0.6 08/27/2021 1508   BILITOT 0.2 05/05/2021 1405   GFRNONAA >60 08/27/2021 1508   GFRAA 122 11/23/2020 1512   Lab Results  Component Value Date   CHOL 166 11/03/2021   HDL 51 11/03/2021   LDLCALC 95 11/03/2021   TRIG 108 11/03/2021   CHOLHDL 3.3 11/03/2021    Lab Results  Component Value Date   TSH 3.700 12/09/2014   This was a 30 minute RV with physical exam, review of imaging studies and interval history:    ASSESSMENT  42 y.o. year old female  has a past medical history of Anxiety, Depression, Enlarged heart, Fibroid (04/24/2016), GERD (gastroesophageal reflux disease), Leaky heart valve, Menorrhagia with irregular cycle (04/13/2016), Multiple sclerosis ([place]), Multiple sclerosis exacerbation ([facility]), Numbness in right leg (02/13,12/12), Obesity (BMI 35.0-39.9 without comorbidity), Other malaise and fatigue (10/20/2013), Ovarian cyst (04/24/2016), and Regurgitation. here with:  1.  MS -Multiple sclerosis, relapsing remitting. On Zeposia. New symptoms of left sided burning. This may be a sign of a relapse and needs investigation.     2. RLS  - MM had Increased mirapex to 0.25 TID, good success reported. No changes made. She feels ore fatigued than sleepy. No insomnia.    3.NEUROGENIC BLADDER ,  Treated with stimulator.    4. Obesity , partially steroid induced.  Could not tolerate Ozempic and gained weight. May tolerate Mounjaro better?  Is not attending the gym currently.     PLAN: MRI brain and cervical and thoracic spine  to be repeated with and without gadolinium. Her implanted stimulator is MRI COMPATIBLE . If the MRI is abnormal with fresh lesions, initiate steroid IV treatments.    -Follow-up in 4- 6 months with MM or sooner if  needed   Larey Seat, MD 11/29/2022, 9:39 AM [facility] [address] [place], [place] [address] [phone]

## 2022-11-29 NOTE — Telephone Encounter (Signed)
[employer] medicare/[place] medicaid NPR sent to [facility] [phone] 

## 2022-11-30 DIAGNOSIS — R339 Retention of urine, unspecified: Secondary | ICD-10-CM | POA: Diagnosis not present

## 2022-11-30 LAB — CBC WITH DIFFERENTIAL/PLATELET
Basophils Absolute: 0.1 10*3/uL (ref 0.0–0.2)
Basos: 1 %
EOS (ABSOLUTE): 0.3 10*3/uL (ref 0.0–0.4)
Eos: 3 %
Hematocrit: 42.4 % (ref 34.0–46.6)
Hemoglobin: 14.4 g/dL (ref 11.1–15.9)
Immature Grans (Abs): 0 10*3/uL (ref 0.0–0.1)
Immature Granulocytes: 0 %
Lymphocytes Absolute: 1.2 10*3/uL (ref 0.7–3.1)
Lymphs: 13 %
MCH: 28.7 pg (ref 26.6–33.0)
MCHC: 34 g/dL (ref 31.5–35.7)
MCV: 85 fL (ref 79–97)
Monocytes Absolute: 0.5 10*3/uL (ref 0.1–0.9)
Monocytes: 6 %
Neutrophils Absolute: 7.1 10*3/uL — ABNORMAL HIGH (ref 1.4–7.0)
Neutrophils: 77 %
Platelets: 350 10*3/uL (ref 150–450)
RBC: 5.02 x10E6/uL (ref 3.77–5.28)
RDW: 12.7 % (ref 11.7–15.4)
WBC: 9.2 10*3/uL (ref 3.4–10.8)

## 2022-11-30 LAB — COMPREHENSIVE METABOLIC PANEL
ALT: 15 IU/L (ref 0–32)
AST: 13 IU/L (ref 0–40)
Albumin/Globulin Ratio: 2.2 (ref 1.2–2.2)
Albumin: 4.4 g/dL (ref 3.9–4.9)
Alkaline Phosphatase: 56 IU/L (ref 44–121)
BUN/Creatinine Ratio: 16 (ref 9–23)
BUN: 10 mg/dL (ref 6–24)
Bilirubin Total: 0.2 mg/dL (ref 0.0–1.2)
CO2: 24 mmol/L (ref 20–29)
Calcium: 9.5 mg/dL (ref 8.7–10.2)
Chloride: 105 mmol/L (ref 96–106)
Creatinine, Ser: 0.64 mg/dL (ref 0.57–1.00)
Globulin, Total: 2 g/dL (ref 1.5–4.5)
Glucose: 93 mg/dL (ref 70–99)
Potassium: 5 mmol/L (ref 3.5–5.2)
Sodium: 141 mmol/L (ref 134–144)
Total Protein: 6.4 g/dL (ref 6.0–8.5)
eGFR: 112 mL/min/{1.73_m2} (ref >59)

## 2022-12-04 ENCOUNTER — Encounter: Payer: Self-pay | Admitting: Neurology

## 2022-12-22 DIAGNOSIS — G35 Multiple sclerosis: Secondary | ICD-10-CM | POA: Diagnosis not present

## 2022-12-22 DIAGNOSIS — M5136 Other intervertebral disc degeneration, lumbar region: Secondary | ICD-10-CM | POA: Diagnosis not present

## 2023-01-02 DIAGNOSIS — R339 Retention of urine, unspecified: Secondary | ICD-10-CM | POA: Diagnosis not present

## 2023-01-04 ENCOUNTER — Ambulatory Visit
Admission: RE | Admit: 2023-01-04 | Discharge: 2023-01-04 | Disposition: A | Discharge status: Home / Self Care | Payer: 59 | Source: Ambulatory Visit | Attending: Neurology | Admitting: Neurology

## 2023-01-04 DIAGNOSIS — G35 Multiple sclerosis: Secondary | ICD-10-CM | POA: Diagnosis not present

## 2023-01-04 DIAGNOSIS — R208 Other disturbances of skin sensation: Secondary | ICD-10-CM | POA: Diagnosis not present

## 2023-01-04 DIAGNOSIS — N319 Neuromuscular dysfunction of bladder, unspecified: Secondary | ICD-10-CM | POA: Diagnosis not present

## 2023-01-04 DIAGNOSIS — G2581 Restless legs syndrome: Secondary | ICD-10-CM | POA: Diagnosis not present

## 2023-01-04 MED ORDER — GADOPICLENOL 0.5 MMOL/ML IV SOLN
10.0000 mL | Freq: Once | INTRAVENOUS | Status: AC | PRN
  Administered 2023-01-04: 10 mL via INTRAVENOUS

## 2023-01-08 ENCOUNTER — Ambulatory Visit
Admission: RE | Admit: 2023-01-08 | Discharge: 2023-01-08 | Disposition: A | Discharge status: Home / Self Care | Payer: 59 | Source: Ambulatory Visit | Attending: Neurology | Admitting: Neurology

## 2023-01-08 DIAGNOSIS — N319 Neuromuscular dysfunction of bladder, unspecified: Secondary | ICD-10-CM | POA: Diagnosis not present

## 2023-01-08 DIAGNOSIS — R208 Other disturbances of skin sensation: Secondary | ICD-10-CM | POA: Diagnosis not present

## 2023-01-08 DIAGNOSIS — G2581 Restless legs syndrome: Secondary | ICD-10-CM | POA: Diagnosis not present

## 2023-01-08 DIAGNOSIS — G35 Multiple sclerosis: Secondary | ICD-10-CM | POA: Diagnosis not present

## 2023-01-08 MED ORDER — GADOPICLENOL 0.5 MMOL/ML IV SOLN
10.0000 mL | Freq: Once | INTRAVENOUS | Status: AC | PRN
  Administered 2023-01-08: 10 mL via INTRAVENOUS

## 2023-01-11 ENCOUNTER — Encounter: Payer: Self-pay | Admitting: Neurology

## 2023-01-15 ENCOUNTER — Other Ambulatory Visit: Payer: Self-pay | Admitting: Neurology

## 2023-01-15 DIAGNOSIS — G35 Multiple sclerosis: Secondary | ICD-10-CM

## 2023-01-15 DIAGNOSIS — Z79899 Other long term (current) drug therapy: Secondary | ICD-10-CM

## 2023-01-15 DIAGNOSIS — G2581 Restless legs syndrome: Secondary | ICD-10-CM

## 2023-01-15 NOTE — Addendum Note (Signed)
Addended by: Darleen Crocker on: 01/15/2023 02:38 PM   Modules accepted: Orders

## 2023-01-16 ENCOUNTER — Other Ambulatory Visit: Payer: Self-pay

## 2023-01-16 DIAGNOSIS — Z79899 Other long term (current) drug therapy: Secondary | ICD-10-CM | POA: Diagnosis not present

## 2023-01-16 DIAGNOSIS — G35 Multiple sclerosis: Secondary | ICD-10-CM

## 2023-01-16 DIAGNOSIS — G2581 Restless legs syndrome: Secondary | ICD-10-CM | POA: Diagnosis not present

## 2023-01-17 DIAGNOSIS — G35 Multiple sclerosis: Secondary | ICD-10-CM | POA: Diagnosis not present

## 2023-01-17 LAB — HEPATIC FUNCTION PANEL
ALT: 16 IU/L (ref 0–32)
AST: 13 IU/L (ref 0–40)
Albumin: 4.1 g/dL (ref 3.9–4.9)
Alkaline Phosphatase: 57 IU/L (ref 44–121)
Bilirubin Total: 0.2 mg/dL (ref 0.0–1.2)
Bilirubin, Direct: <0.1 mg/dL (ref 0.00–0.40)
Total Protein: 6.5 g/dL (ref 6.0–8.5)

## 2023-01-18 ENCOUNTER — Other Ambulatory Visit: Payer: Self-pay | Admitting: Cardiovascular Disease

## 2023-01-21 DIAGNOSIS — I10 Essential (primary) hypertension: Secondary | ICD-10-CM | POA: Diagnosis not present

## 2023-02-01 DIAGNOSIS — R339 Retention of urine, unspecified: Secondary | ICD-10-CM | POA: Diagnosis not present

## 2023-02-02 DIAGNOSIS — R634 Abnormal weight loss: Secondary | ICD-10-CM | POA: Diagnosis not present

## 2023-02-02 DIAGNOSIS — G35 Multiple sclerosis: Secondary | ICD-10-CM | POA: Diagnosis not present

## 2023-02-27 IMAGING — MG MM DIGITAL SCREENING BILAT W/ TOMO AND CAD
8 series · 8 of 24 positions shown · non-contrast
Comparison: None.

CLINICAL DATA: Screening.

EXAM:
DIGITAL SCREENING BILATERAL MAMMOGRAM WITH TOMOSYNTHESIS AND CAD
TECHNIQUE: Bilateral screening digital craniocaudal and mediolateral oblique
mammograms were obtained. Bilateral screening digital breast
tomosynthesis was performed. The images were evaluated with
computer-aided detection.

[L MLO synth-2D]
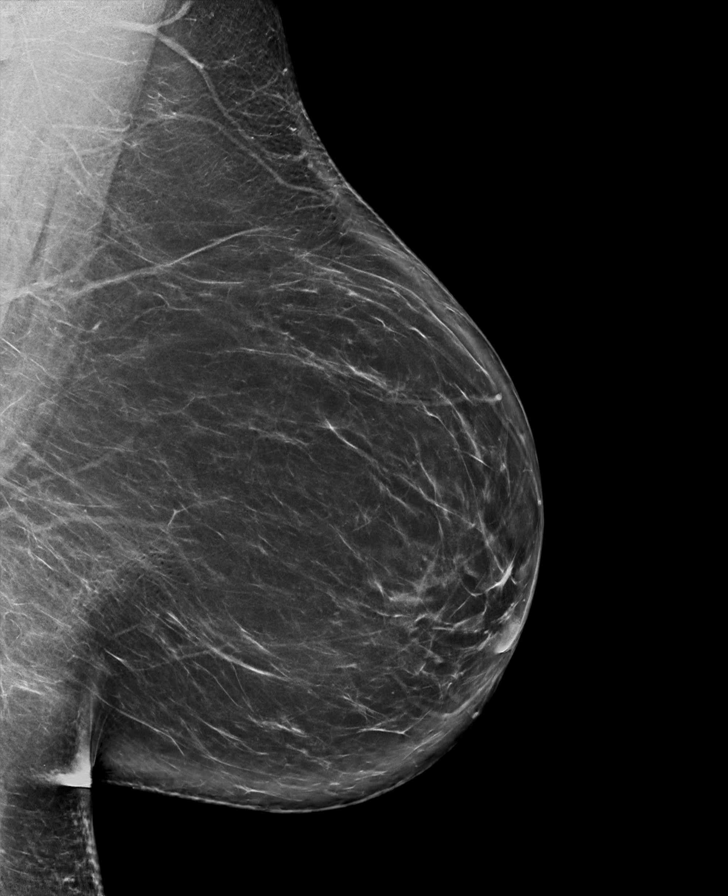

[L CC synth-2D]
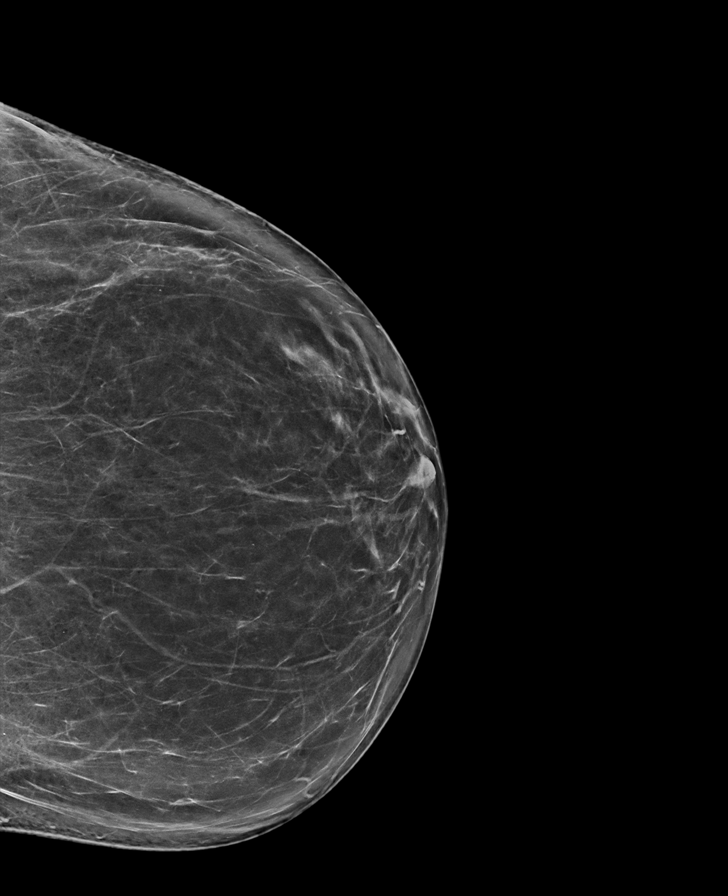

[R CC synth-2D]
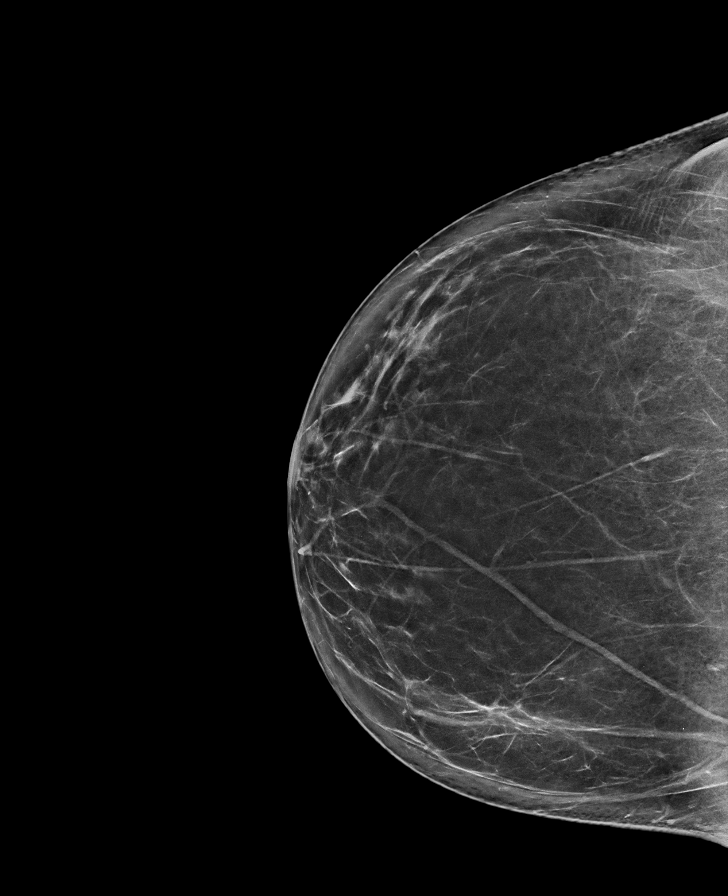

[R MLO synth-2D]
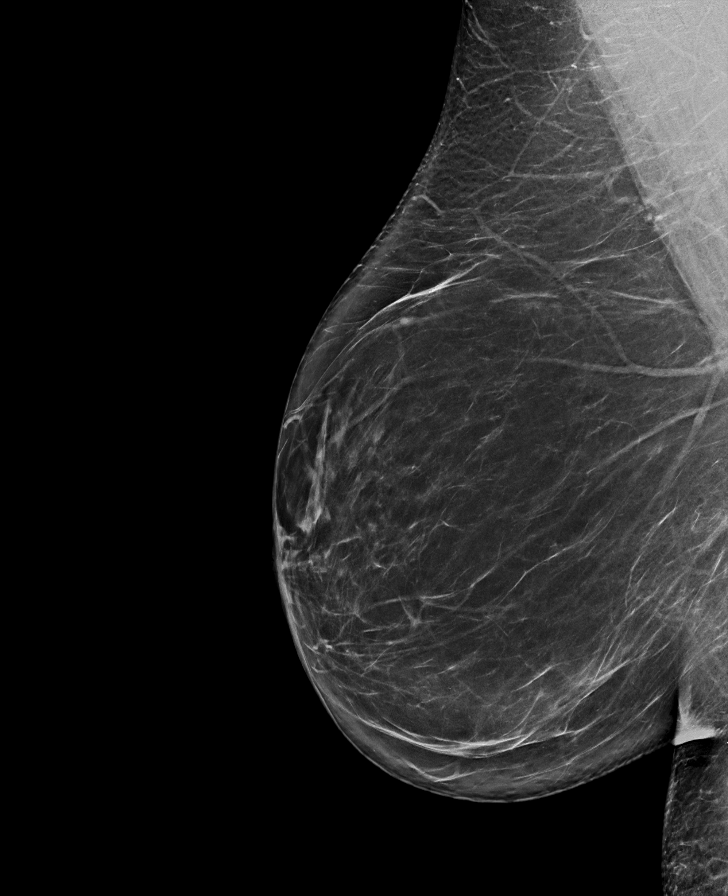

[R CC tomo · tomo slice 40/79.0]
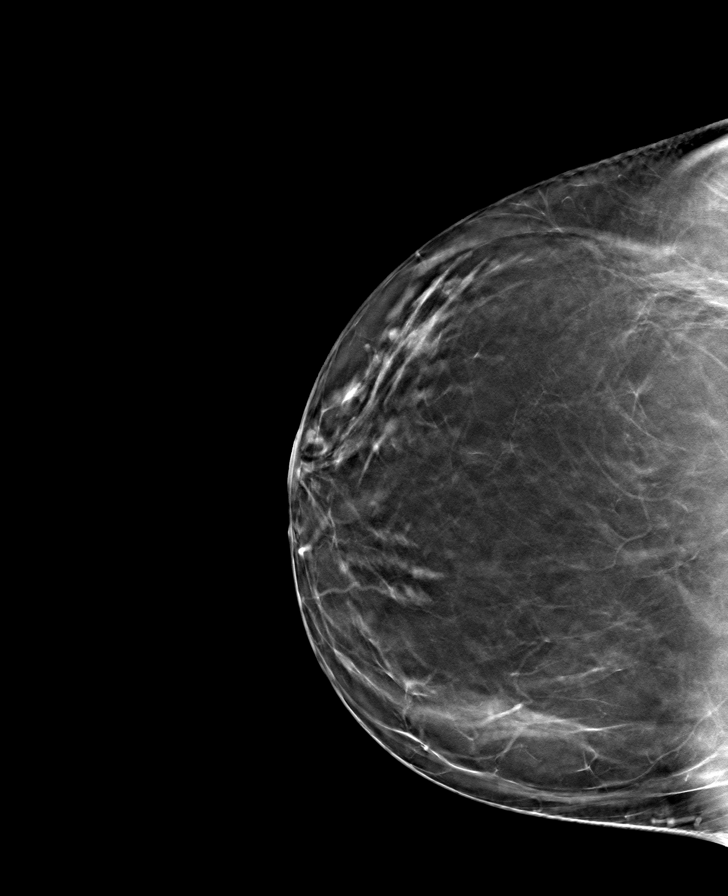

[L MLO tomo · tomo slice 47/92.0]
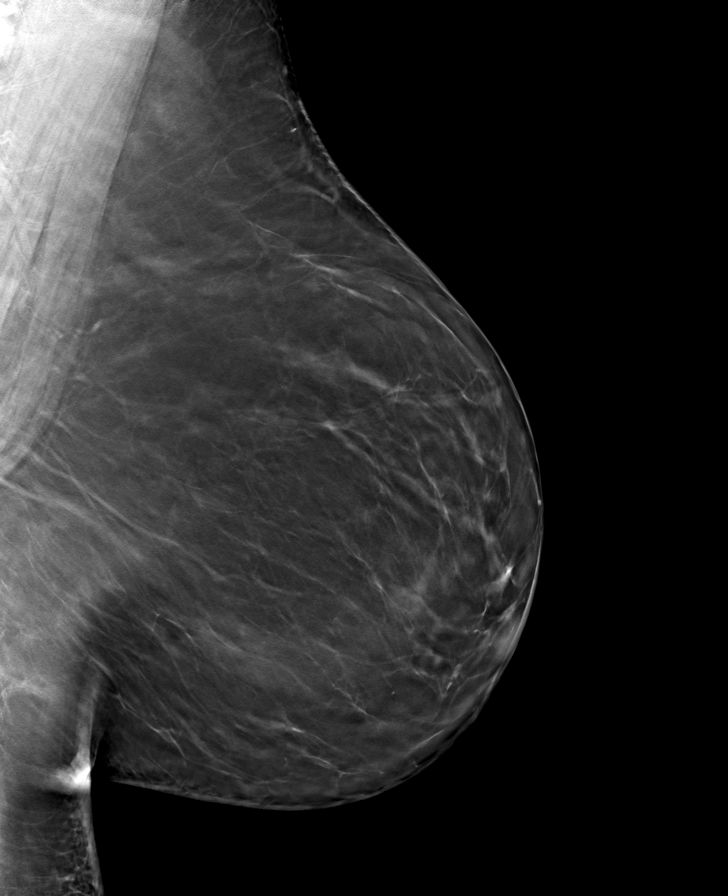

[L CC tomo · tomo slice 41/80.0]
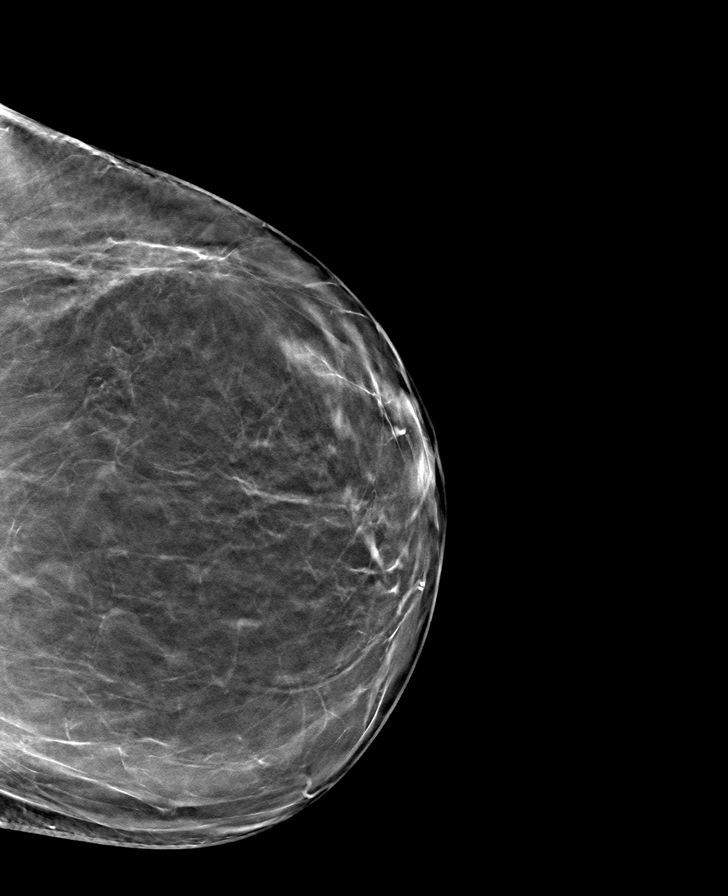

[R MLO tomo · tomo slice 44/87.0]
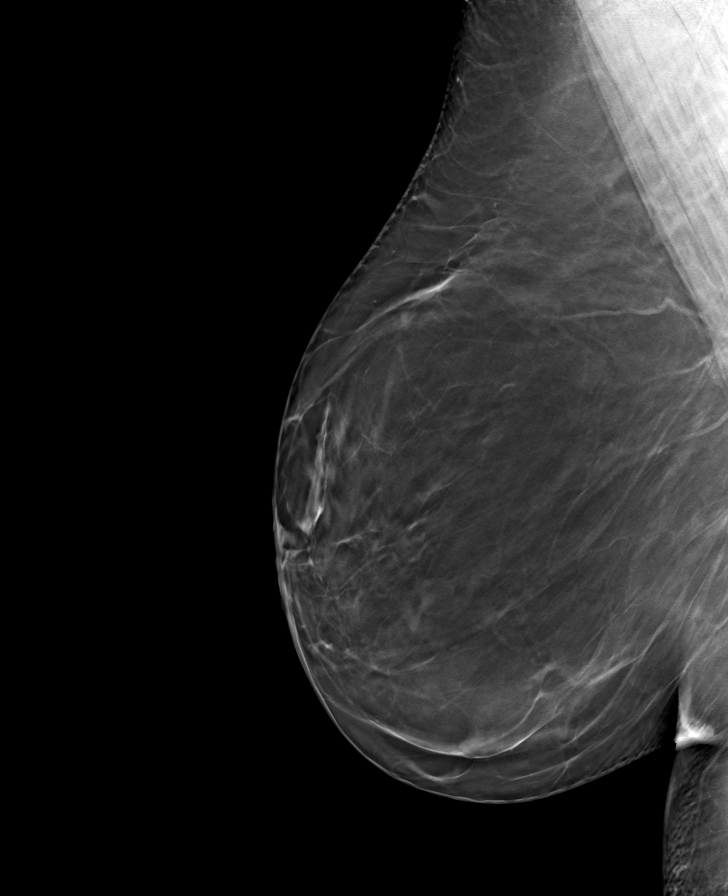

[8 of 24 positions shown; findings below may reference images not displayed]

ACR Breast Density Category b: There are scattered areas of
fibroglandular density.
FINDINGS: There are no findings suspicious for malignancy. The images were
evaluated with computer-aided detection.
IMPRESSION: No mammographic evidence of malignancy. A result letter of this
screening mammogram will be mailed directly to the patient.

RECOMMENDATION:
Screening mammogram in one year. (Code:C7-6-ASJ)

BI-RADS CATEGORY  1: Negative.

## 2023-03-05 DIAGNOSIS — R339 Retention of urine, unspecified: Secondary | ICD-10-CM | POA: Diagnosis not present

## 2023-03-29 ENCOUNTER — Other Ambulatory Visit: Payer: Self-pay | Admitting: Adult Health

## 2023-04-06 DIAGNOSIS — R339 Retention of urine, unspecified: Secondary | ICD-10-CM | POA: Diagnosis not present

## 2023-04-10 DIAGNOSIS — Z720 Tobacco use: Secondary | ICD-10-CM | POA: Diagnosis not present

## 2023-04-10 DIAGNOSIS — Z79899 Other long term (current) drug therapy: Secondary | ICD-10-CM | POA: Diagnosis not present

## 2023-04-10 DIAGNOSIS — Z0000 Encounter for general adult medical examination without abnormal findings: Secondary | ICD-10-CM | POA: Diagnosis not present

## 2023-04-10 DIAGNOSIS — E7800 Pure hypercholesterolemia, unspecified: Secondary | ICD-10-CM | POA: Diagnosis not present

## 2023-04-10 DIAGNOSIS — Z299 Encounter for prophylactic measures, unspecified: Secondary | ICD-10-CM | POA: Diagnosis not present

## 2023-04-10 DIAGNOSIS — E559 Vitamin D deficiency, unspecified: Secondary | ICD-10-CM | POA: Diagnosis not present

## 2023-04-10 DIAGNOSIS — R5383 Other fatigue: Secondary | ICD-10-CM | POA: Diagnosis not present

## 2023-04-10 DIAGNOSIS — Z7189 Other specified counseling: Secondary | ICD-10-CM | POA: Diagnosis not present

## 2023-04-10 DIAGNOSIS — I10 Essential (primary) hypertension: Secondary | ICD-10-CM | POA: Diagnosis not present

## 2023-04-10 DIAGNOSIS — G35 Multiple sclerosis: Secondary | ICD-10-CM | POA: Diagnosis not present

## 2023-05-10 DIAGNOSIS — R339 Retention of urine, unspecified: Secondary | ICD-10-CM | POA: Diagnosis not present

## 2023-05-24 ENCOUNTER — Other Ambulatory Visit: Payer: Self-pay | Admitting: Orthopaedic Surgery

## 2023-06-05 ENCOUNTER — Other Ambulatory Visit: Payer: Self-pay | Admitting: Internal Medicine

## 2023-06-05 DIAGNOSIS — Z1231 Encounter for screening mammogram for malignant neoplasm of breast: Secondary | ICD-10-CM

## 2023-06-12 DIAGNOSIS — R339 Retention of urine, unspecified: Secondary | ICD-10-CM | POA: Diagnosis not present

## 2023-06-14 ENCOUNTER — Other Ambulatory Visit: Payer: Self-pay | Admitting: Adult Health

## 2023-06-14 ENCOUNTER — Other Ambulatory Visit: Payer: Self-pay | Admitting: Neurology

## 2023-06-14 NOTE — Telephone Encounter (Signed)
Last seen on 11/29/22 Follow up scheduled on 06/18/23 Last filled on 01/18/23 #30 tablets (30 day supply) Rx pending to be signed

## 2023-06-17 NOTE — Progress Notes (Unsigned)
PATIENT: [name] DOB: August 23, 1981  REASON FOR VISIT: follow up HISTORY FROM: patient Primary neurologist: Dr. Vickey Huger  No chief complaint on file.    HISTORY OF PRESENT ILLNESS: Today 06/17/23:  [name] is a 42 y.o. female with a history of Multiple Sclerosis. Returns today for follow-up.      05/11/22: [place] is a 42 year old female with a history of multiple sclerosis.  She returns today for follow-up.  She remains on Zeposia.   Reports that she is tolerating the medication well.  Denies any new numbness or weakness.  No changes in her gait or balance.  Reports that her legs ache but better after she takes mirapex. Denies any changes with her vision.  She continues to be followed by urology for chronic urinary tract infections and difficulty emptying bladder.  Now has a bladder stimulator- in march. Only had to self cath 6 times since them.   11/10/21: [name] is a 42 year old female with a history of multiple sclerosis.  She returns today for follow-up.  She continues on Zeposia.  Reports that this continues to work well for her.  She states that she continues to have ongoing back pain that is managed by neurosurgery.  She did try a nerve block but it was not helpful.  Recently started on Flexeril and has found that beneficial.  She continues to see urology for chronic urinary tract infections and difficulty emptying her bladder.  She has to self cath up to 3-4 times a day.  They are thinking about putting in a bladder stimulator.  She reports that it will be compatible for MRI.  She denies any new numbness or tingling.  No changes with her gait or balance.  No changes with her vision.  Recently had blood work in November and reports that she had blood work this morning with her urologist.  05/05/21: [name] is a 42 year old female with a history of multiple sclerosis.  She returns today for follow-up.  She has been started on Zeposia she states that since she  started this medication her back pain has tripled.  Reports that she is always had low back pain.  We had back surgery in 2006.  However she has noticed that since she started this medication her pain has tripled.  She denies any new numbness or tingling.  No changes with her gait or balance.  No changes with the bowels or bladder-continues to self cath.  She returns today for an evaluation.  12/22/20: [name] is a 42 year old female with a history of multiple sclerosis.  She returns today to discuss MRI results.  MRI of the brain did show progression  And an active demyelination.  The patient has been on Plegridy.  She has noticed some weakness in her right leg and right hand.   MRI brain with and without contrast December 10, 2020:  IMPRESSION: New subcentimeter focus of enhancement in the right frontal periventricular white matter most consistent with active demyelination. Otherwise stable moderate burden of chronic demyelination. Stable parenchymal volume loss.  MRI cervical spine with and without contrast December 10, 2020:  IMPRESSION: Likely no substantial change in chronic demyelinating cord lesions. No definite new lesion or abnormal enhancement.    11/23/20: [name] is a 42 year old female with a history of multiple sclerosis and restless leg syndrome.  She returns today for follow-up.  She remains on Plegridy.patient reports that since October she has noticed more weakness in the right leg.  She states that she has been under a lot of stress as her father passed away in 08-16-2023 and she has had to move.  No changes with the bowels or bladder-continues to self cath.  She plans to follow-up with her urologist to discuss any medication options.  Reports that she has had minimal changes in her gait mainly due to increased weakness in the right leg..  Denies any changes in her vision.  Remains on Mirapex for restless leg.  She returns today for an evaluation.    Imaging  09/22/2019:  MRI cervical spine with and without contrast: IMPRESSION: This MRI of the cervical spine with and without contrast shows the following: 1.   Multiple T2 hyperintense foci within the spinal cord as detailed above consistent with chronic demyelinating plaque associated with multiple sclerosis.  None of the foci enhanced.  Compared to the MRI dated 02/15/2017, there is no interval change. 2.   There is a normal enhancement pattern.  MRI brain with and without contrast:  IMPRESSION: This MRI of the brain with and without contrast shows the following: 1.   There are multiple T2/FLAIR hyperintense foci in the hemispheres, brainstem and left middle cerebellar peduncle in a pattern and configuration consistent with chronic demyelinating plaque associated with multiple sclerosis.  None of the foci appears to be acute and they do not enhance.  Compared to the MRI dated 02/15/2017, there is no interval change. 2.   There are no acute findings and there is a normal enhancement pattern.  HISTORY 02/19/20: [name] is a 42 year old female with a history of multiple sclerosis and restless leg syndrome.  She returns today for follow-up.  She remains on Plegridy.  She did have a potential exacerbation in March.  She received IV Solu-Medrol with good benefit.  Reports that her symptoms have been stable since then.  She denies any new numbness or weakness.  Denies any changes with her gait or balance.  She continues to have the self cath.  Remains on Mirapex for restless leg.  She returns today for an evaluation.  HISTORY  08/20/19:   [name] is a 42 year old female with a history of multiple sclerosis and restless leg syndrome.  She returns today for follow-up.  She continues on Plegridy.  She continues to tolerate this medication well.  She denies any new numbness or weakness.  Denies any changes with her gait or balance.  She is now having to self cath up to 3 times a day.  She has not seen Dr.  Ashley Royalty in 2 years.  She states that she keeps a urinary tract infection despite being on preventative medication.  No change in her vision.  Her last MRI of the brain and cervical spine was in 2018.  She reports that Mirapex has been beneficial for restless leg syndrome.  She continues to take 0.25 mg at dinnertime and at bedtime.  She returns today for evaluation.  REVIEW OF SYSTEMS: Out of a complete 14 system review of symptoms, the patient complains only of the following symptoms, and all other reviewed systems are negative.  See HPI  ALLERGIES: Allergies  Allergen Reactions   Diclofenac Anaphylaxis   Zofran [Ondansetron Hcl] Other (See Comments)    Prolonged QT - unable to have zofran per report   Propoxyphene N-Acetaminophen Rash    HOME MEDICATIONS: Outpatient Medications Prior to Visit  Medication Sig Dispense Refill   ALPRAZolam (XANAX) 1 MG tablet TAKE ONE-HALF TABLET BY MOUTH TWICE A DAY  AS NEEDED FOR ANXIETY 30 tablet 5   chlorthalidone (HYGROTON) 25 MG tablet 25 mg daily.      cyclobenzaprine (FLEXERIL) 10 MG tablet Take 10 mg by mouth 3 (three) times daily as needed for muscle spasms.     levocetirizine (XYZAL) 5 MG tablet Take 5 mg by mouth every evening.     pramipexole (MIRAPEX) 0.25 MG tablet Take 1 tablet (0.25 mg total) by mouth 3 (three) times daily. 270 tablet 3   predniSONE (DELTASONE) 20 MG tablet Take 2 tablets (40 mg total) by mouth daily with breakfast. 8 tablet 0   rosuvastatin (CRESTOR) 10 MG tablet Take 1 tablet (10 mg total) by mouth daily. Pt needs to make appt with provider for further refills - 1st attempt 30 tablet 0   sertraline (ZOLOFT) 100 MG tablet Take 100 mg by mouth daily.     ZEPOSIA 0.92 MG CAPS TAKE 1 CAPSULE (0.92MG ) BY MOUTH DAILY 90 capsule 1   No facility-administered medications prior to visit.    PAST MEDICAL HISTORY: Past Medical History:  Diagnosis Date   Anxiety    Depression    Enlarged heart    Fibroid 04/24/2016   GERD  (gastroesophageal reflux disease)    Leaky heart valve    Menorrhagia with irregular cycle 04/13/2016   Multiple sclerosis (HCC)    Dr Wynonia Hazard   Multiple sclerosis exacerbation (HCC)    Numbness in right leg 02/13,12/12   Obesity (BMI 35.0-39.9 without comorbidity)    Other malaise and fatigue 10/20/2013   Ovarian cyst 04/24/2016   Regurgitation    cardiac valve. echo 2013 for first eval GILENYA,mod MR,TR    PAST SURGICAL HISTORY: Past Surgical History:  Procedure Laterality Date   BACK SURGERY  2006   CESAREAN SECTION  2000   CHOLECYSTECTOMY  2010   COLONOSCOPY  09/14/2010   Fields-hyperplastic polyps, int hemorrhoids   CYST EXCISION  2006   pilonoidal   DILITATION & CURRETTAGE/HYSTROSCOPY WITH NOVASURE ABLATION N/A 07/21/2016   Procedure: DILATATION & CURETTAGE/HYSTEROSCOPY WITH NOVASURE ABLATION;  Surgeon: Tilda Burrow, MD;  Location: [facility];  Service: Gynecology;  Laterality: N/A;  Novasure lenght - 5.0 width - 4.1 power - 113 time - 2 min 03 seconds   ESOPHAGOGASTRODUODENOSCOPY  08/05/2008   Fields-incomplete Schatzki's ring, distal esophageal erosions/esophagitis(mild), small HH, NEGATIVE h pylori   LAPAROSCOPIC APPENDECTOMY N/A 03/26/2018   Procedure: APPENDECTOMY LAPAROSCOPIC;  Surgeon: Franky Macho, MD;  Location: [facility];  Service: General;  Laterality: N/A;   LAPAROSCOPIC BILATERAL SALPINGECTOMY Bilateral 07/21/2016   Procedure: LAPAROSCOPIC RIGHT SALPINGECTOMY AND LEFT OOPHECTOMY;  Surgeon: Tilda Burrow, MD;  Location: [facility];  Service: Gynecology;  Laterality: Bilateral;   LUMBAR DISC SURGERY     US ECHOCARDIOGRAPHY  03/19/2012   RV mildly dilated,RA mod. dilated,strial septum aneurysmal,Mod. MR & TR    FAMILY HISTORY: Family History  Problem Relation Age of Onset   Other Mother        lost leg, has trouble swallowing   Thyroid nodules Mother    Heart attack Father    Coronary artery disease Father    Diabetes Father    Rheum arthritis Father     Colon cancer Paternal Uncle 54   Colon cancer Paternal Grandmother 31   Diabetes Paternal Grandmother    Cancer Paternal Grandmother        breast,lung   Cancer Maternal Grandmother 57       kind unknown   Diabetes Maternal Grandmother  Myasthenia gravis Maternal Grandfather    Other Sister        tumor on ovary   ADD / ADHD Son    Heart attack Paternal Grandfather    Multiple sclerosis Other        paternal great aunt   Breast cancer Neg Hx     SOCIAL HISTORY: Social History   Socioeconomic History   Marital status: Divorced    Spouse name: Divorced    Number of children: 1   Years of education: college   Highest education level: Not on file  Occupational History   Occupation: disabled  Tobacco Use   Smoking status: Former    Current packs/day: 0.00    Average packs/day: 1 pack/day for 20.0 years (20.0 ttl pk-yrs)    Types: Cigarettes    Start date: 02/15/1992    Quit date: 02/15/2012    Years since quitting: 11.3   Smokeless tobacco: Never  Vaping Use   Vaping status: Never Used  Substance and Sexual Activity   Alcohol use: No    Alcohol/week: 0.0 standard drinks of alcohol   Drug use: No   Sexual activity: Never    Birth control/protection: None  Other Topics Concern   Not on file  Social History Narrative   Patient is divorced and lives at home and he son lives with her.   Patient has one child.   Patient is disabled.   Patient has some college education.   Patient is right-handed.   Patient drinks 3 servings of caffeine daily      Social Determinants of Health   Financial Resource Strain: Low Risk  (05/10/2022)   Overall Financial Resource Strain (CARDIA)    Difficulty of Paying Living Expenses: Not hard at all  Food Insecurity: No Food Insecurity (05/10/2022)   Hunger Vital Sign    Worried About Running Out of Food in the Last Year: Never true    Ran Out of Food in the Last Year: Never true  Transportation Needs: No Transportation Needs  (05/10/2022)   PRAPARE - Administrator, Civil Service (Medical): No    Lack of Transportation (Non-Medical): No  Physical Activity: Inactive (05/10/2022)   Exercise Vital Sign    Days of Exercise per Week: 0 days    Minutes of Exercise per Session: 0 min  Stress: No Stress Concern Present (05/10/2022)   [employer] of Occupational Health - Occupational Stress Questionnaire    Feeling of Stress : Not at all  Social Connections: Moderately Integrated (05/10/2022)   Social Connection and Isolation Panel [NHANES]    Frequency of Communication with Friends and Family: More than three times a week    Frequency of Social Gatherings with Friends and Family: More than three times a week    Attends Religious Services: More than 4 times per year    Active Member of [employer] or Organizations: Yes    Attends Engineer, structural: More than 4 times per year    Marital Status: Divorced  Intimate Partner Violence: Not At Risk (05/10/2022)   Humiliation, Afraid, Rape, and Kick questionnaire    Fear of Current or Ex-Partner: No    Emotionally Abused: No    Physically Abused: No    Sexually Abused: No      PHYSICAL EXAM  There were no vitals filed for this visit.   There is no height or weight on file to calculate BMI.  Generalized: Well developed, in no acute distress  Neurological examination  Mentation: Alert oriented to time, place, history taking. Follows all commands speech and language fluent Cranial nerve II-XII: . Extraocular movements were full, visual field were full on confrontational test. . Head turning and shoulder shrug  were normal and symmetric. Motor: The motor testing reveals 5 over 5 strength of all 4 extremities with the exception of slightly weaker in the right lower extremity.  Good symmetric motor tone is noted throughout.  Sensory: Sensory testing is intact to soft touch on all 4 extremities. No evidence of extinction is noted.  Coordination:  Cerebellar testing reveals good finger-nose-finger and heel-to-shin bilaterally.  Gait and station: Patient has a slight limp when ambulating. Reflexes: Deep tendon reflexes are symmetric and normal bilaterally.   DIAGNOSTIC DATA (LABS, IMAGING, TESTING) - I reviewed patient records, labs, notes, testing and imaging myself where available.  Lab Results  Component Value Date   WBC 9.2 11/29/2022   HGB 14.4 11/29/2022   HCT 42.4 11/29/2022   MCV 85 11/29/2022   PLT 350 11/29/2022      Component Value Date/Time   NA 141 11/29/2022 1027   K 5.0 11/29/2022 1027   K 4.2 02/28/2012 1113   CL 105 11/29/2022 1027   CL 107 02/28/2012 1113   CO2 24 11/29/2022 1027   CO2 23 02/28/2012 1113   GLUCOSE 93 11/29/2022 1027   GLUCOSE 92 08/27/2021 1508   BUN 10 11/29/2022 1027   CREATININE 0.64 11/29/2022 1027   CREATININE 0.73 02/28/2012 1113   CALCIUM 9.5 11/29/2022 1027   CALCIUM 9.5 02/28/2012 1113   PROT 6.5 01/16/2023 1420   PROT 6.4 02/28/2012 1113   ALBUMIN 4.1 01/16/2023 1420   AST 13 01/16/2023 1420   AST 14 02/28/2012 1113   ALT 16 01/16/2023 1420   ALKPHOS 57 01/16/2023 1420   ALKPHOS 49 02/28/2012 1113   BILITOT 0.2 01/16/2023 1420   GFRNONAA >60 08/27/2021 1508   GFRAA 122 11/23/2020 1512   Lab Results  Component Value Date   CHOL 166 11/03/2021   HDL 51 11/03/2021   LDLCALC 95 11/03/2021   TRIG 108 11/03/2021   CHOLHDL 3.3 11/03/2021    Lab Results  Component Value Date   TSH 3.700 12/09/2014      ASSESSMENT AND PLAN 42 y.o. year old female  has a past medical history of Anxiety, Depression, Enlarged heart, Fibroid (04/24/2016), GERD (gastroesophageal reflux disease), Leaky heart valve, Menorrhagia with irregular cycle (04/13/2016), Multiple sclerosis (HCC), Multiple sclerosis exacerbation (HCC), Numbness in right leg (02/13,12/12), Obesity (BMI 35.0-39.9 without comorbidity), Other malaise and fatigue (10/20/2013), Ovarian cyst (04/24/2016), and Regurgitation.  here with:  1.  Multiple sclerosis   - Continue Zeposia - Had blood work with her PCP- will fax report -Patient asking about weight loss-we discussed Ozempic according to clinical key there is no interaction between Zeposia and Ozempic  2.  RLS  - Increase mirapex to 0.25 TID   -Follow-up in 6 months months or sooner if needed  Advised if symptoms worsen or she develops new symptoms she should let us know follow-up in 6 months or sooner if needed   Butch Penny, MSN, NP-C 06/17/2023, 2:54 PM [facility] [address] [place], [place] [address] [phone]

## 2023-06-18 ENCOUNTER — Encounter: Payer: Self-pay | Admitting: Adult Health

## 2023-06-18 ENCOUNTER — Ambulatory Visit (INDEPENDENT_AMBULATORY_CARE_PROVIDER_SITE_OTHER): Payer: 59 | Admitting: Adult Health

## 2023-06-18 ENCOUNTER — Telehealth: Payer: Self-pay | Admitting: Adult Health

## 2023-06-18 VITALS — BP 133/85 | HR 66 | Ht 68.0 in | Wt 256.0 lb

## 2023-06-18 DIAGNOSIS — E559 Vitamin D deficiency, unspecified: Secondary | ICD-10-CM

## 2023-06-18 DIAGNOSIS — R5383 Other fatigue: Secondary | ICD-10-CM

## 2023-06-18 DIAGNOSIS — G35 Multiple sclerosis: Secondary | ICD-10-CM | POA: Diagnosis not present

## 2023-06-18 NOTE — Telephone Encounter (Signed)
[employer] medicare/Minor medicaid NPR sent to GI [phone] 

## 2023-06-19 ENCOUNTER — Encounter: Payer: Self-pay | Admitting: Adult Health

## 2023-06-19 LAB — CBC WITH DIFFERENTIAL/PLATELET
Basophils Absolute: 0.1 10*3/uL (ref 0.0–0.2)
Basos: 1 %
EOS (ABSOLUTE): 0.2 10*3/uL (ref 0.0–0.4)
Eos: 3 %
Hematocrit: 41.2 % (ref 34.0–46.6)
Hemoglobin: 14.1 g/dL (ref 11.1–15.9)
Immature Grans (Abs): 0 10*3/uL (ref 0.0–0.1)
Immature Granulocytes: 0 %
Lymphocytes Absolute: 0.6 10*3/uL — ABNORMAL LOW (ref 0.7–3.1)
Lymphs: 7 %
MCH: 28.4 pg (ref 26.6–33.0)
MCHC: 34.2 g/dL (ref 31.5–35.7)
MCV: 83 fL (ref 79–97)
Monocytes Absolute: 0.6 10*3/uL (ref 0.1–0.9)
Monocytes: 6 %
Neutrophils Absolute: 7.2 10*3/uL — ABNORMAL HIGH (ref 1.4–7.0)
Neutrophils: 83 %
Platelets: 360 10*3/uL (ref 150–450)
RBC: 4.96 x10E6/uL (ref 3.77–5.28)
RDW: 12.8 % (ref 11.7–15.4)
WBC: 8.7 10*3/uL (ref 3.4–10.8)

## 2023-06-19 LAB — COMPREHENSIVE METABOLIC PANEL
ALT: 18 IU/L (ref 0–32)
AST: 17 IU/L (ref 0–40)
Albumin: 4.3 g/dL (ref 3.9–4.9)
Alkaline Phosphatase: 55 IU/L (ref 44–121)
BUN/Creatinine Ratio: 13 (ref 9–23)
BUN: 10 mg/dL (ref 6–24)
Bilirubin Total: 0.4 mg/dL (ref 0.0–1.2)
CO2: 25 mmol/L (ref 20–29)
Calcium: 9.2 mg/dL (ref 8.7–10.2)
Chloride: 105 mmol/L (ref 96–106)
Creatinine, Ser: 0.75 mg/dL (ref 0.57–1.00)
Globulin, Total: 2.1 g/dL (ref 1.5–4.5)
Glucose: 85 mg/dL (ref 70–99)
Potassium: 3.8 mmol/L (ref 3.5–5.2)
Sodium: 144 mmol/L (ref 134–144)
Total Protein: 6.4 g/dL (ref 6.0–8.5)
eGFR: 101 mL/min/{1.73_m2} (ref >59)

## 2023-06-19 LAB — VITAMIN D 25 HYDROXY (VIT D DEFICIENCY, FRACTURES): Vit D, 25-Hydroxy: 29.2 ng/mL — ABNORMAL LOW (ref 30.0–100.0)

## 2023-06-28 DIAGNOSIS — G35 Multiple sclerosis: Secondary | ICD-10-CM | POA: Diagnosis not present

## 2023-07-03 ENCOUNTER — Inpatient Hospital Stay: Admission: RE | Admit: 2023-07-03 | Payer: 59 | Source: Ambulatory Visit

## 2023-07-10 ENCOUNTER — Ambulatory Visit
Admission: RE | Admit: 2023-07-10 | Discharge: 2023-07-10 | Disposition: A | Discharge status: Home / Self Care | Payer: 59 | Source: Ambulatory Visit | Attending: Internal Medicine | Admitting: Internal Medicine

## 2023-07-10 DIAGNOSIS — Z1231 Encounter for screening mammogram for malignant neoplasm of breast: Secondary | ICD-10-CM | POA: Diagnosis not present

## 2023-07-12 DIAGNOSIS — R339 Retention of urine, unspecified: Secondary | ICD-10-CM | POA: Diagnosis not present

## 2023-07-31 ENCOUNTER — Ambulatory Visit
Admission: RE | Admit: 2023-07-31 | Discharge: 2023-07-31 | Disposition: A | Discharge status: Home / Self Care | Payer: 59 | Source: Ambulatory Visit | Attending: Adult Health | Admitting: Adult Health

## 2023-07-31 ENCOUNTER — Ambulatory Visit (HOSPITAL_COMMUNITY)
Admission: RE | Admit: 2023-07-31 | Discharge: 2023-07-31 | Disposition: A | Discharge status: Home / Self Care | Payer: 59 | Source: Ambulatory Visit | Attending: Adult Health | Admitting: Adult Health

## 2023-07-31 DIAGNOSIS — G35 Multiple sclerosis: Secondary | ICD-10-CM

## 2023-07-31 DIAGNOSIS — R5383 Other fatigue: Secondary | ICD-10-CM | POA: Insufficient documentation

## 2023-07-31 DIAGNOSIS — R9082 White matter disease, unspecified: Secondary | ICD-10-CM | POA: Diagnosis not present

## 2023-07-31 MED ORDER — GADOBUTROL 1 MMOL/ML IV SOLN
10.0000 mL | Freq: Once | INTRAVENOUS | Status: AC | PRN
  Administered 2023-07-31: 10 mL via INTRAVENOUS

## 2023-08-06 ENCOUNTER — Encounter: Payer: Self-pay | Admitting: Adult Health

## 2023-08-13 DIAGNOSIS — R339 Retention of urine, unspecified: Secondary | ICD-10-CM | POA: Diagnosis not present

## 2023-08-21 ENCOUNTER — Encounter: Payer: Self-pay | Admitting: Adult Health

## 2023-08-22 ENCOUNTER — Other Ambulatory Visit: Payer: Self-pay | Admitting: Neurology

## 2023-08-22 NOTE — Telephone Encounter (Signed)
This is from the MRI supervision at [facility]: She was scanned at [facility]. The original order was for GI but she has a Bladder Stimulator and they do not have a T/R coil. We could only do the Brain MRI as we do not have  T/R coil for Cervical Spines either and I don't know anywhere that would.

## 2023-08-22 NOTE — Addendum Note (Signed)
Encounter addended by: Caffie Pinto on: 08/22/2023 8:53 AM  Actions taken: Imaging Exam ended

## 2023-08-23 NOTE — Telephone Encounter (Signed)
Please make patient aware

## 2023-09-04 DIAGNOSIS — N319 Neuromuscular dysfunction of bladder, unspecified: Secondary | ICD-10-CM | POA: Diagnosis not present

## 2023-09-04 DIAGNOSIS — R339 Retention of urine, unspecified: Secondary | ICD-10-CM | POA: Diagnosis not present

## 2023-09-12 DIAGNOSIS — R339 Retention of urine, unspecified: Secondary | ICD-10-CM | POA: Diagnosis not present

## 2023-09-26 DIAGNOSIS — N319 Neuromuscular dysfunction of bladder, unspecified: Secondary | ICD-10-CM | POA: Diagnosis not present

## 2023-10-08 DIAGNOSIS — I10 Essential (primary) hypertension: Secondary | ICD-10-CM | POA: Diagnosis not present

## 2023-10-08 DIAGNOSIS — Z299 Encounter for prophylactic measures, unspecified: Secondary | ICD-10-CM | POA: Diagnosis not present

## 2023-10-08 DIAGNOSIS — G35 Multiple sclerosis: Secondary | ICD-10-CM | POA: Diagnosis not present

## 2023-10-08 DIAGNOSIS — Z0000 Encounter for general adult medical examination without abnormal findings: Secondary | ICD-10-CM | POA: Diagnosis not present

## 2023-10-08 DIAGNOSIS — E559 Vitamin D deficiency, unspecified: Secondary | ICD-10-CM | POA: Diagnosis not present

## 2023-10-12 DIAGNOSIS — R339 Retention of urine, unspecified: Secondary | ICD-10-CM | POA: Diagnosis not present

## 2023-10-15 DIAGNOSIS — R300 Dysuria: Secondary | ICD-10-CM | POA: Diagnosis not present

## 2023-11-14 DIAGNOSIS — R339 Retention of urine, unspecified: Secondary | ICD-10-CM | POA: Diagnosis not present

## 2023-12-11 DIAGNOSIS — G35 Multiple sclerosis: Secondary | ICD-10-CM | POA: Diagnosis not present

## 2023-12-11 DIAGNOSIS — M25561 Pain in right knee: Secondary | ICD-10-CM | POA: Diagnosis not present

## 2023-12-14 DIAGNOSIS — R339 Retention of urine, unspecified: Secondary | ICD-10-CM | POA: Diagnosis not present

## 2024-01-03 DIAGNOSIS — M25561 Pain in right knee: Secondary | ICD-10-CM | POA: Diagnosis not present

## 2024-01-28 ENCOUNTER — Encounter: Payer: Self-pay | Admitting: Adult Health

## 2024-01-28 ENCOUNTER — Ambulatory Visit (INDEPENDENT_AMBULATORY_CARE_PROVIDER_SITE_OTHER): Payer: 59 | Admitting: Adult Health

## 2024-01-28 VITALS — BP 129/85 | HR 71 | Ht 68.0 in | Wt 250.4 lb

## 2024-01-28 DIAGNOSIS — G2581 Restless legs syndrome: Secondary | ICD-10-CM

## 2024-01-28 DIAGNOSIS — G35 Multiple sclerosis: Secondary | ICD-10-CM | POA: Diagnosis not present

## 2024-01-28 MED ORDER — ZEPOSIA 0.92 MG PO CAPS: 1.0000 | ORAL_CAPSULE | Freq: Every day | ORAL | 3 refills | Status: DC

## 2024-01-28 NOTE — Progress Notes (Signed)
 PATIENT: [name] DOB: 03-29-1981  REASON FOR VISIT: follow up HISTORY FROM: patient Primary neurologist: Dr. Vickey Huger  Chief Complaint  Patient presents with   Room 19    Pt is here Alone. Pt states that she has been doing good since her last appointment. Pt last fall was 1 month ago. Pt denies any new MS exacerbation. Pt is stable on her Zeposia.       HISTORY OF PRESENT ILLNESS: Today 01/28/24:  [name] is a 43 y.o. female with a history of MS. Returns today for follow-up. Remains on Zeposia. No new numbness or weakness. No change in gait or balance. No change in vision. Sleeps well.  Reports that fatigue waxes and wanes.  She did have a brain MRI at the last visit that did not show any new lesions.  We ordered a cervical MRI but they reported that they could not do because about bladder stimulator.  However the patient states that the bladder stimulator is MRI compatible.  She reports that her surgeon only implanted this one because it was MRI compatible.  She has actually had an MRI of the cervical spine since she had the bladder stimulator.  She states that she went back to see her urologist in February and the bladder stimulator was reprogrammed.  She states that she has not had to self cath since then.  I reviewed their notes in epic.  It does mention that stimulator is MRI compatible and Jake from the company was supposed to call imaging center to educate them on this.  06/18/23: [name] is a 43 y.o. female with a history of Multiple Sclerosis. Returns today for follow-up. She remains on Zeposia.   Reports that she is tolerating the medication well.  Denies any new numbness or weakness.  No changes in her gait or balance.no change with vision. No change with bowels or bladder. Does have a bladder stimulator. At the last viist with Dr. Vickey Huger- she repeated MRI that showed new lesions. However she was not taking Zeposia consistently. It was decided that she  would stay on zeposia for now. She complains of fatigue for the last two weeks. She saw her PCP in June and Vit d was low. She has been taking Vit D supplement. Levels have not been rechecked.    05/11/22: [name] is a 43 year old female with a history of multiple sclerosis.  She returns today for follow-up.  She remains on Zeposia.   Reports that she is tolerating the medication well.  Denies any new numbness or weakness.  No changes in her gait or balance.  Reports that her legs ache but better after she takes mirapex. Denies any changes with her vision.  She continues to be followed by urology for chronic urinary tract infections and difficulty emptying bladder.  Now has a bladder stimulator- in march. Only had to self cath 6 times since them.   11/10/21: [name] is a 43 year old female with a history of multiple sclerosis.  She returns today for follow-up.  She continues on Zeposia.  Reports that this continues to work well for her.  She states that she continues to have ongoing back pain that is managed by neurosurgery.  She did try a nerve block but it was not helpful.  Recently started on Flexeril and has found that beneficial.  She continues to see urology for chronic urinary tract infections and difficulty emptying her bladder.  She has to self cath up to  3-4 times a day.  They are thinking about putting in a bladder stimulator.  She reports that it will be compatible for MRI.  She denies any new numbness or tingling.  No changes with her gait or balance.  No changes with her vision.  Recently had blood work in November and reports that she had blood work this morning with her urologist.  05/05/21: [name] is a 43 year old female with a history of multiple sclerosis.  She returns today for follow-up.  She has been started on Zeposia she states that since she started this medication her back pain has tripled.  Reports that she is always had low back pain.  We had back surgery in 2006.  However she  has noticed that since she started this medication her pain has tripled.  She denies any new numbness or tingling.  No changes with her gait or balance.  No changes with the bowels or bladder-continues to self cath.  She returns today for an evaluation.  12/22/20: [name] is a 43 year old female with a history of multiple sclerosis.  She returns today to discuss MRI results.  MRI of the brain did show progression  And an active demyelination.  The patient has been on Plegridy.  She has noticed some weakness in her right leg and right hand.   MRI brain with and without contrast December 10, 2020:  IMPRESSION: New subcentimeter focus of enhancement in the right frontal periventricular white matter most consistent with active demyelination. Otherwise stable moderate burden of chronic demyelination. Stable parenchymal volume loss.  MRI cervical spine with and without contrast December 10, 2020:  IMPRESSION: Likely no substantial change in chronic demyelinating cord lesions. No definite new lesion or abnormal enhancement.    11/23/20: [name] is a 43 year old female with a history of multiple sclerosis and restless leg syndrome.  She returns today for follow-up.  She remains on Plegridy.patient reports that since 2024/09/03 she has noticed more weakness in the right leg.  She states that she has been under a lot of stress as her father passed away in September 03, 2024 and she has had to move.  No changes with the bowels or bladder-continues to self cath.  She plans to follow-up with her urologist to discuss any medication options.  Reports that she has had minimal changes in her gait mainly due to increased weakness in the right leg..  Denies any changes in her vision.  Remains on Mirapex for restless leg.  She returns today for an evaluation.    Imaging 09/22/2019:  MRI cervical spine with and without contrast: IMPRESSION: This MRI of the cervical spine with and without contrast shows the following: 1.    Multiple T2 hyperintense foci within the spinal cord as detailed above consistent with chronic demyelinating plaque associated with multiple sclerosis.  None of the foci enhanced.  Compared to the MRI dated 02/15/2017, there is no interval change. 2.   There is a normal enhancement pattern.  MRI brain with and without contrast:  IMPRESSION: This MRI of the brain with and without contrast shows the following: 1.   There are multiple T2/FLAIR hyperintense foci in the hemispheres, brainstem and left middle cerebellar peduncle in a pattern and configuration consistent with chronic demyelinating plaque associated with multiple sclerosis.  None of the foci appears to be acute and they do not enhance.  Compared to the MRI dated 02/15/2017, there is no interval change. 2.   There are no acute findings and there is a  normal enhancement pattern.  HISTORY 02/19/20: [name] is a 43 year old female with a history of multiple sclerosis and restless leg syndrome.  She returns today for follow-up.  She remains on Plegridy.  She did have a potential exacerbation in March.  She received IV Solu-Medrol with good benefit.  Reports that her symptoms have been stable since then.  She denies any new numbness or weakness.  Denies any changes with her gait or balance.  She continues to have the self cath.  Remains on Mirapex for restless leg.  She returns today for an evaluation.  HISTORY  08/20/19:   [name] is a 43 year old female with a history of multiple sclerosis and restless leg syndrome.  She returns today for follow-up.  She continues on Plegridy.  She continues to tolerate this medication well.  She denies any new numbness or weakness.  Denies any changes with her gait or balance.  She is now having to self cath up to 3 times a day.  She has not seen Dr. Ashley Royalty in 2 years.  She states that she keeps a urinary tract infection despite being on preventative medication.  No change in her vision.  Her last MRI of the  brain and cervical spine was in 2018.  She reports that Mirapex has been beneficial for restless leg syndrome.  She continues to take 0.25 mg at dinnertime and at bedtime.  She returns today for evaluation.  REVIEW OF SYSTEMS: Out of a complete 14 system review of symptoms, the patient complains only of the following symptoms, and all other reviewed systems are negative.  See HPI  ALLERGIES: Allergies  Allergen Reactions   Diclofenac Anaphylaxis   Zofran [Ondansetron Hcl] Other (See Comments)    Prolonged QT - unable to have zofran per report   Propoxyphene N-Acetaminophen Rash    HOME MEDICATIONS: Outpatient Medications Prior to Visit  Medication Sig Dispense Refill   ALPRAZolam (XANAX) 1 MG tablet TAKE ONE-HALF TABLET BY MOUTH TWICE A DAY AS NEEDED FOR ANXIETY 30 tablet 5   cyclobenzaprine (FLEXERIL) 10 MG tablet Take 10 mg by mouth 3 (three) times daily as needed for muscle spasms.     ergocalciferol (VITAMIN D2) 1.25 MG (50000 UT) capsule Take 50,000 Units by mouth once a week.     furosemide (LASIX) 20 MG tablet Take 20 mg by mouth daily.     potassium chloride (KLOR-CON) 10 MEQ tablet Take 10 mEq by mouth daily.     pramipexole (MIRAPEX) 0.25 MG tablet TAKE ONE TABLET (0.25MG  TOTAL) BY MOUTH THREE TIMES DAILY 270 tablet 3   rosuvastatin (CRESTOR) 10 MG tablet Take 1 tablet (10 mg total) by mouth daily. Pt needs to make appt with provider for further refills - 1st attempt 30 tablet 0   sertraline (ZOLOFT) 100 MG tablet Take 100 mg by mouth daily.     ZEPOSIA 0.92 MG CAPS TAKE 1 CAPSULE BY MOUTH DAILY 90 capsule 1   No facility-administered medications prior to visit.    PAST MEDICAL HISTORY: Past Medical History:  Diagnosis Date   Anxiety    Depression    Enlarged heart    Fibroid 04/24/2016   GERD (gastroesophageal reflux disease)    Leaky heart valve    Low vitamin D level    17.1   Menorrhagia with irregular cycle 04/13/2016   Multiple sclerosis (HCC)    Dr  Wynonia Hazard   Multiple sclerosis exacerbation (HCC)    Numbness in right leg 02/13,12/12   Obesity (  BMI 35.0-39.9 without comorbidity)    Other malaise and fatigue 10/20/2013   Ovarian cyst 04/24/2016   Regurgitation    cardiac valve. echo 2013 for first eval GILENYA,mod MR,TR    PAST SURGICAL HISTORY: Past Surgical History:  Procedure Laterality Date   BACK SURGERY  2006   CESAREAN SECTION  2000   CHOLECYSTECTOMY  2010   COLONOSCOPY  09/14/2010   Fields-hyperplastic polyps, int hemorrhoids   CYST EXCISION  2006   pilonoidal   DILITATION & CURRETTAGE/HYSTROSCOPY WITH NOVASURE ABLATION N/A 07/21/2016   Procedure: DILATATION & CURETTAGE/HYSTEROSCOPY WITH NOVASURE ABLATION;  Surgeon: Tilda Burrow, MD;  Location: [facility];  Service: Gynecology;  Laterality: N/A;  Novasure lenght - 5.0 width - 4.1 power - 113 time - 2 min 03 seconds   ESOPHAGOGASTRODUODENOSCOPY  08/05/2008   Fields-incomplete Schatzki's ring, distal esophageal erosions/esophagitis(mild), small HH, NEGATIVE h pylori   LAPAROSCOPIC APPENDECTOMY N/A 03/26/2018   Procedure: APPENDECTOMY LAPAROSCOPIC;  Surgeon: Franky Macho, MD;  Location: [facility];  Service: General;  Laterality: N/A;   LAPAROSCOPIC BILATERAL SALPINGECTOMY Bilateral 07/21/2016   Procedure: LAPAROSCOPIC RIGHT SALPINGECTOMY AND LEFT OOPHECTOMY;  Surgeon: Tilda Burrow, MD;  Location: [facility];  Service: Gynecology;  Laterality: Bilateral;   LUMBAR DISC SURGERY     US ECHOCARDIOGRAPHY  03/19/2012   RV mildly dilated,RA mod. dilated,strial septum aneurysmal,Mod. MR & TR    FAMILY HISTORY: Family History  Problem Relation Age of Onset   Other Mother        lost leg, has trouble swallowing   Thyroid nodules Mother    Heart attack Father    Coronary artery disease Father    Diabetes Father    Rheum arthritis Father    Colon cancer Paternal Uncle 56   Colon cancer Paternal Grandmother 84   Diabetes Paternal Grandmother    Cancer Paternal Grandmother         breast,lung   Cancer Maternal Grandmother 34       kind unknown   Diabetes Maternal Grandmother    Myasthenia gravis Maternal Grandfather    Other Sister        tumor on ovary   ADD / ADHD Son    Heart attack Paternal Grandfather    Multiple sclerosis Other        paternal great aunt   Breast cancer Neg Hx     SOCIAL HISTORY: Social History   Socioeconomic History   Marital status: Divorced    Spouse name: Divorced    Number of children: 1   Years of education: college   Highest education level: Not on file  Occupational History   Occupation: disabled  Tobacco Use   Smoking status: Former    Current packs/day: 0.00    Average packs/day: 1 pack/day for 20.0 years (20.0 ttl pk-yrs)    Types: Cigarettes    Start date: 02/15/1992    Quit date: 02/15/2012    Years since quitting: 11.9   Smokeless tobacco: Never  Vaping Use   Vaping status: Never Used  Substance and Sexual Activity   Alcohol use: No    Alcohol/week: 0.0 standard drinks of alcohol   Drug use: No   Sexual activity: Never    Birth control/protection: None  Other Topics Concern   Not on file  Social History Narrative   Patient is divorced and lives at home and he son lives with her.   Patient has one child.   Patient is disabled.   Patient has some college  education.   Patient is right-handed.   Patient drinks caffeine occasionally      Social Drivers of Corporate investment banker Strain: Low Risk  (05/10/2022)   Overall Financial Resource Strain (CARDIA)    Difficulty of Paying Living Expenses: Not hard at all  Food Insecurity: No Food Insecurity (05/10/2022)   Hunger Vital Sign    Worried About Running Out of Food in the Last Year: Never true    Ran Out of Food in the Last Year: Never true  Transportation Needs: No Transportation Needs (05/10/2022)   PRAPARE - Administrator, Civil Service (Medical): No    Lack of Transportation (Non-Medical): No  Physical Activity: Inactive  (05/10/2022)   Exercise Vital Sign    Days of Exercise per Week: 0 days    Minutes of Exercise per Session: 0 min  Stress: No Stress Concern Present (05/10/2022)   [employer] of Occupational Health - Occupational Stress Questionnaire    Feeling of Stress : Not at all  Social Connections: Moderately Integrated (05/10/2022)   Social Connection and Isolation Panel [NHANES]    Frequency of Communication with Friends and Family: More than three times a week    Frequency of Social Gatherings with Friends and Family: More than three times a week    Attends Religious Services: More than 4 times per year    Active Member of [employer] or Organizations: Yes    Attends Banker Meetings: More than 4 times per year    Marital Status: Divorced  Intimate Partner Violence: Not At Risk (05/10/2022)   Humiliation, Afraid, Rape, and Kick questionnaire    Fear of Current or Ex-Partner: No    Emotionally Abused: No    Physically Abused: No    Sexually Abused: No      PHYSICAL EXAM  Vitals:   01/28/24 1427  BP: 129/85  Pulse: 71  Weight: 250 lb 6.4 oz (113.6 kg)  Height: 5\' 8"  (1.727 m)     Body mass index is 38.07 kg/m.  Generalized: Well developed, in no acute distress   Neurological examination  Mentation: Alert oriented to time, place, history taking. Follows all commands speech and language fluent Cranial nerve II-XII: . Extraocular movements were full, visual field were full on confrontational test. . Head turning and shoulder shrug  were normal and symmetric. Motor: The motor testing reveals 5 over 5 strength of all 4 extremities with the exception of slightly weaker in the right lower extremity.  Good symmetric motor tone is noted throughout.  Sensory: Sensory testing is intact to soft touch on all 4 extremities. No evidence of extinction is noted.  Coordination: Cerebellar testing reveals good finger-nose-finger and heel-to-shin bilaterally.  Gait and station: Patient has  a slight limp when ambulating. Reflexes: Deep tendon reflexes are symmetric and normal bilaterally.   DIAGNOSTIC DATA (LABS, IMAGING, TESTING) - I reviewed patient records, labs, notes, testing and imaging myself where available.  Lab Results  Component Value Date   WBC 8.7 06/18/2023   HGB 14.1 06/18/2023   HCT 41.2 06/18/2023   MCV 83 06/18/2023   PLT 360 06/18/2023      Component Value Date/Time   NA 144 06/18/2023 1421   K 3.8 06/18/2023 1421   K 4.2 02/28/2012 1113   CL 105 06/18/2023 1421   CL 107 02/28/2012 1113   CO2 25 06/18/2023 1421   CO2 23 02/28/2012 1113   GLUCOSE 85 06/18/2023 1421   GLUCOSE 92 08/27/2021  1508   BUN 10 06/18/2023 1421   CREATININE 0.75 06/18/2023 1421   CREATININE 0.73 02/28/2012 1113   CALCIUM 9.2 06/18/2023 1421   CALCIUM 9.5 02/28/2012 1113   PROT 6.4 06/18/2023 1421   PROT 6.4 02/28/2012 1113   ALBUMIN 4.3 06/18/2023 1421   AST 17 06/18/2023 1421   AST 14 02/28/2012 1113   ALT 18 06/18/2023 1421   ALKPHOS 55 06/18/2023 1421   ALKPHOS 49 02/28/2012 1113   BILITOT 0.4 06/18/2023 1421   GFRNONAA >60 08/27/2021 1508   GFRAA 122 11/23/2020 1512   Lab Results  Component Value Date   CHOL 166 11/03/2021   HDL 51 11/03/2021   LDLCALC 95 11/03/2021   TRIG 108 11/03/2021   CHOLHDL 3.3 11/03/2021    Lab Results  Component Value Date   TSH 3.700 12/09/2014      ASSESSMENT AND PLAN 43 y.o. year old female  has a past medical history of Anxiety, Depression, Enlarged heart, Fibroid (04/24/2016), GERD (gastroesophageal reflux disease), Leaky heart valve, Low vitamin D level, Menorrhagia with irregular cycle (04/13/2016), Multiple sclerosis (HCC), Multiple sclerosis exacerbation (HCC), Numbness in right leg (02/13,12/12), Obesity (BMI 35.0-39.9 without comorbidity), Other malaise and fatigue (10/20/2013), Ovarian cyst (04/24/2016), and Regurgitation. here with:  1.  Multiple sclerosis  - Continue Zeposia - blood work today CBC, CMP -  At her next visit with Dr. Vickey Huger she can assess whether MRI of the cervical spine needs to be repeated at some point  2.  RLS  - Continue mirapex to 0.25 TID  -Follow-up in 6 months months or sooner if needed  Advised if symptoms worsen or she develops new symptoms she should let us know follow-up in 6 months or sooner if needed   Butch Penny, MSN, NP-C 01/28/2024, 3:07 PM [facility] [address] [place], [place] [address] [phone]

## 2024-01-29 ENCOUNTER — Encounter: Payer: Self-pay | Admitting: Adult Health

## 2024-01-29 DIAGNOSIS — R899 Unspecified abnormal finding in specimens from other organs, systems and tissues: Secondary | ICD-10-CM

## 2024-01-29 LAB — COMPREHENSIVE METABOLIC PANEL WITH GFR
ALT: 12 IU/L (ref 0–32)
AST: 13 IU/L (ref 0–40)
Albumin: 4.4 g/dL (ref 3.9–4.9)
Alkaline Phosphatase: 62 IU/L (ref 44–121)
BUN/Creatinine Ratio: 13 (ref 9–23)
BUN: 9 mg/dL (ref 6–24)
Bilirubin Total: 0.2 mg/dL (ref 0.0–1.2)
CO2: 24 mmol/L (ref 20–29)
Calcium: 9.8 mg/dL (ref 8.7–10.2)
Chloride: 105 mmol/L (ref 96–106)
Creatinine, Ser: 0.7 mg/dL (ref 0.57–1.00)
Globulin, Total: 1.9 g/dL (ref 1.5–4.5)
Glucose: 90 mg/dL (ref 70–99)
Potassium: 5 mmol/L (ref 3.5–5.2)
Sodium: 142 mmol/L (ref 134–144)
Total Protein: 6.3 g/dL (ref 6.0–8.5)
eGFR: 109 mL/min/{1.73_m2} (ref >59)

## 2024-01-29 LAB — CBC WITH DIFFERENTIAL/PLATELET
Basophils Absolute: 0.1 10*3/uL (ref 0.0–0.2)
Basos: 1 %
EOS (ABSOLUTE): 0.4 10*3/uL (ref 0.0–0.4)
Eos: 3 %
Hematocrit: 41.1 % (ref 34.0–46.6)
Hemoglobin: 13.9 g/dL (ref 11.1–15.9)
Immature Grans (Abs): 0 10*3/uL (ref 0.0–0.1)
Immature Granulocytes: 0 %
Lymphocytes Absolute: 1.9 10*3/uL (ref 0.7–3.1)
Lymphs: 16 %
MCH: 28.7 pg (ref 26.6–33.0)
MCHC: 33.8 g/dL (ref 31.5–35.7)
MCV: 85 fL (ref 79–97)
Monocytes Absolute: 0.5 10*3/uL (ref 0.1–0.9)
Monocytes: 4 %
Neutrophils Absolute: 8.9 10*3/uL — ABNORMAL HIGH (ref 1.4–7.0)
Neutrophils: 76 %
Platelets: 382 10*3/uL (ref 150–450)
RBC: 4.84 x10E6/uL (ref 3.77–5.28)
RDW: 11.9 % (ref 11.7–15.4)
WBC: 11.7 10*3/uL — ABNORMAL HIGH (ref 3.4–10.8)

## 2024-01-29 NOTE — Telephone Encounter (Signed)
 I called the patient and let her know that Aundra Millet is unable to provide a letter for the apartment and why. Patient verbalized understanding. She did ask if Aundra Millet had seen/commented on her lab results yet as she sees WBCs are elevated. Patient denies any recent or active infections or signs of infection that she knows of.   From Megan:  Patient asked during the office visit if I would write her a letter to state that she needs a 2 bedroom apartment for medical supplies.  I discussed with my colleagues.  I am not actively providing her with any medical supplies nor does she have any that I am aware of.  Therefore I cannot write a letter stating that.  Can you call and let her know.

## 2024-01-29 NOTE — Telephone Encounter (Signed)
 Yes WBC and neutrophils slightly elevated. We will recheck in 2 weeks

## 2024-01-30 NOTE — Addendum Note (Signed)
 Addended by: Bertram Savin on: 01/30/2024 07:58 AM   Modules accepted: Orders

## 2024-02-05 ENCOUNTER — Other Ambulatory Visit: Payer: Self-pay | Admitting: Neurology

## 2024-02-05 NOTE — Telephone Encounter (Signed)
 Last seen on 01/28/24 Follow up scheduled on 08/04/24   Dispensed Days Supply Quantity Provider Pharmacy  ALPRAZOLAM  1 MG TABS 12/11/2023 30 30 tablet Sater, Sherida Dimmer, MD [facility] - ...     Rx pending to be signed

## 2024-02-11 ENCOUNTER — Other Ambulatory Visit: Payer: Self-pay

## 2024-02-11 ENCOUNTER — Other Ambulatory Visit (INDEPENDENT_AMBULATORY_CARE_PROVIDER_SITE_OTHER): Payer: Self-pay

## 2024-02-11 DIAGNOSIS — R899 Unspecified abnormal finding in specimens from other organs, systems and tissues: Secondary | ICD-10-CM

## 2024-02-11 DIAGNOSIS — Z0289 Encounter for other administrative examinations: Secondary | ICD-10-CM

## 2024-02-12 LAB — CBC WITH DIFFERENTIAL/PLATELET
Basophils Absolute: 0.1 10*3/uL (ref 0.0–0.2)
Basos: 1 %
EOS (ABSOLUTE): 0.4 10*3/uL (ref 0.0–0.4)
Eos: 4 %
Hematocrit: 41.9 % (ref 34.0–46.6)
Hemoglobin: 14 g/dL (ref 11.1–15.9)
Immature Grans (Abs): 0 10*3/uL (ref 0.0–0.1)
Immature Granulocytes: 0 %
Lymphocytes Absolute: 1.7 10*3/uL (ref 0.7–3.1)
Lymphs: 17 %
MCH: 28.7 pg (ref 26.6–33.0)
MCHC: 33.4 g/dL (ref 31.5–35.7)
MCV: 86 fL (ref 79–97)
Monocytes Absolute: 0.5 10*3/uL (ref 0.1–0.9)
Monocytes: 5 %
Neutrophils Absolute: 7 10*3/uL (ref 1.4–7.0)
Neutrophils: 73 %
Platelets: 355 10*3/uL (ref 150–450)
RBC: 4.87 x10E6/uL (ref 3.77–5.28)
RDW: 12.5 % (ref 11.7–15.4)
WBC: 9.7 10*3/uL (ref 3.4–10.8)

## 2024-02-16 DIAGNOSIS — M25511 Pain in right shoulder: Secondary | ICD-10-CM | POA: Diagnosis not present

## 2024-02-16 DIAGNOSIS — S43401A Unspecified sprain of right shoulder joint, initial encounter: Secondary | ICD-10-CM | POA: Diagnosis not present

## 2024-03-05 ENCOUNTER — Encounter: Payer: Self-pay | Admitting: *Deleted

## 2024-04-11 DIAGNOSIS — E7800 Pure hypercholesterolemia, unspecified: Secondary | ICD-10-CM | POA: Diagnosis not present

## 2024-04-11 DIAGNOSIS — Z79899 Other long term (current) drug therapy: Secondary | ICD-10-CM | POA: Diagnosis not present

## 2024-04-11 DIAGNOSIS — R5383 Other fatigue: Secondary | ICD-10-CM | POA: Diagnosis not present

## 2024-04-11 DIAGNOSIS — Z7189 Other specified counseling: Secondary | ICD-10-CM | POA: Diagnosis not present

## 2024-04-11 DIAGNOSIS — Z299 Encounter for prophylactic measures, unspecified: Secondary | ICD-10-CM | POA: Diagnosis not present

## 2024-04-11 DIAGNOSIS — Z0000 Encounter for general adult medical examination without abnormal findings: Secondary | ICD-10-CM | POA: Diagnosis not present

## 2024-04-11 DIAGNOSIS — I10 Essential (primary) hypertension: Secondary | ICD-10-CM | POA: Diagnosis not present

## 2024-04-16 DIAGNOSIS — M7591 Shoulder lesion, unspecified, right shoulder: Secondary | ICD-10-CM | POA: Diagnosis not present

## 2024-04-16 DIAGNOSIS — R2231 Localized swelling, mass and lump, right upper limb: Secondary | ICD-10-CM | POA: Diagnosis not present

## 2024-06-16 DIAGNOSIS — G35 Multiple sclerosis: Secondary | ICD-10-CM | POA: Diagnosis not present

## 2024-06-19 DIAGNOSIS — G35 Multiple sclerosis: Secondary | ICD-10-CM | POA: Diagnosis not present

## 2024-06-19 DIAGNOSIS — M25811 Other specified joint disorders, right shoulder: Secondary | ICD-10-CM | POA: Diagnosis not present

## 2024-06-19 DIAGNOSIS — Z299 Encounter for prophylactic measures, unspecified: Secondary | ICD-10-CM | POA: Diagnosis not present

## 2024-06-26 ENCOUNTER — Other Ambulatory Visit: Payer: Self-pay | Admitting: Internal Medicine

## 2024-06-26 DIAGNOSIS — Z1231 Encounter for screening mammogram for malignant neoplasm of breast: Secondary | ICD-10-CM

## 2024-07-02 DIAGNOSIS — M6289 Other specified disorders of muscle: Secondary | ICD-10-CM | POA: Diagnosis not present

## 2024-07-02 DIAGNOSIS — M25511 Pain in right shoulder: Secondary | ICD-10-CM | POA: Diagnosis not present

## 2024-07-11 DIAGNOSIS — Z299 Encounter for prophylactic measures, unspecified: Secondary | ICD-10-CM | POA: Diagnosis not present

## 2024-07-11 DIAGNOSIS — G35 Multiple sclerosis: Secondary | ICD-10-CM | POA: Diagnosis not present

## 2024-07-11 DIAGNOSIS — R52 Pain, unspecified: Secondary | ICD-10-CM | POA: Diagnosis not present

## 2024-07-11 DIAGNOSIS — I10 Essential (primary) hypertension: Secondary | ICD-10-CM | POA: Diagnosis not present

## 2024-07-15 ENCOUNTER — Ambulatory Visit
Admission: RE | Admit: 2024-07-15 | Discharge: 2024-07-15 | Disposition: A | Discharge status: Home / Self Care | Source: Ambulatory Visit | Attending: Internal Medicine | Admitting: Internal Medicine

## 2024-07-15 DIAGNOSIS — Z1231 Encounter for screening mammogram for malignant neoplasm of breast: Secondary | ICD-10-CM | POA: Diagnosis not present

## 2024-07-30 ENCOUNTER — Other Ambulatory Visit: Payer: Self-pay | Admitting: *Deleted

## 2024-07-30 DIAGNOSIS — R2230 Localized swelling, mass and lump, unspecified upper limb: Secondary | ICD-10-CM

## 2024-07-31 ENCOUNTER — Encounter: Payer: Self-pay | Admitting: General Surgery

## 2024-07-31 ENCOUNTER — Ambulatory Visit: Admitting: General Surgery

## 2024-07-31 VITALS — BP 124/86 | HR 68 | Temp 97.5°F | Resp 14 | Ht 68.0 in | Wt 262.0 lb

## 2024-07-31 DIAGNOSIS — R2230 Localized swelling, mass and lump, unspecified upper limb: Secondary | ICD-10-CM | POA: Diagnosis not present

## 2024-08-01 NOTE — Progress Notes (Signed)
 [name]; [id number]; 1980/11/25   HPI Patient is a 43 year old white female who was referred to my care by Dr. Maree for evaluation and treatment of a lipoma over the right shoulder.  Patient states that his been present for some time and is causing an increased pain with right shoulder movement.  She thinks it may have grown in size. Past Medical History:  Diagnosis Date   Anxiety    Depression    Enlarged heart    Fibroid 04/24/2016   GERD (gastroesophageal reflux disease)    Leaky heart valve    Low vitamin D  level    17.1   Menorrhagia with irregular cycle 04/13/2016   Multiple sclerosis    Dr Rodman   Multiple sclerosis exacerbation    Numbness in right leg 02/13,12/12   Obesity (BMI 35.0-39.9 without comorbidity)    Other malaise and fatigue 10/20/2013   Ovarian cyst 04/24/2016   Regurgitation    cardiac valve. echo 2013 for first eval GILENYA,mod MR,TR    Past Surgical History:  Procedure Laterality Date   BACK SURGERY  2006   CESAREAN SECTION  2000   CHOLECYSTECTOMY  2010   COLONOSCOPY  09/14/2010   Fields-hyperplastic polyps, int hemorrhoids   CYST EXCISION  2006   pilonoidal   DILITATION & CURRETTAGE/HYSTROSCOPY WITH NOVASURE ABLATION N/A 07/21/2016   Procedure: DILATATION & CURETTAGE/HYSTEROSCOPY WITH NOVASURE ABLATION;  Surgeon: Norleen LULLA Server, MD;  Location: [facility];  Service: Gynecology;  Laterality: N/A;  Novasure lenght - 5.0 width - 4.1 power - 113 time - 2 min 03 seconds   ESOPHAGOGASTRODUODENOSCOPY  08/05/2008   Fields-incomplete Schatzki's ring, distal esophageal erosions/esophagitis(mild), small HH, NEGATIVE h pylori   LAPAROSCOPIC APPENDECTOMY N/A 03/26/2018   Procedure: APPENDECTOMY LAPAROSCOPIC;  Surgeon: Mavis Anes, MD;  Location: [facility];  Service: General;  Laterality: N/A;   LAPAROSCOPIC BILATERAL SALPINGECTOMY Bilateral 07/21/2016   Procedure: LAPAROSCOPIC RIGHT SALPINGECTOMY AND LEFT OOPHECTOMY;  Surgeon: Norleen LULLA Server, MD;   Location: [facility];  Service: Gynecology;  Laterality: Bilateral;   LUMBAR DISC SURGERY     US  ECHOCARDIOGRAPHY  03/19/2012   RV mildly dilated,RA mod. dilated,strial septum aneurysmal,Mod. MR & TR    Family History  Problem Relation Age of Onset   Other Mother        lost leg, has trouble swallowing   Thyroid  nodules Mother    Heart attack Father    Coronary artery disease Father    Diabetes Father    Rheum arthritis Father    Colon cancer Paternal Uncle 55   Colon cancer Paternal Grandmother 83   Diabetes Paternal Grandmother    Cancer Paternal Grandmother        breast,lung   Cancer Maternal Grandmother 21       kind unknown   Diabetes Maternal Grandmother    Myasthenia gravis Maternal Grandfather    Other Sister        tumor on ovary   ADD / ADHD Son    Heart attack Paternal Grandfather    Multiple sclerosis Other        paternal great aunt   Breast cancer Neg Hx     Current Outpatient Medications on File Prior to Visit  Medication Sig Dispense Refill   ALPRAZolam  (XANAX ) 1 MG tablet TAKE ONE-HALF TABLET BY MOUTH TWICE A DAY AS NEEDED FOR ANXIETY 30 tablet 5   cyclobenzaprine (FLEXERIL) 10 MG tablet Take 10 mg by mouth 3 (three) times daily as needed for muscle spasms.  furosemide (LASIX) 20 MG tablet Take 20 mg by mouth daily.     potassium chloride  (KLOR-CON ) 10 MEQ tablet Take 10 mEq by mouth daily.     pramipexole  (MIRAPEX ) 0.25 MG tablet TAKE ONE TABLET (0.25MG  TOTAL) BY MOUTH THREE TIMES DAILY 270 tablet 3   sertraline  (ZOLOFT ) 100 MG tablet Take 100 mg by mouth daily.     ZEPOSIA  0.92 MG CAPS Take 1 capsule (0.92 mg total) by mouth daily. 90 capsule 3   No current facility-administered medications on file prior to visit.    Allergies  Allergen Reactions   Diclofenac Anaphylaxis   Zofran  [Ondansetron  Hcl] Other (See Comments)    Prolonged QT - unable to have zofran  per report   Propoxyphene N-Acetaminophen  Rash    Social History   Substance and Sexual  Activity  Alcohol  Use No   Alcohol /week: 0.0 standard drinks of alcohol     Social History   Tobacco Use  Smoking Status Former   Current packs/day: 0.00   Average packs/day: 1 pack/day for 20.0 years (20.0 ttl pk-yrs)   Types: Cigarettes   Start date: 02/15/1992   Quit date: 02/15/2012   Years since quitting: 12.4  Smokeless Tobacco Never    Review of Systems  Constitutional: Negative.   HENT: Negative.    Eyes: Negative.   Respiratory: Negative.    Cardiovascular: Negative.   Gastrointestinal: Negative.   Genitourinary: Negative.   Musculoskeletal:  Positive for joint pain.  Skin: Negative.   Neurological: Negative.   Endo/Heme/Allergies: Negative.   Psychiatric/Behavioral: Negative.      Objective   Vitals:   07/31/24 1118  BP: 124/86  Pulse: 68  Resp: 14  Temp: (!) 97.5 F (36.4 C)  SpO2: 98%    Physical Exam Vitals reviewed.  Constitutional:      Appearance: Normal appearance. She is not ill-appearing.  HENT:     Head: Normocephalic and atraumatic.  Cardiovascular:     Rate and Rhythm: Normal rate and regular rhythm.     Heart sounds: Normal heart sounds. No murmur heard.    No friction rub. No gallop.  Pulmonary:     Effort: Pulmonary effort is normal. No respiratory distress.     Breath sounds: Normal breath sounds. No stridor. No wheezing, rhonchi or rales.  Musculoskeletal:        General: Swelling present.     Comments: There is an indistinct soft tissue swelling in the subcutaneous tissue deep over the superior aspect of the right shoulder.  It was not mobile.  Patient does have decreased range of motion of the right shoulder secondary to pain.  Skin:    General: Skin is warm and dry.  Neurological:     Mental Status: She is alert and oriented to person, place, and time.    MRI shows a 4.9 x 4.4 x 8.8 lipoma arising within the supraspinatus dorsal superior fascia. Assessment  Lipoma, right shoulder, intramuscular Plan  I told the patient  that I have made a referral to Dr. Margrette of orthopedic surgery given its intimate association with the musculature.  She understands and agrees.  Her appointment with him is on on October 23 at 2:45 PM.

## 2024-08-04 ENCOUNTER — Ambulatory Visit: Admitting: Neurology

## 2024-08-04 ENCOUNTER — Encounter: Payer: Self-pay | Admitting: Neurology

## 2024-08-05 ENCOUNTER — Encounter: Payer: Self-pay | Admitting: Adult Health

## 2024-08-05 ENCOUNTER — Telehealth: Payer: Self-pay | Admitting: Adult Health

## 2024-08-05 ENCOUNTER — Encounter: Payer: Self-pay | Admitting: Neurology

## 2024-08-05 NOTE — Telephone Encounter (Signed)
Answered pt in other my chart message.

## 2024-08-05 NOTE — Telephone Encounter (Signed)
 Patient called to reschedule no show appointment

## 2024-08-06 ENCOUNTER — Ambulatory Visit (INDEPENDENT_AMBULATORY_CARE_PROVIDER_SITE_OTHER): Admitting: Neurology

## 2024-08-06 ENCOUNTER — Encounter: Payer: Self-pay | Admitting: Neurology

## 2024-08-06 VITALS — BP 128/80 | HR 56 | Ht 68.0 in | Wt 264.0 lb

## 2024-08-06 DIAGNOSIS — R208 Other disturbances of skin sensation: Secondary | ICD-10-CM | POA: Diagnosis not present

## 2024-08-06 DIAGNOSIS — E559 Vitamin D deficiency, unspecified: Secondary | ICD-10-CM

## 2024-08-06 DIAGNOSIS — N319 Neuromuscular dysfunction of bladder, unspecified: Secondary | ICD-10-CM

## 2024-08-06 DIAGNOSIS — G35A Relapsing-remitting multiple sclerosis: Secondary | ICD-10-CM | POA: Diagnosis not present

## 2024-08-06 DIAGNOSIS — R5383 Other fatigue: Secondary | ICD-10-CM | POA: Diagnosis not present

## 2024-08-06 DIAGNOSIS — G2581 Restless legs syndrome: Secondary | ICD-10-CM | POA: Diagnosis not present

## 2024-08-06 MED ORDER — SERTRALINE HCL 100 MG PO TABS: 100.0000 mg | ORAL_TABLET | Freq: Every day | ORAL | 3 refills | Status: AC

## 2024-08-06 MED ORDER — MAGNESIUM GLUCONATE 550 MG PO TABS: ORAL_TABLET | ORAL | 0 refills | Status: AC

## 2024-08-06 MED ORDER — ZEPOSIA 0.92 MG PO CAPS: 1.0000 | ORAL_CAPSULE | Freq: Every day | ORAL | 3 refills | Status: AC

## 2024-08-06 MED ORDER — ALPRAZOLAM 1 MG PO TABS: ORAL_TABLET | ORAL | 5 refills | Status: AC

## 2024-08-06 MED ORDER — PRAMIPEXOLE DIHYDROCHLORIDE 0.25 MG PO TABS: 0.2500 mg | ORAL_TABLET | Freq: Two times a day (BID) | ORAL | 3 refills | Status: AC

## 2024-08-06 NOTE — Patient Instructions (Signed)
 43 y.o. year old female  here with:     1) secondary progressive MS with relapses.  Has been suffering from RLS and anxiety, taking zoloft  and Zeposia .  We added magnesium  , PCP has added Vit D and refilled mirapex  bid.  Last MRI brain was last October, we will plan for next October 26 to repeat.    2) obesity affecting  joints, ability to exercise. Not diabetic , no apnea according to HST.    3) neurogenic bladder,  no longer self cath.  Device implanted by urology ( AXONICS )     Ordered labs , CBC diff, CMET and Mag Vit D level.    RV  every 6 months virtual alternating with face to face visits, NP and MD      I would like to thank  Maree Isles, Md [address] [place],  [place] [address] for allowing me to meet with this pleasant patient.

## 2024-08-06 NOTE — Progress Notes (Signed)
 Provider:  Dedra Gores, MD  Primary Care Physician:  Maree Isles, MD [address] [place] [address]     Referring Provider: Maree Isles, Md [address] [place],  [place] [address]          Chief Complaint according to patient   Patient presents with:                HISTORY OF PRESENT ILLNESS:  [name] is a 43 y.o. female patient who is here for revisit 08/06/2024 for  MS, RLS, affecting sleep. Mirapex  helps.  No DM but obesity, Vit D  deficiency supplemented by 50 K prescription.    Chief concern according to patient :   Refills of Pramipexol, refills of MS meds.  Consider Zoloft  for anxiety to not use alprazolam  as often.  Osteoarthritis and obesity related knee pain.  Zeposia , monthly refills needed- can we change to 3 months supply.     Fam Hx : see previous note , Grandma  is 53, grandfather had M.Gravis.   Social HX; see previous note  _: She moved May 2025 to a new apartment, her son is married and has no children.    CD 11-29-2022- [name], a 43  year-old patient with secondary progressive MS with some relapses presents today for a RV. Zeposia  is her medication of choice, she started that after having fresh lesions on MRI while on PLEGREDY in 2021. SABRA   Her son is now 66 and got married 2 years ago.  She now has a bladder stimulator that almost eliminated the need for self catheterization.  Leg aches and back aches have been better on Flexaril. Remains on Mirapex  for restless leg.   She reports new onset tingling on the left sie of the body, and usually she had this dyseasthesias on the right.  She reports nocturnal burning sensation in the e right leg and foot.  She used to get Solumdrol IV for the exacerbation. Had a MRI last year at this time, no new lesions. Cervical spine MRI was also not showing new lesions, but clinically there have been changes since last year.      [name] is a 43 year old female with a history of multiple  sclerosis.  She remains on Zeposia .   Reports that she is tolerating the medication well.  Denies any new numbness or weakness.  No changes in her gait or balance.  Reports that her legs ache but better after she takes mirapex . Denies any changes with her vision.  She continues to be followed by urology for chronic urinary tract infections and difficulty emptying bladder.  Now has a bladder stimulator- in march. Only had to self cath 6 times since them.    11/10/21: [name] is a 43 year old female with a history of multiple sclerosis.  She returns today for follow-up.  She continues on Zeposia .  Reports that this continues to work well for her.  She states that she continues to have ongoing back pain that is managed by neurosurgery.  She did try a nerve block but it was not helpful.  Recently started on Flexeril and has found that beneficial.  She continues to see urology for chronic urinary tract infections and difficulty emptying her bladder.  She has to self cath up to 3-4 times a day.  They are thinking about putting in a bladder stimulator.  She reports that it will be compatible for MRI.  She denies any new numbness or tingling.  No changes with her gait or balance.  No changes with her vision.  Recently had blood work in November and reports that she had blood work this morning with her urologist.   05/05/21: [name] is a 43 year old female with a history of multiple sclerosis.  She returns today for follow-up.  She has been started on Zeposia  she states that since she started this medication her back pain has tripled.  Reports that she is always had low back pain.  We had back surgery in 2006.  However she has noticed that since she started this medication her pain has tripled.  She denies any new numbness or tingling.  No changes with her gait or balance.  No changes with the bowels or bladder-continues to self cath.  She returns today for an evaluation.   12/22/20: [name] is a 43 year old  female with a history of multiple sclerosis.  She returns today to discuss MRI results.  MRI of the brain did show progression  And an active demyelination.  The patient has been on Plegridy .  She has noticed some weakness in her right leg and right hand.   MS patient  02/13/19: more self cath needed, more RLS , feeling in pain, cath every day 6 times or more.     Review of Systems: Out of a complete 14 system review, the patient complains of only the following symptoms, and all other reviewed systems are negative.:   Every day RLS, onset at bedtime, 10 PM.     Social History   Socioeconomic History   Marital status: Divorced    Spouse name: Divorced    Number of children: 1   Years of education: college   Highest education level: Not on file  Occupational History   Occupation: disabled  Tobacco Use   Smoking status: Former    Current packs/day: 0.00    Average packs/day: 1 pack/day for 20.0 years (20.0 ttl pk-yrs)    Types: Cigarettes    Start date: 02/15/1992    Quit date: 02/15/2012    Years since quitting: 12.4   Smokeless tobacco: Never  Vaping Use   Vaping status: Never Used  Substance and Sexual Activity   Alcohol  use: No    Alcohol /week: 0.0 standard drinks of alcohol    Drug use: No   Sexual activity: Never    Birth control/protection: None  Other Topics Concern   Not on file  Social History Narrative   Patient is divorced and lives at home and he son lives with her.   Patient has one child.   Patient is disabled.   Patient has some college education.   Patient is right-handed.   Patient drinks caffeine occasionally      Social Drivers of Corporate investment banker Strain: Low Risk  (05/10/2022)   Overall Financial Resource Strain (CARDIA)    Difficulty of Paying Living Expenses: Not hard at all  Food Insecurity: No Food Insecurity (05/10/2022)   Hunger Vital Sign    Worried About Running Out of Food in the Last Year: Never true    Ran Out of Food in the  Last Year: Never true  Transportation Needs: No Transportation Needs (05/10/2022)   PRAPARE - Administrator, Civil Service (Medical): No    Lack of Transportation (Non-Medical): No  Physical Activity: Inactive (05/10/2022)   Exercise Vital Sign    Days of Exercise per Week: 0 days  Minutes of Exercise per Session: 0 min  Stress: No Stress Concern Present (05/10/2022)   [employer] of Occupational Health - Occupational Stress Questionnaire    Feeling of Stress : Not at all  Social Connections: Moderately Integrated (05/10/2022)   Social Connection and Isolation Panel    Frequency of Communication with Friends and Family: More than three times a week    Frequency of Social Gatherings with Friends and Family: More than three times a week    Attends Religious Services: More than 4 times per year    Active Member of Clubs or Organizations: Yes    Attends Engineer, structural: More than 4 times per year    Marital Status: Divorced    Family History  Problem Relation Age of Onset   Other Mother        lost leg, has trouble swallowing   Thyroid  nodules Mother    Heart attack Father    Coronary artery disease Father    Diabetes Father    Rheum arthritis Father    Colon cancer Paternal Uncle 21   Colon cancer Paternal Grandmother 41   Diabetes Paternal Grandmother    Cancer Paternal Grandmother        breast,lung   Cancer Maternal Grandmother 3       kind unknown   Diabetes Maternal Grandmother    Myasthenia gravis Maternal Grandfather    Other Sister        tumor on ovary   ADD / ADHD Son    Heart attack Paternal Grandfather    Multiple sclerosis Other        paternal great aunt   Breast cancer Neg Hx     Past Medical History:  Diagnosis Date   Anxiety    Depression    Enlarged heart    Fibroid 04/24/2016   GERD (gastroesophageal reflux disease)    Leaky heart valve    Low vitamin D  level    17.1   Menorrhagia with irregular cycle 04/13/2016    Multiple sclerosis    Dr Rodman   Multiple sclerosis exacerbation    Numbness in right leg 02/13,12/12   Obesity (BMI 35.0-39.9 without comorbidity)    Other malaise and fatigue 10/20/2013   Ovarian cyst 04/24/2016   Regurgitation    cardiac valve. echo 2013 for first eval GILENYA,mod MR,TR    Past Surgical History:  Procedure Laterality Date   BACK SURGERY  2006   CESAREAN SECTION  2000   CHOLECYSTECTOMY  2010   COLONOSCOPY  09/14/2010   Fields-hyperplastic polyps, int hemorrhoids   CYST EXCISION  2006   pilonoidal   DILITATION & CURRETTAGE/HYSTROSCOPY WITH NOVASURE ABLATION N/A 07/21/2016   Procedure: DILATATION & CURETTAGE/HYSTEROSCOPY WITH NOVASURE ABLATION;  Surgeon: Norleen LULLA Server, MD;  Location: [facility];  Service: Gynecology;  Laterality: N/A;  Novasure lenght - 5.0 width - 4.1 power - 113 time - 2 min 03 seconds   ESOPHAGOGASTRODUODENOSCOPY  08/05/2008   Fields-incomplete Schatzki's ring, distal esophageal erosions/esophagitis(mild), small HH, NEGATIVE h pylori   LAPAROSCOPIC APPENDECTOMY N/A 03/26/2018   Procedure: APPENDECTOMY LAPAROSCOPIC;  Surgeon: Mavis Anes, MD;  Location: [facility];  Service: General;  Laterality: N/A;   LAPAROSCOPIC BILATERAL SALPINGECTOMY Bilateral 07/21/2016   Procedure: LAPAROSCOPIC RIGHT SALPINGECTOMY AND LEFT OOPHECTOMY;  Surgeon: Norleen LULLA Server, MD;  Location: [facility];  Service: Gynecology;  Laterality: Bilateral;   LUMBAR DISC SURGERY     US  ECHOCARDIOGRAPHY  03/19/2012   RV mildly dilated,RA mod. dilated,strial septum  aneurysmal,Mod. MR & TR     Current Outpatient Medications on File Prior to Visit  Medication Sig Dispense Refill   ALPRAZolam  (XANAX ) 1 MG tablet TAKE ONE-HALF TABLET BY MOUTH TWICE A DAY AS NEEDED FOR ANXIETY 30 tablet 5   cyclobenzaprine (FLEXERIL) 10 MG tablet Take 10 mg by mouth 3 (three) times daily as needed for muscle spasms.     furosemide (LASIX) 20 MG tablet Take 20 mg by mouth daily.     potassium  chloride (KLOR-CON ) 10 MEQ tablet Take 10 mEq by mouth daily.     pramipexole  (MIRAPEX ) 0.25 MG tablet TAKE ONE TABLET (0.25MG  TOTAL) BY MOUTH THREE TIMES DAILY 270 tablet 3   sertraline  (ZOLOFT ) 100 MG tablet Take 100 mg by mouth daily.     ZEPOSIA  0.92 MG CAPS Take 1 capsule (0.92 mg total) by mouth daily. 90 capsule 3   No current facility-administered medications on file prior to visit.    Allergies  Allergen Reactions   Diclofenac Anaphylaxis   Zofran  [Ondansetron  Hcl] Other (See Comments)    Prolonged QT - unable to have zofran  per report   Propoxyphene N-Acetaminophen  Rash     DIAGNOSTIC DATA (LABS, IMAGING, TESTING) - I reviewed patient records, labs, notes, testing and imaging myself where available.  Lab Results  Component Value Date   WBC 9.7 02/11/2024   HGB 14.0 02/11/2024   HCT 41.9 02/11/2024   MCV 86 02/11/2024   PLT 355 02/11/2024      Component Value Date/Time   NA 142 01/28/2024 1549   K 5.0 01/28/2024 1549   K 4.2 02/28/2012 1113   CL 105 01/28/2024 1549   CL 107 02/28/2012 1113   CO2 24 01/28/2024 1549   CO2 23 02/28/2012 1113   GLUCOSE 90 01/28/2024 1549   GLUCOSE 92 08/27/2021 1508   BUN 9 01/28/2024 1549   CREATININE 0.70 01/28/2024 1549   CREATININE 0.73 02/28/2012 1113   CALCIUM  9.8 01/28/2024 1549   CALCIUM  9.5 02/28/2012 1113   PROT 6.3 01/28/2024 1549   PROT 6.4 02/28/2012 1113   ALBUMIN 4.4 01/28/2024 1549   AST 13 01/28/2024 1549   AST 14 02/28/2012 1113   ALT 12 01/28/2024 1549   ALKPHOS 62 01/28/2024 1549   ALKPHOS 49 02/28/2012 1113   BILITOT 0.2 01/28/2024 1549   GFRNONAA >60 08/27/2021 1508   GFRAA 122 11/23/2020 1512   Lab Results  Component Value Date   CHOL 166 11/03/2021   HDL 51 11/03/2021   LDLCALC 95 11/03/2021   TRIG 108 11/03/2021   CHOLHDL 3.3 11/03/2021   No results found for: HGBA1C No results found for: VITAMINB12 Lab Results  Component Value Date   TSH 3.700 12/09/2014    PHYSICAL  EXAM:  Vitals:   08/06/24 0818  BP: 128/80  Pulse: (!) 56  SpO2: 96%   No data found. Body mass index is 40.14 kg/m.   Wt Readings from Last 3 Encounters:  08/06/24 264 lb (119.7 kg)  07/31/24 262 lb (118.8 kg)  01/28/24 250 lb 6.4 oz (113.6 kg)     Ht Readings from Last 3 Encounters:  08/06/24 5' 8 (1.727 m)  07/31/24 5' 8 (1.727 m)  01/28/24 5' 8 (1.727 m)      General: The patient is awake, alert and appears not in acute distress and groomed. Head: Normocephalic, atraumatic.  Neck is supple. Body mass index is 40.93 kg/m.   Generalized: Well developed, in no acute distress ,  Neck size :  16  Mallampati : 3 plus, functional macroglosia.       Neurological examination  Mentation: Alert oriented to time, place, history taking. Follows all commands, her  speech is unaffected 9 no stammer, no dysphonia , no dysarthria ) and language is fluent Cranial nerve:  normal sense of smell and taste -   Extraocular movements were full, without nystagmus and without INO-  visual field were full on confrontational test.  Head turning and shoulder shrug  were normal and symmetric. Hearing intact.  Motor: full strength of all  left extremities with the exception of slightly weaker in the right lower extremity.   Right grip strength is reduced over left.  Sensory: all 10 finger berries are numb to fine touch, vibration was felt in all extremities, ankle and patella and wrist.  Coordination: dysmetria of the right finger to nose maneuver- satellitic.  Gait and station: Patient has a slight limp when ambulating, her right leg is stiffer, the foot is rotated outwards when walking, turing with 4 steps, not 3. Tip toe stand is unsteady.  Poor balance . Wide stance.  Reflexes: Deep tendon reflexes were ore brisk on the right than left , up-going right hallux.    ASSESSMENT AND PLAN :   43 y.o. year old female  here with:    1) secondary progressive MS with relapses.  Has been  suffering from RLS and anxiety, taking zoloft  and Zeposia .  We added magnesium  , PCP has added Vit D and refilled mirapex  bid.  Last MRI brain was last October, we will plan for next October 26 to repeat.   2) obesity affecting  joints, ability to exercise. Not diabetic , no apnea according to HST.   3) neurogenic bladder,  no longer self cath.  Device implanted by urology ( AXONICS )   Ordered labs , CBC diff, CMET and Mag Vit D level.   RV  every 6 months virtual alternating with face to face visits, NP and MD    I would like to thank  Maree Isles, Md [address] [place],  [place] [address] for allowing me to meet with this pleasant patient.      The patient will be seen in follow-up in the Neurological Clinic at [facility] for discussion of test results, CNS  related symptoms and treatment compliance review, further management strategies, etc.   The referring provider will be notified of the test results.   The patient's condition requires frequent monitoring and adjustments in the treatment plan, reflecting the ongoing complexity of care.  This provider is the continuing focal point for all needed services for this condition.  After spending a total time of  35  minutes face to face and time for  history taking, physical and neurologic examination, review of laboratory studies,  personal review of imaging studies, reports and results of other testing and review of referral information / records as far as provided in visit,   Electronically signed by: Dedra Gores, MD 08/06/2024 8:29 AM  [facility] Board certified by The [employer] of Sleep Medicine and Diplomate of the [employer] of Sleep Medicine. Board certified In Neurology through the ABPN, Fellow of the [employer] of Neurology.

## 2024-08-07 ENCOUNTER — Ambulatory Visit: Payer: Self-pay | Admitting: Neurology

## 2024-08-07 LAB — COMPREHENSIVE METABOLIC PANEL WITH GFR
ALT: 14 IU/L (ref 0–32)
AST: 13 IU/L (ref 0–40)
Albumin: 4.3 g/dL (ref 3.9–4.9)
Alkaline Phosphatase: 60 IU/L (ref 41–116)
BUN/Creatinine Ratio: 17 (ref 9–23)
BUN: 11 mg/dL (ref 6–24)
Bilirubin Total: 0.2 mg/dL (ref 0.0–1.2)
CO2: 25 mmol/L (ref 20–29)
Calcium: 9.3 mg/dL (ref 8.7–10.2)
Chloride: 103 mmol/L (ref 96–106)
Creatinine, Ser: 0.64 mg/dL (ref 0.57–1.00)
Globulin, Total: 2.6 g/dL (ref 1.5–4.5)
Glucose: 95 mg/dL (ref 70–99)
Potassium: 4.8 mmol/L (ref 3.5–5.2)
Sodium: 140 mmol/L (ref 134–144)
Total Protein: 6.9 g/dL (ref 6.0–8.5)
eGFR: 111 mL/min/1.73 (ref >59)

## 2024-08-07 LAB — CBC WITH DIFFERENTIAL/PLATELET
Basophils Absolute: 0.1 x10E3/uL (ref 0.0–0.2)
Basos: 1 %
EOS (ABSOLUTE): 0.3 x10E3/uL (ref 0.0–0.4)
Eos: 3 %
Hematocrit: 44 % (ref 34.0–46.6)
Hemoglobin: 14.1 g/dL (ref 11.1–15.9)
Immature Grans (Abs): 0 x10E3/uL (ref 0.0–0.1)
Immature Granulocytes: 0 %
Lymphocytes Absolute: 2.6 x10E3/uL (ref 0.7–3.1)
Lymphs: 31 %
MCH: 26.9 pg (ref 26.6–33.0)
MCHC: 32 g/dL (ref 31.5–35.7)
MCV: 84 fL (ref 79–97)
Monocytes Absolute: 0.5 x10E3/uL (ref 0.1–0.9)
Monocytes: 6 %
Neutrophils Absolute: 4.9 x10E3/uL (ref 1.4–7.0)
Neutrophils: 59 %
Platelets: 301 x10E3/uL (ref 150–450)
RBC: 5.25 x10E6/uL (ref 3.77–5.28)
RDW: 14 % (ref 11.7–15.4)
WBC: 8.3 x10E3/uL (ref 3.4–10.8)

## 2024-08-07 LAB — MAGNESIUM: Magnesium: 2.2 mg/dL (ref 1.6–2.3)

## 2024-08-07 LAB — VITAMIN D 25 HYDROXY (VIT D DEFICIENCY, FRACTURES): Vit D, 25-Hydroxy: 18.1 ng/mL — ABNORMAL LOW (ref 30.0–100.0)

## 2024-08-11 DIAGNOSIS — M51369 Other intervertebral disc degeneration, lumbar region without mention of lumbar back pain or lower extremity pain: Secondary | ICD-10-CM | POA: Insufficient documentation

## 2024-08-14 ENCOUNTER — Encounter: Payer: Self-pay | Admitting: Orthopedic Surgery

## 2024-08-14 ENCOUNTER — Ambulatory Visit (INDEPENDENT_AMBULATORY_CARE_PROVIDER_SITE_OTHER): Admitting: Orthopedic Surgery

## 2024-08-14 VITALS — BP 128/80 | Ht 68.0 in | Wt 264.0 lb

## 2024-08-14 DIAGNOSIS — D179 Benign lipomatous neoplasm, unspecified: Secondary | ICD-10-CM

## 2024-08-14 DIAGNOSIS — R2231 Localized swelling, mass and lump, right upper limb: Secondary | ICD-10-CM

## 2024-08-14 NOTE — Progress Notes (Signed)
 Office Visit Note   Patient: [name]           Date of Birth: 1980-11-01           MRN: [id number] Visit Date: 08/14/2024 Requested by: Maree Isles, MD [address] [place],  [place] [address] PCP: Maree Isles, MD   Assessment & Plan:   43 year old female with intramuscular lipoma involving the supraspinatus muscle right shoulder with pain increasing size  The MRI was not available for our review  The patient is sent to musculoskeletal tumor specialist for excision if they deem necessary  The size of the lesion is 8 cm in 1 dimension and is too large greater than 5 cm for me to remove  Encounter Diagnoses  Name Primary?   Mass of skin of shoulder, right    Intramuscular lipoma, right supraspinatus Yes    No orders of the defined types were placed in this encounter.    Subjective: Chief Complaint  Patient presents with   Shoulder Problem    Right / mass / pain radiates to hand for a year     HPI: 43 year old female (history of mitral valve and tricuspid valve leak) was opening up the left gate about pickup truck it suddenly gave way and she felt pain in her shoulder.  About 2 months later she noticed a mass in her right trapezius muscle and supraspinatus fossa.  Workup included MRI which was done at the [facility] and showed a 8 x 5 cm intramuscular lipoma involving the supraspinatus tendon  Her complaints are of the pain interference with her activities of daily living and pain with forward elevation of the shoulder              ROS: No lymphadenopathy axilla or supraspinatus fossa, we do note some numbness and tingling in the arm and neck pain   Images personally read and my interpretation : Images not available for review  Visit Diagnoses:  1. Intramuscular lipoma, right supraspinatus   2. Mass of skin of shoulder, right      Follow-Up Instructions: No follow-ups on file.    Objective: Vital Signs: BP 128/80 Comment: 08/06/24  Ht 5' 8 (1.727 m)   Wt  264 lb (119.7 kg)   BMI 40.14 kg/m   Physical Exam Vitals and nursing note reviewed.  Constitutional:      General: She is not in acute distress.    Appearance: She is obese. She is not ill-appearing.  HENT:     Head: Normocephalic and atraumatic.  Skin:    General: Skin is warm and dry.  Neurological:     General: No focal deficit present.     Mental Status: She is alert and oriented to person, place, and time.      Right Shoulder Exam   Tenderness  Right shoulder tenderness location: Supraspinatus fossa PERI articular area right shoulder.  Range of Motion  Active abduction:  normal  Passive abduction:  normal  Forward flexion:  normal   Muscle Strength  Abduction: 5/5  Supraspinatus: 5/5   Other  Erythema: absent Scars: absent Sensation: normal Pulse: present  Comments:  Check for lymphadenopathy nothing noted in the axilla or Supraspinatus fossa  Visible and palpable mass in the supraspinatus fossa       Specialty Comments:  No specialty comments available.  Imaging: No results found.   PMFS History: Patient Active Problem List   Diagnosis Date Noted   Degeneration of lumbar intervertebral disc 08/11/2024  Arthralgia of right knee 01/03/2024   Relapsing-remitting multiple sclerosis 11/29/2022   Neurogenic bladder disorder 11/29/2022   RLS (restless legs syndrome) 11/29/2022   Dysesthesia of multiple sites 11/29/2022   Self-catheterizes urinary bladder 01/02/2022   Urinary retention 11/10/2021   Irritability and anger 02/13/2019   Restless legs syndrome (RLS) 02/13/2019   Acute appendicitis    Enteritis 03/25/2018   Sciatica of right side associated with disorder of lumbosacral spine 05/09/2017   Status post lumbar spinal fusion 05/09/2017   Encounter for sterilization education 06/21/2016   Menorrhagia with regular cycle 06/21/2016   Fibroid 04/24/2016   Ovarian cyst 04/24/2016   Menorrhagia with irregular cycle 04/13/2016   Frontal  sinusitis 09/30/2015   Photosensitivity 03/30/2015   Vertigo, aural 03/30/2015   Morbid obesity (HCC) 10/19/2014   Diplopia 10/19/2014   MS (multiple sclerosis) 06/17/2014   Abnormality of gait 06/17/2014   Other malaise and fatigue 10/20/2013   Insomnia 08/06/2013   Mitral insufficiency 05/17/2013   Tricuspid insufficiency 05/17/2013   Multiple sclerosis (HCC) 01/23/2013   Severe obesity (BMI >= 40) (HCC) 01/23/2013   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   Depression with anxiety 01/23/2013   Anemia 02/22/2012   Hematochezia 02/22/2012   Globus sensation 02/22/2012   Dysphagia 02/22/2012   GERD 09/01/2010   CONSTIPATION, CHRONIC 09/01/2010   Past Medical History:  Diagnosis Date   Anxiety    Depression    Enlarged heart    Fibroid 04/24/2016   GERD (gastroesophageal reflux disease)    Leaky heart valve    Low vitamin D  level    17.1   Menorrhagia with irregular cycle 04/13/2016   Multiple sclerosis    Dr Rodman   Multiple sclerosis exacerbation    Numbness in right leg 02/13,12/12   Obesity (BMI 35.0-39.9 without comorbidity)    Other malaise and fatigue 10/20/2013   Ovarian cyst 04/24/2016   Regurgitation    cardiac valve. echo 2013 for first eval GILENYA,mod MR,TR    Family History  Problem Relation Age of Onset   Other Mother        lost leg, has trouble swallowing   Thyroid  nodules Mother    Heart attack Father    Coronary artery disease Father    Diabetes Father    Rheum arthritis Father    Colon cancer Paternal Uncle 62   Colon cancer Paternal Grandmother 65   Diabetes Paternal Grandmother    Cancer Paternal Grandmother        breast,lung   Cancer Maternal Grandmother 40       kind unknown   Diabetes Maternal Grandmother    Myasthenia gravis Maternal Grandfather    Other Sister        tumor on ovary   ADD / ADHD Son    Heart attack Paternal Grandfather    Multiple sclerosis Other        paternal great aunt   Breast cancer Neg Hx      Past Surgical History:  Procedure Laterality Date   BACK SURGERY  2006   CESAREAN SECTION  2000   CHOLECYSTECTOMY  2010   COLONOSCOPY  09/14/2010   Fields-hyperplastic polyps, int hemorrhoids   CYST EXCISION  2006   pilonoidal   DILITATION & CURRETTAGE/HYSTROSCOPY WITH NOVASURE ABLATION N/A 07/21/2016   Procedure: DILATATION & CURETTAGE/HYSTEROSCOPY WITH NOVASURE ABLATION;  Surgeon: Norleen LULLA Server, MD;  Location: [facility];  Service: Gynecology;  Laterality: N/A;  Novasure lenght - 5.0 width - 4.1 power - 113 time -  2 min 03 seconds   ESOPHAGOGASTRODUODENOSCOPY  08/05/2008   Fields-incomplete Schatzki's ring, distal esophageal erosions/esophagitis(mild), small HH, NEGATIVE h pylori   LAPAROSCOPIC APPENDECTOMY N/A 03/26/2018   Procedure: APPENDECTOMY LAPAROSCOPIC;  Surgeon: Mavis Anes, MD;  Location: [facility];  Service: General;  Laterality: N/A;   LAPAROSCOPIC BILATERAL SALPINGECTOMY Bilateral 07/21/2016   Procedure: LAPAROSCOPIC RIGHT SALPINGECTOMY AND LEFT OOPHECTOMY;  Surgeon: Norleen LULLA Server, MD;  Location: [facility];  Service: Gynecology;  Laterality: Bilateral;   LUMBAR DISC SURGERY     US  ECHOCARDIOGRAPHY  03/19/2012   RV mildly dilated,RA mod. dilated,strial septum aneurysmal,Mod. MR & TR   Social History   Occupational History   Occupation: disabled  Tobacco Use   Smoking status: Former    Current packs/day: 0.00    Average packs/day: 1 pack/day for 20.0 years (20.0 ttl pk-yrs)    Types: Cigarettes    Start date: 02/15/1992    Quit date: 02/15/2012    Years since quitting: 12.5   Smokeless tobacco: Never  Vaping Use   Vaping status: Never Used  Substance and Sexual Activity   Alcohol  use: No    Alcohol /week: 0.0 standard drinks of alcohol    Drug use: No   Sexual activity: Never    Birth control/protection: None

## 2024-08-14 NOTE — Progress Notes (Signed)
 Intake history:  Chief Complaint  Patient presents with   Shoulder Problem    Right / mass / pain radiates to hand for a year      BP 128/80 Comment: 08/06/24  Ht 5' 8 (1.727 m)   Wt 264 lb (119.7 kg)   BMI 40.14 kg/m  Body mass index is 40.14 kg/m.  Pharmacy? ___[facility] ___________________________________  WHAT ARE WE SEEING YOU FOR TODAY?  Mass right shoulder c/o pain neck into right arm   How long has this bothered you? (DOI?DOS?WS?)  approximately 1 years(s) ago  Was there an injury? No  Anticoag.  No  Diabetes No  Heart disease there is a mild-moderate leak in the mitral valve and a similar mild-moderate leak in the tricuspid valve   Hypertension No  SMOKING HX No  Kidney disease No  Any ALLERGIES _____________ Allergies  Allergen Reactions   Diclofenac Anaphylaxis   Zofran  [Ondansetron  Hcl] Other (See Comments)    Prolonged QT - unable to have zofran  per report   Acetaminophen     Propoxyphene N-Acetaminophen  Rash   _________________________________   Treatment:  Have you taken:  Tylenol  No  Advil  No  Had PT No  Had injection No  Other  _________________________      MRI Upper Extremity Joint Right W Wo Contrast  Anatomical Region Laterality Modality  Arm right Magnetic Resonance  Elbow -- --  Wrist -- --  Shoulder -- --   Impression  Circumscribed, noncomplex, T1 hyperintense/fatty lesion abutting/arising within the supraspinatus dorsal superior fascia which displaces/deforms the supraspinatus muscle belly and measures approximately 4.9 x 4.4 cm in the sagittal plane and 8.8 cm in longitudinal dimension. No significant internal or surrounding edema signal; no significant postcontrast enhancement. No definite invasion of adjacent structures. Findings are most consistent with a lipoma.  Signed (Electronic Signature): 07/02/2024 4:17 PM Signed By: Dayton Gower, MD Narrative  Exam: MRI upper extremity joint right with  without contrast  History:  Right shoulder lesion, pain with movement, indeterminate ultrasound  Technique: Multisequence, multiplanar MR imaging of the right upper extremity (shoulder) before and after the administration of contrast. Contrast: 10 mL Gadavist  IV  Comparison:  Ultrasound right upper extremity 04/16/2024  Findings: Evaluation partly limited secondary to inhomogeneous fat saturation and signal-to-noise.  OSSEOUS STRUCTURES/JOINTS: Bone marrow signal intensity is within normal limits.  No evidence of stress reaction or fracture. No focal osseous lesion. Alignment is maintained.  Articular cartilage is without focal high-grade defect.  No significant joint effusion.  SOFT TISSUES/TENDONS/MUSCULATURE: No soft tissue fluid collection. Visualized tendon insertions are grossly intact. There is a circumscribed, noncomplex, T1 hyperintense/fatty lesion abutting/arising within the supraspinatus dorsal superior fascia which displaces/deforms the supraspinatus muscle belly and measures approximately 4.9 x 4.4 cm in the sagittal plane and 8.8 cm in longitudinal dimension. No significant internal or surrounding edema signal; no significant postcontrast enhancement. No definite invasion of adjacent structures. There is additionally cranial displacement of the trapezius muscle belly.  NEUROVASCULAR: Unremarkable.  MISCELLANEOUS: None. Procedure Note  Gower Dayton Hoist, MD - 07/02/2024 Formatting of this note might be different from the original. Exam: MRI upper extremity joint right with without contrast  History:  Right shoulder lesion, pain with movement, indeterminate ultrasound  Technique: Multisequence, multiplanar MR imaging of the right upper extremity (shoulder) before and after the administration of contrast. Contrast: 10 mL Gadavist  IV  Comparison:  Ultrasound right upper extremity 04/16/2024  Findings: Evaluation partly limited secondary to inhomogeneous fat saturation and  signal-to-noise.  OSSEOUS  STRUCTURES/JOINTS: Bone marrow signal intensity is within normal limits.  No evidence of stress reaction or fracture. No focal osseous lesion. Alignment is maintained.  Articular cartilage is without focal high-grade defect.  No significant joint effusion.  SOFT TISSUES/TENDONS/MUSCULATURE: No soft tissue fluid collection. Visualized tendon insertions are grossly intact. There is a circumscribed, noncomplex, T1 hyperintense/fatty lesion abutting/arising within the supraspinatus dorsal superior fascia which displaces/deforms the supraspinatus muscle belly and measures approximately 4.9 x 4.4 cm in the sagittal plane and 8.8 cm in longitudinal dimension. No significant internal or surrounding edema signal; no significant postcontrast enhancement. No definite invasion of adjacent structures. There is additionally cranial displacement of the trapezius muscle belly.  NEUROVASCULAR: Unremarkable.  MISCELLANEOUS: None.  IMPRESSION: Circumscribed, noncomplex, T1 hyperintense/fatty lesion abutting/arising within the supraspinatus dorsal superior fascia which displaces/deforms the supraspinatus muscle belly and measures approximately 4.9 x 4.4 cm in the sagittal plane and 8.8 cm in longitudinal dimension. No significant internal or surrounding edema signal; no significant postcontrast enhancement. No definite invasion of adjacent structures. Findings are most consistent with a lipoma.  Signed (Electronic Signature): 07/02/2024 4:17 PM Signed By: Dayton Gower, MD Exam End: 07/02/24 14:43   Specimen Collected: 07/02/24 16:09 Last Resulted: 07/02/24 16:17  Received From: [facility]  Result Received: 07/14/24 12:38

## 2024-08-14 NOTE — Patient Instructions (Signed)
 I will send your referral to [facility]. You can call them to schedule the number is [phone]. They will need you to get a CD of the MRI images from [facility] call the facility that did the MRI to get images

## 2024-08-19 ENCOUNTER — Ambulatory Visit: Admitting: General Surgery

## 2024-08-20 DIAGNOSIS — M7989 Other specified soft tissue disorders: Secondary | ICD-10-CM | POA: Diagnosis not present

## 2024-08-20 DIAGNOSIS — M25511 Pain in right shoulder: Secondary | ICD-10-CM | POA: Diagnosis not present

## 2024-08-20 DIAGNOSIS — G8929 Other chronic pain: Secondary | ICD-10-CM | POA: Diagnosis not present

## 2024-10-28 ENCOUNTER — Encounter: Payer: Self-pay | Admitting: Adult Health

## 2025-01-06 ENCOUNTER — Ambulatory Visit: Admitting: Neurology

## 2025-02-10 ENCOUNTER — Telehealth: Admitting: Adult Health
